# Patient Record
Sex: Female | Born: 1945
Health system: Southern US, Community
[De-identification: ages and names within clinical notes are randomized; demographics above are authoritative.]

## PROBLEM LIST (undated history)

## (undated) DIAGNOSIS — E7800 Pure hypercholesterolemia, unspecified: Secondary | ICD-10-CM

## (undated) DIAGNOSIS — M810 Age-related osteoporosis without current pathological fracture: Secondary | ICD-10-CM

## (undated) DIAGNOSIS — IMO0001 Reserved for inherently not codable concepts without codable children: Secondary | ICD-10-CM

## (undated) DIAGNOSIS — F32A Depression, unspecified: Secondary | ICD-10-CM

## (undated) DIAGNOSIS — F329 Major depressive disorder, single episode, unspecified: Secondary | ICD-10-CM

## (undated) DIAGNOSIS — T4145XA Adverse effect of unspecified anesthetic, initial encounter: Secondary | ICD-10-CM

## (undated) DIAGNOSIS — K219 Gastro-esophageal reflux disease without esophagitis: Secondary | ICD-10-CM

## (undated) DIAGNOSIS — R351 Nocturia: Secondary | ICD-10-CM

## (undated) DIAGNOSIS — R29898 Other symptoms and signs involving the musculoskeletal system: Secondary | ICD-10-CM

## (undated) DIAGNOSIS — E119 Type 2 diabetes mellitus without complications: Secondary | ICD-10-CM

## (undated) DIAGNOSIS — B351 Tinea unguium: Secondary | ICD-10-CM

## (undated) DIAGNOSIS — H3530 Unspecified macular degeneration: Secondary | ICD-10-CM

## (undated) DIAGNOSIS — T8859XA Other complications of anesthesia, initial encounter: Secondary | ICD-10-CM

## (undated) DIAGNOSIS — Z87898 Personal history of other specified conditions: Secondary | ICD-10-CM

## (undated) DIAGNOSIS — Z794 Long term (current) use of insulin: Secondary | ICD-10-CM

## (undated) DIAGNOSIS — L723 Sebaceous cyst: Secondary | ICD-10-CM

## (undated) DIAGNOSIS — M1990 Unspecified osteoarthritis, unspecified site: Secondary | ICD-10-CM

## (undated) DIAGNOSIS — I10 Essential (primary) hypertension: Secondary | ICD-10-CM

## (undated) HISTORY — PX: ORIF WRIST FRACTURE: SHX2133

## (undated) HISTORY — PX: BLADDER SUSPENSION: SHX72

## (undated) HISTORY — PX: CATARACT EXTRACTION W/ INTRAOCULAR LENS  IMPLANT, BILATERAL: SHX1307

## (undated) HISTORY — PX: ABDOMINAL HYSTERECTOMY: SHX81

---

## 2000-01-08 ENCOUNTER — Emergency Department (HOSPITAL_COMMUNITY): Admission: EM | Admit: 2000-01-08 | Discharge: 2000-01-08 | Payer: Self-pay | Admitting: Emergency Medicine

## 2000-01-08 ENCOUNTER — Encounter: Payer: Self-pay | Admitting: Emergency Medicine

## 2000-01-29 ENCOUNTER — Encounter: Admission: RE | Admit: 2000-01-29 | Discharge: 2000-01-29 | Payer: Self-pay | Admitting: Internal Medicine

## 2000-01-29 ENCOUNTER — Encounter: Payer: Self-pay | Admitting: Internal Medicine

## 2000-01-31 ENCOUNTER — Encounter: Admission: RE | Admit: 2000-01-31 | Discharge: 2000-01-31 | Payer: Self-pay | Admitting: Internal Medicine

## 2000-01-31 ENCOUNTER — Encounter: Payer: Self-pay | Admitting: Internal Medicine

## 2002-06-14 ENCOUNTER — Encounter: Admission: RE | Admit: 2002-06-14 | Discharge: 2002-09-12 | Payer: Self-pay | Admitting: Endocrinology

## 2002-08-04 ENCOUNTER — Other Ambulatory Visit: Admission: RE | Admit: 2002-08-04 | Discharge: 2002-08-04 | Payer: Self-pay | Admitting: Obstetrics and Gynecology

## 2003-08-25 ENCOUNTER — Other Ambulatory Visit: Admission: RE | Admit: 2003-08-25 | Discharge: 2003-08-25 | Payer: Self-pay | Admitting: Obstetrics and Gynecology

## 2003-11-15 ENCOUNTER — Ambulatory Visit: Payer: Self-pay | Admitting: Endocrinology

## 2003-12-18 ENCOUNTER — Ambulatory Visit: Payer: Self-pay | Admitting: Endocrinology

## 2004-01-02 ENCOUNTER — Ambulatory Visit: Payer: Self-pay

## 2004-01-17 ENCOUNTER — Ambulatory Visit: Payer: Self-pay | Admitting: Endocrinology

## 2004-01-24 ENCOUNTER — Ambulatory Visit: Payer: Self-pay

## 2004-01-26 ENCOUNTER — Ambulatory Visit: Payer: Self-pay | Admitting: Endocrinology

## 2004-02-08 ENCOUNTER — Ambulatory Visit: Payer: Self-pay

## 2004-02-12 ENCOUNTER — Ambulatory Visit: Payer: Self-pay | Admitting: *Deleted

## 2004-02-22 ENCOUNTER — Ambulatory Visit: Payer: Self-pay | Admitting: Endocrinology

## 2004-04-04 ENCOUNTER — Ambulatory Visit: Payer: Self-pay | Admitting: Endocrinology

## 2004-04-11 ENCOUNTER — Ambulatory Visit: Payer: Self-pay | Admitting: Endocrinology

## 2004-04-15 ENCOUNTER — Ambulatory Visit (HOSPITAL_COMMUNITY): Admission: RE | Admit: 2004-04-15 | Discharge: 2004-04-15 | Payer: Self-pay | Admitting: Endocrinology

## 2004-05-03 ENCOUNTER — Ambulatory Visit: Payer: Self-pay | Admitting: Endocrinology

## 2004-05-07 ENCOUNTER — Ambulatory Visit: Payer: Self-pay | Admitting: Gastroenterology

## 2004-05-07 HISTORY — PX: ESOPHAGOGASTRODUODENOSCOPY: SHX1529

## 2004-05-16 ENCOUNTER — Ambulatory Visit: Payer: Self-pay | Admitting: Endocrinology

## 2004-06-17 ENCOUNTER — Ambulatory Visit: Payer: Self-pay | Admitting: Endocrinology

## 2004-07-08 ENCOUNTER — Ambulatory Visit: Payer: Self-pay | Admitting: Endocrinology

## 2004-08-13 ENCOUNTER — Ambulatory Visit: Payer: Self-pay | Admitting: Endocrinology

## 2004-10-29 ENCOUNTER — Ambulatory Visit: Payer: Self-pay | Admitting: Endocrinology

## 2004-11-13 ENCOUNTER — Ambulatory Visit: Payer: Self-pay | Admitting: Endocrinology

## 2004-11-20 ENCOUNTER — Ambulatory Visit: Payer: Self-pay | Admitting: Cardiology

## 2004-12-03 ENCOUNTER — Ambulatory Visit: Payer: Self-pay | Admitting: Endocrinology

## 2004-12-24 ENCOUNTER — Ambulatory Visit: Payer: Self-pay | Admitting: Endocrinology

## 2005-01-06 ENCOUNTER — Ambulatory Visit: Payer: Self-pay | Admitting: Internal Medicine

## 2005-04-15 ENCOUNTER — Ambulatory Visit: Payer: Self-pay | Admitting: Endocrinology

## 2005-04-18 ENCOUNTER — Ambulatory Visit: Payer: Self-pay | Admitting: Endocrinology

## 2005-06-10 ENCOUNTER — Ambulatory Visit: Payer: Self-pay | Admitting: Endocrinology

## 2005-11-14 ENCOUNTER — Ambulatory Visit: Payer: Self-pay | Admitting: Endocrinology

## 2005-11-14 LAB — CONVERTED CEMR LAB
Albumin: 3.6 g/dL (ref 3.5–5.2)
BUN: 9 mg/dL (ref 6–23)
Bacteria, U Microscopic: NEGATIVE /hpf
Basophils Absolute: 0 10*3/uL (ref 0.0–0.1)
Basophils Relative: 0.3 % (ref 0.0–1.0)
CO2: 30 meq/L (ref 19–32)
Chol/HDL Ratio, serum: 2.9
Creatinine,U: 48.5 mg/dL
Crystals: NEGATIVE
HCT: 36.8 % (ref 36.0–46.0)
HDL: 44.9 mg/dL (ref >39.0)
Hgb A1c MFr Bld: 12.2 % — ABNORMAL HIGH (ref 4.6–6.0)
Ketones, ur: NEGATIVE mg/dL
LDL Cholesterol: 73 mg/dL (ref 0–99)
Leukocytes, UA: NEGATIVE
Lymphocytes Relative: 35.8 % (ref 12.0–46.0)
MCV: 86.9 fL (ref 78.0–100.0)
Microalb Creat Ratio: 14.4 mg/g (ref 0.0–30.0)
Microalb, Ur: 0.7 mg/dL (ref 0.0–1.9)
Monocytes Absolute: 0.7 10*3/uL (ref 0.2–0.7)
Neutro Abs: 6.4 10*3/uL (ref 1.4–7.7)
Nitrite: NEGATIVE
RBC / HPF: NONE SEEN
Sodium: 140 meq/L (ref 135–145)
TSH: 1.09 microintl units/mL (ref 0.35–5.50)
Total Bilirubin: 0.5 mg/dL (ref 0.3–1.2)
Total Protein, Urine: NEGATIVE mg/dL
Total Protein: 6.3 g/dL (ref 6.0–8.3)
Urobilinogen, UA: 0.2 (ref 0.0–1.0)

## 2005-11-26 ENCOUNTER — Ambulatory Visit: Payer: Self-pay | Admitting: Endocrinology

## 2005-12-09 ENCOUNTER — Ambulatory Visit: Payer: Self-pay | Admitting: Internal Medicine

## 2006-02-02 ENCOUNTER — Ambulatory Visit: Payer: Self-pay | Admitting: Endocrinology

## 2006-03-16 ENCOUNTER — Ambulatory Visit: Payer: Self-pay | Admitting: Endocrinology

## 2006-04-14 ENCOUNTER — Ambulatory Visit: Payer: Self-pay | Admitting: Endocrinology

## 2006-06-05 ENCOUNTER — Ambulatory Visit: Payer: Self-pay | Admitting: Endocrinology

## 2006-06-26 ENCOUNTER — Encounter: Payer: Self-pay | Admitting: Endocrinology

## 2006-06-26 DIAGNOSIS — F329 Major depressive disorder, single episode, unspecified: Secondary | ICD-10-CM | POA: Insufficient documentation

## 2006-06-26 DIAGNOSIS — M1990 Unspecified osteoarthritis, unspecified site: Secondary | ICD-10-CM | POA: Insufficient documentation

## 2006-06-26 DIAGNOSIS — K219 Gastro-esophageal reflux disease without esophagitis: Secondary | ICD-10-CM

## 2006-06-26 DIAGNOSIS — I10 Essential (primary) hypertension: Secondary | ICD-10-CM | POA: Insufficient documentation

## 2006-06-26 DIAGNOSIS — M810 Age-related osteoporosis without current pathological fracture: Secondary | ICD-10-CM

## 2006-08-21 ENCOUNTER — Ambulatory Visit: Payer: Self-pay | Admitting: Endocrinology

## 2006-08-29 LAB — CONVERTED CEMR LAB: Pap Smear: NORMAL

## 2006-10-07 ENCOUNTER — Ambulatory Visit: Payer: Self-pay | Admitting: Gastroenterology

## 2006-11-19 ENCOUNTER — Ambulatory Visit: Payer: Self-pay | Admitting: Gastroenterology

## 2006-11-19 ENCOUNTER — Ambulatory Visit: Payer: Self-pay | Admitting: Endocrinology

## 2006-11-23 ENCOUNTER — Telehealth: Payer: Self-pay | Admitting: Endocrinology

## 2006-11-23 LAB — CONVERTED CEMR LAB
AST: 14 units/L (ref 0–37)
Bilirubin, Direct: 0.1 mg/dL (ref 0.0–0.3)
Chloride: 102 meq/L (ref 96–112)
Cholesterol: 216 mg/dL (ref 0–200)
Creatinine, Ser: 0.7 mg/dL (ref 0.4–1.2)
Creatinine,U: 67.7 mg/dL
Direct LDL: 163.1 mg/dL
Eosinophils Absolute: 0.2 10*3/uL (ref 0.0–0.6)
GFR calc non Af Amer: 91 mL/min
HDL: 42.1 mg/dL (ref >39.0)
Hemoglobin, Urine: NEGATIVE
Hemoglobin: 13.4 g/dL (ref 12.0–15.0)
Hgb A1c MFr Bld: 10 % — ABNORMAL HIGH (ref 4.6–6.0)
Lymphocytes Relative: 29.8 % (ref 12.0–46.0)
MCV: 86.3 fL (ref 78.0–100.0)
Microalb, Ur: 1 mg/dL (ref 0.0–1.9)
Mucus, UA: NEGATIVE
Neutro Abs: 7 10*3/uL (ref 1.4–7.7)
Neutrophils Relative %: 62.4 % (ref 43.0–77.0)
Nitrite: NEGATIVE
Platelets: 373 10*3/uL (ref 150–400)
Sodium: 137 meq/L (ref 135–145)
Specific Gravity, Urine: 1.02 (ref 1.000–1.03)
Total CHOL/HDL Ratio: 5.1
Urine Glucose: 250 mg/dL — AB
Urobilinogen, UA: 0.2 (ref 0.0–1.0)
WBC: 11.3 10*3/uL — ABNORMAL HIGH (ref 4.5–10.5)

## 2006-12-01 ENCOUNTER — Encounter: Payer: Self-pay | Admitting: Gastroenterology

## 2006-12-01 ENCOUNTER — Ambulatory Visit: Payer: Self-pay | Admitting: Gastroenterology

## 2006-12-21 ENCOUNTER — Ambulatory Visit: Payer: Self-pay | Admitting: Internal Medicine

## 2006-12-21 DIAGNOSIS — M549 Dorsalgia, unspecified: Secondary | ICD-10-CM | POA: Insufficient documentation

## 2006-12-22 ENCOUNTER — Ambulatory Visit: Payer: Self-pay | Admitting: Gastroenterology

## 2006-12-22 ENCOUNTER — Encounter: Payer: Self-pay | Admitting: Gastroenterology

## 2006-12-22 DIAGNOSIS — E1169 Type 2 diabetes mellitus with other specified complication: Secondary | ICD-10-CM | POA: Insufficient documentation

## 2006-12-22 DIAGNOSIS — K449 Diaphragmatic hernia without obstruction or gangrene: Secondary | ICD-10-CM | POA: Insufficient documentation

## 2006-12-22 DIAGNOSIS — K210 Gastro-esophageal reflux disease with esophagitis: Secondary | ICD-10-CM

## 2006-12-22 DIAGNOSIS — K2270 Barrett's esophagus without dysplasia: Secondary | ICD-10-CM

## 2006-12-22 DIAGNOSIS — J309 Allergic rhinitis, unspecified: Secondary | ICD-10-CM | POA: Insufficient documentation

## 2006-12-22 DIAGNOSIS — D1330 Benign neoplasm of unspecified part of small intestine: Secondary | ICD-10-CM

## 2006-12-22 DIAGNOSIS — G4733 Obstructive sleep apnea (adult) (pediatric): Secondary | ICD-10-CM

## 2006-12-22 DIAGNOSIS — E785 Hyperlipidemia, unspecified: Secondary | ICD-10-CM

## 2007-01-04 ENCOUNTER — Ambulatory Visit: Payer: Self-pay | Admitting: Gastroenterology

## 2007-05-05 ENCOUNTER — Telehealth: Payer: Self-pay | Admitting: Endocrinology

## 2007-08-24 ENCOUNTER — Ambulatory Visit: Payer: Self-pay | Admitting: Internal Medicine

## 2007-10-28 LAB — HM COLONOSCOPY: HM Colonoscopy: NORMAL

## 2007-12-27 ENCOUNTER — Ambulatory Visit: Payer: Self-pay | Admitting: Endocrinology

## 2007-12-28 LAB — CONVERTED CEMR LAB
ALT: 15 units/L (ref 0–35)
Albumin: 3.5 g/dL (ref 3.5–5.2)
BUN: 15 mg/dL (ref 6–23)
Bilirubin Urine: NEGATIVE
Bilirubin, Direct: 0.1 mg/dL (ref 0.0–0.3)
CO2: 24 meq/L (ref 19–32)
Calcium: 9 mg/dL (ref 8.4–10.5)
Creatinine, Ser: 0.5 mg/dL (ref 0.4–1.2)
Creatinine,U: 35.5 mg/dL
Crystals: NEGATIVE
Eosinophils Absolute: 0.2 10*3/uL (ref 0.0–0.7)
Eosinophils Relative: 1.4 % (ref 0.0–5.0)
Glucose, Bld: 394 mg/dL — ABNORMAL HIGH (ref 70–99)
HCT: 42.3 % (ref 36.0–46.0)
Hemoglobin: 14.7 g/dL (ref 12.0–15.0)
Ketones, ur: NEGATIVE mg/dL
Lymphocytes Relative: 31.7 % (ref 12.0–46.0)
MCHC: 34.7 g/dL (ref 30.0–36.0)
Microalb Creat Ratio: 62 mg/g — ABNORMAL HIGH (ref 0.0–30.0)
Microalb, Ur: 2.2 mg/dL — ABNORMAL HIGH (ref 0.0–1.9)
Monocytes Absolute: 0.6 10*3/uL (ref 0.1–1.0)
Neutro Abs: 6.7 10*3/uL (ref 1.4–7.7)
Neutrophils Relative %: 60.4 % (ref 43.0–77.0)
Phosphorus: 3.7 mg/dL (ref 2.3–4.6)
Potassium: 4.1 meq/L (ref 3.5–5.1)
RBC: 4.91 M/uL (ref 3.87–5.11)
RDW: 11.5 % (ref 11.5–14.6)
Sodium: 134 meq/L — ABNORMAL LOW (ref 135–145)
Total Bilirubin: 0.6 mg/dL (ref 0.3–1.2)
Total CHOL/HDL Ratio: 4.5
Triglycerides: 116 mg/dL (ref 0–149)
pH: 5.5 (ref 5.0–8.0)

## 2008-02-23 ENCOUNTER — Ambulatory Visit: Payer: Self-pay | Admitting: Endocrinology

## 2008-02-28 ENCOUNTER — Ambulatory Visit: Payer: Self-pay | Admitting: Internal Medicine

## 2008-02-28 ENCOUNTER — Encounter: Payer: Self-pay | Admitting: Endocrinology

## 2008-03-08 ENCOUNTER — Telehealth: Payer: Self-pay | Admitting: Gastroenterology

## 2008-03-28 ENCOUNTER — Ambulatory Visit: Payer: Self-pay | Admitting: Gastroenterology

## 2008-06-12 ENCOUNTER — Ambulatory Visit: Payer: Self-pay | Admitting: Endocrinology

## 2008-06-19 ENCOUNTER — Telehealth: Payer: Self-pay | Admitting: Endocrinology

## 2008-06-28 ENCOUNTER — Encounter: Payer: Self-pay | Admitting: Endocrinology

## 2008-07-07 ENCOUNTER — Telehealth (INDEPENDENT_AMBULATORY_CARE_PROVIDER_SITE_OTHER): Payer: Self-pay | Admitting: *Deleted

## 2008-07-25 ENCOUNTER — Encounter: Admission: RE | Admit: 2008-07-25 | Discharge: 2008-07-25 | Payer: Self-pay | Admitting: Internal Medicine

## 2008-08-01 ENCOUNTER — Telehealth (INDEPENDENT_AMBULATORY_CARE_PROVIDER_SITE_OTHER): Payer: Self-pay | Admitting: *Deleted

## 2008-08-03 ENCOUNTER — Ambulatory Visit: Payer: Self-pay | Admitting: Endocrinology

## 2008-08-04 LAB — CONVERTED CEMR LAB
Calcium: 9 mg/dL (ref 8.4–10.5)
Magnesium: 2.2 mg/dL (ref 1.5–2.5)

## 2008-08-09 ENCOUNTER — Telehealth (INDEPENDENT_AMBULATORY_CARE_PROVIDER_SITE_OTHER): Payer: Self-pay | Admitting: *Deleted

## 2008-08-18 ENCOUNTER — Ambulatory Visit (HOSPITAL_COMMUNITY): Admission: RE | Admit: 2008-08-18 | Discharge: 2008-08-18 | Payer: Self-pay | Admitting: Endocrinology

## 2008-09-04 ENCOUNTER — Telehealth: Payer: Self-pay | Admitting: Gastroenterology

## 2008-09-11 ENCOUNTER — Ambulatory Visit: Payer: Self-pay | Admitting: Endocrinology

## 2008-12-18 ENCOUNTER — Ambulatory Visit: Payer: Self-pay | Admitting: Endocrinology

## 2008-12-18 DIAGNOSIS — R809 Proteinuria, unspecified: Secondary | ICD-10-CM | POA: Insufficient documentation

## 2008-12-18 LAB — CONVERTED CEMR LAB
Hgb A1c MFr Bld: 14.8 % — ABNORMAL HIGH (ref 4.6–6.5)
Microalb Creat Ratio: 122.1 mg/g — ABNORMAL HIGH (ref 0.0–30.0)

## 2009-03-14 ENCOUNTER — Ambulatory Visit: Payer: Self-pay | Admitting: Endocrinology

## 2009-03-14 ENCOUNTER — Encounter (INDEPENDENT_AMBULATORY_CARE_PROVIDER_SITE_OTHER): Payer: Self-pay | Admitting: *Deleted

## 2009-03-14 LAB — CONVERTED CEMR LAB
AST: 16 units/L (ref 0–37)
BUN: 16 mg/dL (ref 6–23)
Basophils Absolute: 0 10*3/uL (ref 0.0–0.1)
Bilirubin Urine: NEGATIVE
Bilirubin, Direct: 0.1 mg/dL (ref 0.0–0.3)
Chloride: 97 meq/L (ref 96–112)
Cholesterol: 257 mg/dL — ABNORMAL HIGH (ref 0–200)
Direct LDL: 185.6 mg/dL
Eosinophils Absolute: 0.1 10*3/uL (ref 0.0–0.7)
Eosinophils Relative: 1 % (ref 0.0–5.0)
GFR calc non Af Amer: 90.15 mL/min (ref >60)
HDL: 51.8 mg/dL (ref >39.00)
Hemoglobin: 13.1 g/dL (ref 12.0–15.0)
Leukocytes, UA: NEGATIVE
Lymphocytes Relative: 31.8 % (ref 12.0–46.0)
MCV: 88.4 fL (ref 78.0–100.0)
Monocytes Relative: 7 % (ref 3.0–12.0)
Neutro Abs: 6.5 10*3/uL (ref 1.4–7.7)
Nitrite: NEGATIVE
Platelets: 377 10*3/uL (ref 150.0–400.0)
Potassium: 4.8 meq/L (ref 3.5–5.1)
Sodium: 133 meq/L — ABNORMAL LOW (ref 135–145)
TSH: 1.82 microintl units/mL (ref 0.35–5.50)
Total CHOL/HDL Ratio: 5
Total Protein, Urine: NEGATIVE mg/dL
Urine Glucose: >=1000 mg/dL
Urobilinogen, UA: 0.2 (ref 0.0–1.0)
VLDL: 27.8 mg/dL (ref 0.0–40.0)
WBC: 10.7 10*3/uL — ABNORMAL HIGH (ref 4.5–10.5)

## 2009-03-19 ENCOUNTER — Ambulatory Visit: Payer: Self-pay | Admitting: Endocrinology

## 2009-04-18 ENCOUNTER — Ambulatory Visit: Payer: Self-pay | Admitting: Endocrinology

## 2009-06-07 ENCOUNTER — Telehealth: Payer: Self-pay | Admitting: Endocrinology

## 2009-06-13 ENCOUNTER — Telehealth: Payer: Self-pay | Admitting: Endocrinology

## 2009-06-18 ENCOUNTER — Observation Stay (HOSPITAL_COMMUNITY): Admission: EM | Admit: 2009-06-18 | Discharge: 2009-06-18 | Payer: Self-pay | Admitting: Emergency Medicine

## 2009-06-20 ENCOUNTER — Ambulatory Visit: Payer: Self-pay | Admitting: Endocrinology

## 2009-07-12 ENCOUNTER — Telehealth (INDEPENDENT_AMBULATORY_CARE_PROVIDER_SITE_OTHER): Payer: Self-pay | Admitting: *Deleted

## 2009-07-18 ENCOUNTER — Ambulatory Visit: Payer: Self-pay | Admitting: Endocrinology

## 2009-07-23 ENCOUNTER — Ambulatory Visit: Payer: Self-pay | Admitting: Internal Medicine

## 2009-07-23 LAB — CONVERTED CEMR LAB
Blood Glucose, Fingerstick: 97
Hgb A1c MFr Bld: 12.7 % — ABNORMAL HIGH (ref 4.6–6.5)

## 2009-10-11 ENCOUNTER — Encounter (INDEPENDENT_AMBULATORY_CARE_PROVIDER_SITE_OTHER): Payer: Self-pay | Admitting: *Deleted

## 2009-12-10 ENCOUNTER — Telehealth (INDEPENDENT_AMBULATORY_CARE_PROVIDER_SITE_OTHER): Payer: Self-pay | Admitting: *Deleted

## 2010-01-07 ENCOUNTER — Telehealth: Payer: Self-pay | Admitting: Endocrinology

## 2010-02-17 ENCOUNTER — Encounter: Payer: Self-pay | Admitting: Gastroenterology

## 2010-02-26 NOTE — Assessment & Plan Note (Signed)
Summary: 2 MTH FU--STC   Vital Signs:  Patient profile:   65 year old female Height:      62 inches (157.48 cm) Weight:      139.38 pounds (63.35 kg) BMI:     25.59 O2 Sat:      97 % on Room air Temp:     98.6 degrees F (37.00 degrees C) oral Pulse rate:   94 / minute BP sitting:   142 / 84  (left arm) Cuff size:   regular  Vitals Entered By: Josph Macho RMA (Jun 20, 2009 8:16 AM)  O2 Flow:  Room air CC: 2 month follow up/ CF Is Patient Diabetic? Yes   Primary Provider:  Romero Belling, MD  CC:  2 month follow up/ CF.  History of Present Illness: pt was seen in er 2 days ago for severe hyperglycemia.  she has lost a few more lbs.  she says she sometimes skips insulin. she also had a few minutes of severe involuntary twisting of the neck and associated eyes rolling back.   she says she was not taking any medication for nausea, but 2 weeks ago she was rx'ed an uncertain medication (for "nerves") for neck pain, at [facility].  she no longer takes this med. she also continues to have depression, despite the effexor.      Current Medications (verified): 1)  Glucophage 1000 Mg Tabs (Metformin Hcl) .... Take 2 Tablets By Mouth At Bedtime, For Diabetes 2)  Oxybutynin Chloride 5 Mg Tabs (Oxybutynin Chloride) .Marland Kitchen.. 1 By Mouth Two Times A Day, For Urinary Leakage 3)  Effexor Xr 75 Mg  Cp24 (Venlafaxine Hcl) .... Take 3 Tablet By Mouth Once Daily, For Depression 4)  Zegerid 40-1100 Mg  Caps (Omeprazole-Sodium Bicarbonate) .... Take 1 Tablet By Mouth Two Times A Day, For Heartburn 5)  Lantus Solostar 100 Unit/ml Soln (Insulin Glargine) .... 80 Units Once Daily, and Pen Needles Once Daily 6)  Venlafaxine Hcl 150 Mg Xr24h-Tab (Venlafaxine Hcl) .... 2 Qd 7)  Crestor 40 Mg Tabs (Rosuvastatin Calcium) .Marland Kitchen.. 1 Qd 8)  Losartan Potassium 50 Mg Tabs (Losartan Potassium) .Marland Kitchen.. 1 Once Daily.  Cancel Rx For Labetalol Just Sent 9)  Onetouch Ultra Test  Strp (Glucose Blood) .... Two Times A Day, and  Lancets 250.01  Allergies (verified): 1)  ! 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         denies suicidal thoughts.    Physical Exam  General:  normal appearance.   Psych:  depressed affect.     Impression & Recommendations:  Problem # 1:  DIABETES MELLITUS, TYPE II (ICD-250.00) therapy limited by noncompliance.  i'll do the best i can.  Problem # 2:  acute dystonia uncertain etiology.  resolved.  Problem # 3:  DEPRESSION (ICD-311) needs increased rx  Other Orders: Psychiatric Referral (Psych) Est. Patient Level IV (04540)  Patient Instructions: 1)  it is critically important to take insulin and other medications as prescribed. 2)  check your blood sugar 2 times a day.  vary the time of day when you check, between before the 3 meals, and at bedtime.  also check if you have symptoms of your blood sugar being too high or too low.  please keep a  record of the readings and bring it to your next appointment here.  please call us sooner if you are having low blood sugar episodes. 3)  refer psychiatry.  you will be called with a day and time for an appointment. 4)  return here 3 weeks. 5)  i'll do off work form.  she missed from 06/18/09, and return 06/26/09.  she missed parts of days 06/11/09 to 06/18/09.

## 2010-02-26 NOTE — Letter (Signed)
Summary: Endo/Colon Letter  [facility]  [address] [place], [place] [address]   Phone: [phone]  Fax: [phone]      October 11, 2009 MRN: [id number]   [facility] [address] [place], [place]  [address]   Dear Ms. [facility],   According to your medical record, it is time for you to schedule an Endoscopy/Colonoscopy . Endoscopic screeening is recommended for patients with certain upper digestive tract conditions because of associated increased risk for cancers of the upper digestive system. The American Cancer Society recommends Colonoscopy as a method to detect early colon cancer. Patients with a family history of colon cancer, or a personal history of colon polyps or inflammatory bowel disease are at increased risk.  This letter has been generated based on the recommendations made at the time of your prior procedure. If you feel that in your particular situation this may no longer apply, please contact our office.  Please call our office at [phone] to schedule this appointment or to update your records at your earliest convenience.  Thank you for cooperating with [place] to provide you with the very best care possible.   Sincerely,  Judie Petit T. Russella Dar, M.D.  [facility] Gastroenterology Division [phone]

## 2010-02-26 NOTE — Assessment & Plan Note (Signed)
Summary: 3 MTH PHYSICAL--STC   Vital Signs:  Patient profile:   65 year old female Height:      62 inches (157.48 cm) Weight:      141.25 pounds (64.20 kg) O2 Sat:      95 % on Room air Temp:     97.1 degrees F (36.17 degrees C) oral Pulse rate:   92 / minute BP sitting:   148 / 80  (left arm) Cuff size:   regular  Vitals Entered By: Josph Macho RMA (March 19, 2009 7:59 AM)  O2 Flow:  Room air CC: 3 month physical/ CF Is Patient Diabetic? Yes   Primary Provider:  Romero Belling, MD  CC:  3 month physical/ CF.  History of Present Illness: here for regular wellness examination.  He's feeling pretty well in general, and does not drink etoh.   Current Medications (verified): 1)  Glucophage 1000 Mg Tabs (Metformin Hcl) .... Take 2 Tablets By Mouth At Bedtime, For Diabetes 2)  Labetalol Hcl 100 Mg Tabs (Labetalol Hcl) .... 1/2 By Mouth Two Times A Day, For Blood Pressure 3)  Oxybutynin Chloride 5 Mg Tabs (Oxybutynin Chloride) .Marland Kitchen.. 1 By Mouth Two Times A Day, For Urinary Leakage 4)  Vytorin 10-80 Mg Tabs (Ezetimibe-Simvastatin) .Marland Kitchen.. 1 By Mouth At Bedtime, For Cholesterol 5)  Effexor Xr 75 Mg  Cp24 (Venlafaxine Hcl) .... Take 3 Tablet By Mouth Once Daily, For Depression 6)  Zegerid 40-1100 Mg  Caps (Omeprazole-Sodium Bicarbonate) .... Take 1 Tablet By Mouth Two Times A Day, For Heartburn 7)  Lantus Solostar 100 Unit/ml Soln (Insulin Glargine) .... 65 Units Once Daily, For Diabetes 8)  Venlafaxine Hcl 150 Mg Xr24h-Tab (Venlafaxine Hcl) .... 2 Qd  Allergies (verified): 1)  ! Novocain  Family History: Reviewed history from 03/28/2008 and no changes required. Family History Diabetes 1st degree relative: Sister x 2, Brother   Family History High cholesterol Family History Hypertension Family History of Stroke M 1st degree relative - father Family History of Stroke F 1st degree relative <60 - sister mother had "female cancer" No FH of Colon Cancer: Family History of Ovarian  Cancer: Mother  Social History: Reviewed history from 06/12/2008 and no changes required. Current Smoker-10 cigarettes daily Alcohol use-no widowed Daily Caffeine Use-2 cups daily or more Illicit Drug Use - no Patient does not get regular exercise.  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  Review of Systems  The patient denies fever, weight loss, weight gain, decreased hearing, chest pain, syncope, prolonged cough, headaches, abdominal pain, melena, hematochezia, severe indigestion/heartburn, hematuria, suspicious skin lesions, and depression.    Physical Exam  General:  normal appearance.   Head:  head: no deformity eyes: no periorbital swelling, no proptosis external nose and ears are normal mouth: no lesion seen Neck:  Supple without thyroid enlargement or tenderness.  Breasts:  sees gyn  Heart:  Regular rate and rhythm without murmurs or gallops noted. Normal S1,S2.   Abdomen:  abdomen is soft, nontender.  no hepatosplenomegaly.   not distended.  no hernia  Rectal:  sees gyn  Genitalia:  sees gyn  Msk:  muscle bulk and strength are grossly normal.  no obvious joint swelling.  gait is normal and steady  Pulses:  dorsalis pedis intact bilat.  no carotid bruit  Neurologic:  cn 2-12 grossly intact.   readily moves all 4's.   sensation is intact to touch on the feet  Skin:  normal texture and temp.  no rash.  not diaphoretic  Cervical Nodes:  No significant adenopathy.  Psych:  Alert and cooperative; normal mood and affect; normal attention span and concentration.   Additional Exam:  SEPARATE EVALUATION FOLLOWS--EACH PROBLEM HERE IS NEW, NOT RESPONDING TO TREATMENT, OR POSES SIGNIFICANT RISK TO THE PATIENT'S HEALTH: HISTORY OF THE PRESENT ILLNESS: no cbg record, but states cbg's are "high."  she has slight blurry vision pt says she takes and tolerates vytorin well. she has slight wheezing. PAST MEDICAL HISTORY reviewed and up to date today REVIEW OF SYSTEMS: denies  hypoglycemia and doe PHYSICAL EXAMINATION: see vs page no deformity.  no ulcer on the feet.  feet are of normal color and temp.  no edema chest: has few exp wheezes LAB/XRAY RESULTS: Hemoglobin A1C   13.7 %                    Cholesterol LDL   185.6 mg/dL IMPRESSION: dyslipidemia, needs increased rx wheezing is prob exacerbated by labetalol dm, needs increased rx htn, needs increased rx PLAN: see instruction sheet   Impression & Recommendations:  Problem # 1:  ROUTINE GENERAL MEDICAL EXAM@HEALTH  CARE FACL (ICD-V70.0)  Medications Added to Medication List This Visit: 1)  Labetalol Hcl 100 Mg Tabs (Labetalol hcl) .Marland Kitchen.. 1 by mouth two times a day, for blood pressure 2)  Lantus Solostar 100 Unit/ml Soln (Insulin glargine) .... 80 units once daily, for diabetes 3)  Crestor 40 Mg Tabs (Rosuvastatin calcium) .Marland Kitchen.. 1 qd 4)  Losartan Potassium 50 Mg Tabs (Losartan potassium) .Marland Kitchen.. 1 once daily.  cancel rx for labetalol just sent  Other Orders: EKG w/ Interpretation (93000) Est. Patient Level IV (25366) Est. Patient 40-64 years (44034)  Preventive Care Screening     gyn is dr Tenny Craw, who does mammography   Patient Instructions: 1)  increase lantus to 80 units once daily 2)  check your blood sugar 2 times a day.  vary the time of day when you check, between before the 3 meals, and at bedtime.  also check if you have symptoms of your blood sugar being too high or too low.  please keep a record of the readings and bring it to your next appointment here.  please call us sooner if you are having low blood sugar episodes. 3)  Please schedule a follow-up appointment in 1 month. 4)  change vytorin to crestor 40 mg once daily 5)  stop labetalol. 6)  start losartan 50 mg once daily. 7)  we discussed the recommendations of the preventive services task force. Prescriptions: LOSARTAN POTASSIUM 50 MG TABS (LOSARTAN POTASSIUM) 1 once daily.  cancel rx for labetalol just sent  #30 x 11   Entered and  Authorized by:   Minus Breeding MD   Signed by:   Minus Breeding MD on 03/19/2009   Method used:   Electronically to        [facility]  [address] [id number] (retail)       [address]       [place], [place]  [address]       Ph: [phone] or [phone]       Fax: [phone]   RxID:   [id number] LABETALOL HCL 100 MG TABS (LABETALOL HCL) 1 by mouth two times a day, for blood pressure  #60 x 11   Entered and Authorized by:   Minus Breeding MD   Signed by:   Minus Breeding MD on 03/19/2009   Method used:  Electronically to        [facility]  [address] [id number] ([facility]ail[facility]       [address]       [place], [place]  [address]       Ph: [phone] or [phone]       Fax: [phone]   RxID:   [id number] CRESTOR 40 MG TABS (ROSUVASTATIN CALCIUM) 1 qd  #30 x 11   Entered and Authorized by:   Minus Breeding MD   Signed by:   Minus Breeding MD on 03/19/2009   Method used:   [facility]ically to        [facility]  [address] [id number]       [address]       [place], [place]  [address]       Ph: [phone] or [phone]       Fax: [phone]   RxID:   [id number]    Preventive Care Screening     gyn is dr Tenny Craw, who does mammography

## 2010-02-26 NOTE — Letter (Signed)
Summary: Clearance/[facility]  [facility]   Imported By: Lester Glenshaw 07/25/2009 11:21:21  _____________________________________________________________________  External Attachment:    Type:   Image     Comment:   External Document

## 2010-02-26 NOTE — Progress Notes (Signed)
  Phone Note Other Incoming   Request: Send information Summary of Call: patient records request forwarded to [employer].

## 2010-02-26 NOTE — Letter (Signed)
Summary: Office Visit Letter  [facility]  [address] [place], [place] [address]   Phone: [phone]  Fax: [phone]      March 14, 2009 MRN: [id number]   [facility] [address] [place], [place]  [address]   Dear Ms. [place],   According to our records, it is time for you to schedule a follow-up office visit with [place].   At your convenience, please call [phone] (option #2)to schedule an office visit. If you have any questions, concerns, or feel that this letter is in error, we would appreciate your call.   Sincerely,  Judie Petit T. Russella Dar, M.D.  [facility] Gastroenterology Division [phone]

## 2010-02-26 NOTE — Progress Notes (Signed)
  Phone Note Other Incoming   Request: Send information Summary of Call: Request for records received from [facility]. Request forwarded to [employer].     

## 2010-02-26 NOTE — Assessment & Plan Note (Signed)
Summary: 1 MTH FU---STC   Vital Signs:  Patient profile:   65 year old female Height:      62 inches (157.48 cm) Weight:      147.50 pounds (67.05 kg) O2 Sat:      95 % on Room air Temp:     97.3 degrees F (36.28 degrees C) oral Pulse rate:   84 / minute BP sitting:   138 / 60  (left arm) Cuff size:   regular  Vitals Entered By: Josph Macho RMA (April 18, 2009 8:06 AM)  O2 Flow:  Room air CC: 1 month follow up/ CF Is Patient Diabetic? Yes   Primary Provider:  Romero Belling, MD  CC:  1 month follow up/ CF.  History of Present Illness: the status of 3 chronic medical problems is addressed today: dm:  pt says she has had only 1 episode of hypoglycemia since last ov, and this was mild.  it was in the early am, after her overnight  shift. htn:  she takes and tolerates bp meds as rx'ed. dyslipidemia:  she takes crestor as rx'ed.  Current Medications (verified): 1)  Glucophage 1000 Mg Tabs (Metformin Hcl) .... Take 2 Tablets By Mouth At Bedtime, For Diabetes 2)  Oxybutynin Chloride 5 Mg Tabs (Oxybutynin Chloride) .Marland Kitchen.. 1 By Mouth Two Times A Day, For Urinary Leakage 3)  Vytorin 10-80 Mg Tabs (Ezetimibe-Simvastatin) .Marland Kitchen.. 1 By Mouth At Bedtime, For Cholesterol 4)  Effexor Xr 75 Mg  Cp24 (Venlafaxine Hcl) .... Take 3 Tablet By Mouth Once Daily, For Depression 5)  Zegerid 40-1100 Mg  Caps (Omeprazole-Sodium Bicarbonate) .... Take 1 Tablet By Mouth Two Times A Day, For Heartburn 6)  Lantus Solostar 100 Unit/ml Soln (Insulin Glargine) .... 80 Units Once Daily, For Diabetes 7)  Venlafaxine Hcl 150 Mg Xr24h-Tab (Venlafaxine Hcl) .... 2 Qd 8)  Crestor 40 Mg Tabs (Rosuvastatin Calcium) .Marland Kitchen.. 1 Qd 9)  Losartan Potassium 50 Mg Tabs (Losartan Potassium) .Marland Kitchen.. 1 Once Daily.  Cancel Rx For Labetalol Just Sent  Allergies (verified): 1)  ! Novocain  Past History:  Past Medical History: Last updated: 03/28/2008 Depression Hypertension Osteoporosis Osteoarthritis Diabetes mellitus, type  II retinopathy OSA Allergic rhinitis Hyperlipidemia GERD with EE Barrett's espohagus without dysplasia Duodenal tubulovillous adenomatous polyp, 11/2006 Hiatal hernia  Review of Systems  The patient denies syncope, weight loss, and weight gain.    Physical Exam  General:  no distress  Extremities:  no edema Skin:  insulin injection sites at anterior abdomen are normal    Impression & Recommendations:  Problem # 1:  DIABETES MELLITUS, TYPE II (ICD-250.00) apparently well-controlled  Problem # 2:  HYPERLIPIDEMIA (ICD-272.4) she now tolerates crestor well  Problem # 3:  HYPERTENSION (ICD-401.9) well-controlled  Medications Added to Medication List This Visit: 1)  Lantus Solostar 100 Unit/ml Soln (Insulin glargine) .... 80 units once daily, and pen needles once daily 2)  Onetouch Ultra Test Strp (Glucose blood) .... Two times a day, and lancets 250.01  Other Orders: Est. Patient Level IV ([address])  Patient Instructions: 1)  continue lantus to 80 units once daily, and other meds the same. 2)  check your blood sugar 2 times a day.  vary the time of day when you check, between before the 3 meals, and at bedtime.  also check if you have symptoms of your blood sugar being too high or too low.  please keep a record of the readings and bring it to your next appointment here.  please call  us sooner if you are having low blood sugar episodes. 3)  Please schedule a follow-up appointment in 2 months. Prescriptions: ONETOUCH ULTRA TEST  STRP (GLUCOSE BLOOD) two times a day, and lancets 250.01  #100 x 11   Entered and Authorized by:   Minus Breeding MD   Signed by:   Minus Breeding MD on 04/18/2009   Method used:   Electronically to        [facility] [address] [id number]* (retail)       [address] [place], [place]  [address]       Ph: [phone]       Fax: [phone]   RxID:   [id number] LANTUS SOLOSTAR 100 UNIT/ML SOLN (INSULIN GLARGINE) 80 units once daily, and  pen needles once daily  #2 boxes x 11   Entered and Authorized by:   Minus Breeding MD   Signed by:   Minus Breeding MD on 04/18/2009   Method used:   Electronically to        [facility] [address] [id number]* (retail)       [address] [place], [place]  [address]       Ph: [phone]       Fax: [phone]   RxID:   [id number]

## 2010-02-26 NOTE — Assessment & Plan Note (Signed)
Summary: PER MD D/T--SURGERY CLEARANCE--STC   Vital Signs:  Patient profile:   65 year old female Height:      62 inches (157.48 cm) Weight:      137.75 pounds (62.61 kg) BMI:     25.29 O2 Sat:      97 % on Room air Temp:     99.1 degrees F (37.28 degrees C) oral Pulse rate:   89 / minute Pulse rhythm:   regular BP sitting:   110 / 60  (left arm) Cuff size:   regular  Vitals Entered By: Brenton Grills MA (July 18, 2009 2:47 PM)  O2 Flow:  Room air CC: surgery clearance/aj   Primary Provider:  Romero Belling, MD  CC:  surgery clearance/aj.  History of Present Illness: pt stopped her insulin because she ran out of pen needles.   no cbg record, but states cbg's are 300's.   weight loss persists.    Current Medications (verified): 1)  Glucophage 1000 Mg Tabs (Metformin Hcl) .... Take 2 Tablets By Mouth At Bedtime, For Diabetes 2)  Oxybutynin Chloride 5 Mg Tabs (Oxybutynin Chloride) .Marland Kitchen.. 1 By Mouth Two Times A Day, For Urinary Leakage 3)  Zegerid 40-1100 Mg  Caps (Omeprazole-Sodium Bicarbonate) .... Take 1 Tablet By Mouth Two Times A Day, For Heartburn 4)  Lantus Solostar 100 Unit/ml Soln (Insulin Glargine) .... 80 Units Once Daily, and Pen Needles Once Daily 5)  Venlafaxine Hcl 150 Mg Xr24h-Tab (Venlafaxine Hcl) .... 2 Qd 6)  Crestor 40 Mg Tabs (Rosuvastatin Calcium) .Marland Kitchen.. 1 Qd 7)  Losartan Potassium 50 Mg Tabs (Losartan Potassium) .Marland Kitchen.. 1 Once Daily.  Cancel Rx For Labetalol Just Sent 8)  Onetouch Ultra Test  Strp (Glucose Blood) .... Two Times A Day, and Lancets 250.01  Allergies (verified): 1)  ! Novocain  Past History:  Past Medical History: Last updated: 03/28/2008 Depression Hypertension Osteoporosis Osteoarthritis Diabetes mellitus, type II retinopathy OSA Allergic rhinitis Hyperlipidemia GERD with EE Barrett's espohagus without dysplasia Duodenal tubulovillous adenomatous polyp, 11/2006 Hiatal hernia  Review of Systems  The patient denies hypoglycemia,  chest pain, syncope, and dyspnea on exertion.    Physical Exam  General:  normal appearance.   Lungs:  Clear to auscultation bilaterally. Normal respiratory effort.  Heart:  Regular rate and rhythm without murmurs or gallops noted. Normal S1,S2.   Msk:  gait is normal and steady  Psych:  depressed affect.     Impression & Recommendations:  Problem # 1:  DIABETES MELLITUS, TYPE II (ICD-250.00) needs increased rx therapy limited by noncompliance.  i'll do the best i can.  Medications Added to Medication List This Visit: 1)  Venlafaxine Hcl 150 Mg Xr24h-tab (Venlafaxine hcl) .... 2 once daily 2)  Crestor 40 Mg Tabs (Rosuvastatin calcium) .Marland Kitchen.. 1 once daily  Other Orders: Est. Patient Level III ([address])  Patient Instructions: 1)  here are some samples of insulin vials, syringes, and pen needles.  please call the pharmacy if you get low on insulin or related supplies.  it is very important to your health that you do not run out of these.   2)  return here 5 days, any dr, for surgical clearance.

## 2010-02-26 NOTE — Assessment & Plan Note (Signed)
Summary: DR SAE PT PER INSTRUCT SHEET-5 DYS SURG CLEARANCE/ANY MD-STC   Vital Signs:  Patient profile:   65 year old female Height:      62 inches (157.48 cm) Weight:      143.4 pounds (65.18 kg) O2 Sat:      97 % on Room air Temp:     98.2 degrees F (36.78 degrees C) oral Pulse rate:   95 / minute BP sitting:   132 / 72  (left arm) Cuff size:   regular  Vitals Entered By: Orlan Leavens (July 23, 2009 8:59 AM)  O2 Flow:  Room air CC: Medical Clearance for cataract suregery Is Patient Diabetic? Yes Did you bring your meter with you today? Yes Pain Assessment Patient in pain? no      CBG Result 97 CBG Device ID Pt check this am at home   Primary Care Provider:  Romero Belling, MD  CC:  Medical Clearance for cataract suregery.  History of Present Illness: here for med clearance for cataract surg - inpt procedure planned -  no cp, syncope, no leg/feet swelling  uncontrolled DM2 - reports compliance with ongoing medical treatment since OV last week and no changes in medication dose or frequency. denies adverse side effects related to current therapy.  reports AM sugars now <120, highest 280 midday - checks sugars 2x/day - no hypoglycemia symptoms or events - home cbgs reviewed  htn - reports compliance with ongoing medical treatment and no changes in medication dose or frequency. denies adverse side effects related to current therapy. no CP or HA, no vision changes  gerd - reports compliance with ongoing medical treatment and no changes in medication dose or frequency. denies adverse side effects related to current therapy. no reflux or abd pain  Clinical Review Panels:  Prevention   Last Mammogram:  normal (10/28/2007)   Last Pap Smear:  normal (08/29/2006)  Diabetes Management   HgBA1C:  13.7 (03/14/2009)   Creatinine:  0.7 (03/14/2009)   Last Flu Vaccine:  Historical (11/28/2008)   Last Pneumovax:  Pneumovax (11/15/2002)   Current Medications (verified): 1)   Glucophage 1000 Mg Tabs (Metformin Hcl) .... Take 2 Tablets By Mouth At Bedtime, For Diabetes 2)  Oxybutynin Chloride 5 Mg Tabs (Oxybutynin Chloride) .Marland Kitchen.. 1 By Mouth Two Times A Day, For Urinary Leakage 3)  Zegerid 40-1100 Mg  Caps (Omeprazole-Sodium Bicarbonate) .... Take 1 Tablet By Mouth Two Times A Day, For Heartburn 4)  Lantus Solostar 100 Unit/ml Soln (Insulin Glargine) .... 80 Units Once Daily, and Pen Needles Once Daily 5)  Venlafaxine Hcl 150 Mg Xr24h-Tab (Venlafaxine Hcl) .... 2 Once Daily 6)  Crestor 40 Mg Tabs (Rosuvastatin Calcium) .Marland Kitchen.. 1 Once Daily 7)  Losartan Potassium 50 Mg Tabs (Losartan Potassium) .Marland Kitchen.. 1 Once Daily.  Cancel Rx For Labetalol Just Sent 8)  Onetouch Ultra Test  Strp (Glucose Blood) .... Two Times A Day, and Lancets 250.01  Allergies (verified): 1)  ! 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    Physical Exam  General:  alert, well-developed, well-nourished, and cooperative to examination.    Lungs:  normal respiratory effort, no intercostal retractions  or use of accessory muscles; normal breath sounds bilaterally - no crackles and no wheezes.    Heart:  normal rate, regular rhythm, no murmur, and no rub. BLE without edema. normal DP pulses and normal cap refill in all 4 extremities    Abdomen:  soft, non-tender, normal bowel sounds, no distention; no masses and no appreciable hepatomegaly or splenomegaly.   Neurologic:  cn 2-12 grossly intact.   readily moves all 4's.   sensation is intact to  touch on the feet  Psych:  Oriented X3, memory intact for recent and remote, normally interactive, good eye contact, not anxious appearing, not depressed appearing, and not agitated.       Impression & Recommendations:  Problem # 1:  PREOPERATIVE EXAMINATION (ICD-V72.84) cataract surg planned for tomorrow -  EKG w/o ischemic change - no hx angina or syncope - This patient has been evaluated and it is felt that the surgical risk is low and outweighed by the potential benefit of the surgery. Therefore, medically clear to proceed when scheduling allows.   Problem # 2:  DIABETES MELLITUS, TYPE II (ICD-250.00)  improved in last 5 days with improved med access (samples given last week) and subsquent compliance - cont same - check a1c for new baseline - Her updated medication list for this problem includes:    Glucophage 1000 Mg Tabs (Metformin hcl) .Marland Kitchen... Take 2 tablets by mouth at bedtime, for diabetes    Lantus Solostar 100 Unit/ml Soln (Insulin glargine) .Marland KitchenMarland KitchenMarland KitchenMarland Kitchen 70 units subcutaneously two times a day    Losartan Potassium 50 Mg Tabs (Losartan potassium) .Marland Kitchen... 1 once daily.  cancel rx for labetalol just sent  Orders: TLB-A1C / Hgb A1C (Glycohemoglobin) (83036-A1C)  Labs Reviewed: Creat: 0.7 (03/14/2009)    Reviewed HgBA1c results: 13.7 (03/14/2009)  14.8 (12/18/2008)  Problem # 3:  HYPERTENSION (ICD-401.9)  Her updated medication list for this problem includes:    Losartan Potassium 50 Mg Tabs (Losartan potassium) .Marland Kitchen... 1 once daily.  cancel rx for labetalol just sent  Orders: EKG w/ Interpretation (93000)  BP today: 132/72 Prior BP: 110/60 (07/18/2009)  Labs Reviewed: K+: 4.8 (03/14/2009) Creat: : 0.7 (03/14/2009)   Chol: 257 (03/14/2009)   HDL: 51.80 (03/14/2009)   LDL: DEL (12/27/2007)   TG: 139.0 (03/14/2009)  Problem # 4:  GERD (ICD-530.81)  Her updated medication list for this problem includes:    Zegerid 40-1100 Mg Caps (Omeprazole-sodium bicarbonate) .Marland Kitchen... Take 1  tablet by mouth two times a day, for heartburn  EGD: Findings: Esophagitis, Barrett's, Hiatal hernia Location: [facility]   (12/01/2006)  Labs Reviewed: Hgb: 13.1 (03/14/2009)   Hct: 39.8 (03/14/2009)  Complete Medication List: 1)  Glucophage 1000 Mg Tabs (Metformin hcl) .... Take 2 tablets by mouth at bedtime, for diabetes 2)  Oxybutynin Chloride 5 Mg Tabs (Oxybutynin chloride) .Marland Kitchen.. 1 by mouth two times a day, for urinary leakage 3)  Zegerid 40-1100 Mg Caps (Omeprazole-sodium bicarbonate) .... Take 1 tablet by mouth two times a day, for heartburn 4)  Lantus Solostar 100 Unit/ml Soln (Insulin glargine) .... 70 units subcutaneously two times a day 5)  Venlafaxine Hcl 150 Mg Xr24h-tab (Venlafaxine hcl) .... 2 once daily 6)  Crestor 40 Mg Tabs (Rosuvastatin calcium) .Marland Kitchen.. 1 once daily 7)  Losartan Potassium 50 Mg Tabs (Losartan potassium) .Marland Kitchen.. 1 once daily.  cancel rx for labetalol just sent 8)  Onetouch Ultra Test Strp (Glucose blood) .... Two times a day, and lancets 250.01  Patient Instructions: 1)  it was  good to see you today.  2)  ok for surgery tomorrow - form signed - copy given to you and will be faxed to your eye doctor 3)  no medicine changes recommeneded - 4)  test ordered today - a1c - your results will be posted on the phone tree for review in 48-72 hours from the time of test completion; call [phone] and enter your 9 digit MRN (listed above on this page, just below your name); if any changes need to be made or there are abnormal results, you will be contacted directly.  5)  Please schedule a follow-up appointment in 3 months with dr. Everardo All, sooner if problems.

## 2010-02-26 NOTE — Progress Notes (Signed)
  Phone Note Refill Request Message from:  Fax from Pharmacy on Jun 07, 2009 11:00 AM  Refills Requested: Medication #1:  ZEGERID 40-1100 MG  CAPS Take 1 tablet by mouth two times a day   Dosage confirmed as above?Dosage Confirmed pt wants RX sent to [facility] [address]  Initial call taken by: Josph Macho RMA,  Jun 07, 2009 11:00 AM    Prescriptions: ZEGERID 40-1100 MG  CAPS (OMEPRAZOLE-SODIUM BICARBONATE) Take 1 tablet by mouth two times a day, for heartburn  #30 x 4   Entered by:   Josph Macho RMA   Authorized by:   Minus Breeding MD   Signed by:   Josph Macho RMA on 06/07/2009   Method used:   Electronically to        [facility] [address] [id number]* (retail)       [address] [place], [place]  [address]       Ph: [phone]       Fax: [phone]   RxID:   [id number]

## 2010-02-26 NOTE — Progress Notes (Signed)
Summary: Work note  Phone Note Call from Patient Call back at [employer] [phone]   Caller: Patient Summary of Call: pt called requesting a work note excusing her from work. Pt stated that she has been out of work since 06/07/2009 for neck and eye pain. Pt says she was seen at local UC but they would not provide work note. I advised pt that she would have needed to be seen and evaluated by SAE in order for him to cerify her illness and therefore he would not provide note. Pt then said "whatever then!" and hund up the phone. Initial call taken by: Margaret Pyle, CMA,  Jun 13, 2009 11:09 AM

## 2010-02-28 NOTE — Progress Notes (Signed)
Summary: MRI 2006  ---- Converted from flag ---- ---- 01/07/2010 9:10 AM, Margaret Pyle, CMA wrote: left message on pt's home phone to return my call  ---- 01/04/2010 6:27 PM, Lamar Sprinkles, CMA wrote: Vm left at 2:45 Friday [facility] [phone]. Left mess she has been Everardo All patient & has question regarding MRI. Can't find in EMR, please call Monday for further info  [facility] ------------------------------  Phone Note Outgoing Call   Call placed by: Margaret Pyle, CMA,  January 07, 2010 2:32 PM Call placed to: Patient Summary of Call: Pt stated that she wanted copy of MRI done by Dr Audrie Lia office in 2006 but has since contacted their office directly to request copy of MRI Initial call taken by: Margaret Pyle, CMA,  January 07, 2010 2:32 PM

## 2010-04-15 LAB — BASIC METABOLIC PANEL
BUN: 8 mg/dL (ref 6–23)
CO2: 29 mEq/L (ref 19–32)
Chloride: 105 mEq/L (ref 96–112)
Creatinine, Ser: 0.63 mg/dL (ref 0.4–1.2)
Glucose, Bld: 159 mg/dL — ABNORMAL HIGH (ref 70–99)
Potassium: 3.9 mEq/L (ref 3.5–5.1)

## 2010-04-15 LAB — COMPREHENSIVE METABOLIC PANEL
ALT: 20 U/L (ref 0–35)
AST: 18 U/L (ref 0–37)
Albumin: 3.4 g/dL — ABNORMAL LOW (ref 3.5–5.2)
Alkaline Phosphatase: 112 U/L (ref 39–117)
BUN: 9 mg/dL (ref 6–23)
Calcium: 9.1 mg/dL (ref 8.4–10.5)
Chloride: 95 mEq/L — ABNORMAL LOW (ref 96–112)
GFR calc Af Amer: >60 mL/min (ref >60)
Potassium: 4.2 mEq/L (ref 3.5–5.1)
Sodium: 129 mEq/L — ABNORMAL LOW (ref 135–145)
Total Bilirubin: 0.2 mg/dL — ABNORMAL LOW (ref 0.3–1.2)

## 2010-04-15 LAB — POCT I-STAT, CHEM 8
BUN: 9 mg/dL (ref 6–23)
Calcium, Ion: 1.12 mmol/L (ref 1.12–1.32)
Chloride: 99 mEq/L (ref 96–112)
HCT: 46 % (ref 36.0–46.0)
Potassium: 4.3 mEq/L (ref 3.5–5.1)

## 2010-04-15 LAB — URINE CULTURE

## 2010-04-15 LAB — GLUCOSE, CAPILLARY
Glucose-Capillary: 138 mg/dL — ABNORMAL HIGH (ref 70–99)
Glucose-Capillary: 288 mg/dL — ABNORMAL HIGH (ref 70–99)
Glucose-Capillary: 318 mg/dL — ABNORMAL HIGH (ref 70–99)

## 2010-04-15 LAB — DIFFERENTIAL
Basophils Absolute: 0 10*3/uL (ref 0.0–0.1)
Eosinophils Absolute: 0.1 10*3/uL (ref 0.0–0.7)
Lymphocytes Relative: 19 % (ref 12–46)
Lymphs Abs: 1.9 10*3/uL (ref 0.7–4.0)
Monocytes Absolute: 0.6 10*3/uL (ref 0.1–1.0)

## 2010-04-15 LAB — TYPE AND SCREEN

## 2010-04-15 LAB — URINALYSIS, ROUTINE W REFLEX MICROSCOPIC
Glucose, UA: >1000 mg/dL — AB
Ketones, ur: NEGATIVE mg/dL
Leukocytes, UA: NEGATIVE
Protein, ur: NEGATIVE mg/dL
Urobilinogen, UA: 0.2 mg/dL (ref 0.0–1.0)
pH: 5.5 (ref 5.0–8.0)

## 2010-04-15 LAB — KETONES, QUALITATIVE: Acetone, Bld: NEGATIVE

## 2010-04-15 LAB — ABO/RH: ABO/RH(D): A POS

## 2010-04-15 LAB — APTT: aPTT: 22 seconds — ABNORMAL LOW (ref 24–37)

## 2010-04-15 LAB — AMMONIA: Ammonia: 31 umol/L (ref 11–35)

## 2010-04-15 LAB — RAPID URINE DRUG SCREEN, HOSP PERFORMED
Amphetamines: NOT DETECTED
Barbiturates: NOT DETECTED
Benzodiazepines: NOT DETECTED
Tetrahydrocannabinol: NOT DETECTED

## 2010-04-15 LAB — CBC: Hemoglobin: 14.7 g/dL (ref 12.0–15.0)

## 2010-06-11 NOTE — Assessment & Plan Note (Signed)
[facility]                         GASTROENTEROLOGY OFFICE NOTE   Yvonne Reynolds, Yvonne Reynolds                     MRN:          [id number]  DATE:10/07/2006                            DOB:          Jun 16, 1945    REFERRING PHYSICIAN:  Sean A. Everardo All, MD   REASON FOR CONSULTATION:  Refractory reflux symptoms.   HISTORY OF PRESENT ILLNESS:  Ms. Cespedes is a 65 year old white female  who I have evaluated in the past with chronic GERD, with an upper  endoscopy in April 2006, which showed grade C erosive esophagitis with a  hiatal hernia and mild amount of erosive antral gastritis.  She was  treated with Nexium 40 mg b.i.d. and her symptoms were under good  control.  Due to insurance coverage changes, Nexium was discontinued and  she was treated with Zegerid 40 mg twice a day which was not as  effective as Nexium but worked well.  Recently she has been taking  Prilosec OTC b.i.d. with poor control of symptoms.  She has frequent  regurgitation.  She has frequent post prandial reflux symptoms,  nocturnal symptoms, occasional vomiting, epigastric pain, gas, and  belching.  She also has ongoing problems with mild constipation and  occasionally has some loose stools.  She sometimes notes a choking  sensation during regurgitation but she does not experience solid or  liquid dysphagia.  She has no weight loss or odynophagia.  No family  history of colon cancer, colon polyps, or inflammatory bowel disease.   MEDICATIONS:  Listed on the chart, updated and reviewed.   MEDICATION ALLERGIES:  None known.   PAST MEDICAL HISTORY:  1. Diabetes mellitus.  2. Hyperlipidemia.  3. Depression.  4. Diabetic retinopathy.  5. GERD.  6. Erosive esophagitis.  7. Hypertension.  8. Sleep apnea.  9. Allergies.  10.Status post hysterectomy.   SOCIAL HISTORY:  Per the handwritten form.   REVIEW OF SYSTEMS:  Per the handwritten form.   PHYSICAL EXAMINATION:  GENERAL:  No  acute distress.  VITAL SIGNS:  Height 5 feet 2 inches, weight 154.8 pounds, blood  pressure is 148/78, pulse 88 and regular.  HEENT:  Anicteric sclerae.  Oropharynx clear.  CHEST:  Clear to auscultation bilaterally.  CARDIAC:  Regular rate and rhythm without murmurs appreciated.  ABDOMEN:  Soft, nondistended, nontender.  Normoactive bowel sounds.  No  palpable organomegaly or masses.  RECTAL:  Deferred to time of colonoscopy.  EXTREMITIES:  Without clubbing, cyanosis, or edema.  NEUROLOGIC:  Alert and oriented x3.  Grossly nonfocal.   ASSESSMENT/PLAN:  1. Refractory gastroesophageal reflux disease with a history of grade      C erosive esophagitis and a hiatal hernia.  Discontinue Prilosec      OTC and being Prevacid 30 mg BID taken 30 minutes before breakfast      and dinner.  Re-intensify all antireflux measures and begin the use      of 4-inch bed blocks.  Written literature on all standard      antireflux measures was given.  Plan for return office visit in 4-6      weeks.  If her symptoms have not substantially improved, we will      consider proceeding with a gastric emptying scan to rule out      gastroparesis, an abd U/S and if her symptoms remain refractory      consider repeat upper endoscopy.  2. Average risk for colorectal cancer.  Colorectal cancer screening.      Risks, benefits, and alternatives to colonoscopy with possible      biopsy and possible polypectomy discussed with the patient.  She      consents to proceed.  This is scheduled electively.     Venita Lick. Russella Dar, MD, [facility]  Electronically Signed    MTS/MedQ  DD: 10/08/2006  DT: 10/08/2006  Job #: [id number]   cc:   Gregary Signs A. Everardo All, MD

## 2010-06-11 NOTE — Assessment & Plan Note (Signed)
[facility]                         GASTROENTEROLOGY OFFICE NOTE   Yvonne Reynolds, Yvonne Reynolds                     MRN:          [id number]  DATE:01/04/2007                            DOB:          [birth year]    HISTORY OF PRESENT ILLNESS:  Ms. Mathes returns after removal of a  duodenal polyp.  The initial biopsies on December 01, 2006, showed a  tubulovillous adenoma.  The complete snare polypectomy specimen from  December 22, 2006, showed duodenal mucosa with denuded surface  epithelium and underlying chronic inflammation.  The patient states her  reflux symptoms are under good control on Zegerid 40 mg p.o. b.i.d.   CURRENT MEDICATIONS:  Listed on the chart and have been reviewed.   ALLERGIES:  Unspecified allergy to Novocain, but she tolerated Cetacaine  at her recent endoscopies.   PHYSICAL EXAMINATION:  VITAL SIGNS:  No acute distress, weight 153  pounds, blood pressure 122/60, pulse 92 and regular.  CHEST:  Clear to auscultation bilaterally.  CARDIAC:  Regular rate and rhythm without murmurs.  ABDOMEN:  Soft and nontender with normoactive bowel sounds.   ASSESSMENT/PLAN:  1. Gastroesophageal reflux disease complicated by erosive esophagitis      and Barrett's esophagus.  Maintain all standard antireflux measures      and a proton pump inhibitor on a twice a day basis long term.      Continue Zegerid 40 mg p.o. b.i.d. Barret's esophagus was discussed      with the patient, and she understands that it is a precancerous      lesion.  We will plan for a follow up endoscopy in November 2011.  2. Duodenal tubulovillous adenoma.  There is a discrepancy in the      pathologic reports.  However, the polyp was completely excised with      snare cautery on her most recent endoscopy.  We will be performing      a follow up evaluation for this and Barrett's esophagus in November      2011.  3. Colorectal cancer screening.  Risks, benefits and alternatives  to      colonoscopy with possibly biopsy and possibly polypectomy discussed      with the patient.  She consents to proceed.  This will be scheduled      electively.     Venita Lick. Russella Dar, MD, [facility]  Electronically Signed    MTS/MedQ  DD: 01/04/2007  DT: 01/05/2007  Job #: [id number]   cc:   Gregary Signs A. Everardo All, MD

## 2010-06-11 NOTE — Assessment & Plan Note (Signed)
[facility]                         GASTROENTEROLOGY OFFICE NOTE   Yvonne Reynolds, Yvonne Reynolds                     MRN:          [id number]  DATE:11/19/2006                            DOB:          10-18-1945    This is a return office visit for refractory reflux symptoms with a  history of Grade C erosive esophagitis.  She feels the Protonix has not  been helping.  She still has intermittent vomiting and has noted one or  two episodes of small volume hematemesis.  She notes no dysphagia or  odynophagia.  Please see my prior note dated October 07, 2006.   CURRENT MEDICATIONS:  Listed on the chart updated and reviewed.   MEDICATION ALLERGIES:  None known.   PHYSICAL EXAMINATION:  GENERAL:  In no acute distress.  VITAL SIGNS:  Weight 152.6 pounds, blood pressure is 120/62, pulse 60  and regular.  CHEST:  Clear to auscultation bilaterally.  CARDIAC:  Regular rate and rhythm without murmurs.  ABDOMEN:  Soft and nontender with normoactive bowel sounds, no palpable  organomegaly, masses, or hernias.   ASSESSMENT AND PLAN:  Refractory gastroesophageal reflux disease with a  history of Grade C erosive esophagitis.  Discontinue Protonix and begin  Zegerid 40 mg p.o. b.i.d.  Begin Metoclopramide 5 mg p.o. a.c. and  q.h.s.  Begin the use of four-inch bed blocks and reintensify antireflux  measures.  Risks, benefits, and alternatives to upper endoscopy with  possible biopsy discussed with the patient and she consents to proceed,  and this will be scheduled electively.     Venita Lick. Russella Dar, MD, [facility]  Electronically Signed    MTS/MedQ  DD: 11/25/2006  DT: 11/25/2006  Job #: [id number]   cc:   Gregary Signs A. Everardo All, MD

## 2010-06-14 ENCOUNTER — Ambulatory Visit: Payer: Self-pay | Admitting: Endocrinology

## 2010-06-18 ENCOUNTER — Ambulatory Visit (INDEPENDENT_AMBULATORY_CARE_PROVIDER_SITE_OTHER): Payer: Self-pay | Admitting: Endocrinology

## 2010-06-18 ENCOUNTER — Other Ambulatory Visit: Payer: Self-pay

## 2010-06-18 ENCOUNTER — Encounter: Payer: Self-pay | Admitting: Endocrinology

## 2010-06-18 VITALS — BP 122/62 | HR 91 | Temp 98.4°F | Ht 61.0 in | Wt 141.0 lb

## 2010-06-18 DIAGNOSIS — E119 Type 2 diabetes mellitus without complications: Secondary | ICD-10-CM

## 2010-06-18 MED ORDER — INSULIN NPH (HUMAN) (ISOPHANE) 100 UNIT/ML ~~LOC~~ SUSP: SUBCUTANEOUS | Status: DC

## 2010-06-18 MED ORDER — ACETAMINOPHEN-CODEINE #2 300-15 MG PO TABS: 1.0000 | ORAL_TABLET | ORAL | Status: AC | PRN

## 2010-06-18 NOTE — Telephone Encounter (Signed)
A user error has taken place: encounter opened in error, closed for administrative reasons.

## 2010-06-18 NOTE — Progress Notes (Signed)
Subjective:    Patient ID: Yvonne Reynolds, female    DOB: 1945-06-21, 65 y.o.   MRN: [id number]  HPI Pt states few mos of moderate pain at the right upper arm and shoulder, only in the context of moving it.  No assoc numbness. She does not take lantus due to the cost. Pt says a son wants to have her declared incompetent.  Pt says that while she has depression, she is able to make decisions for herself. Past Medical History  Diagnosis Date  . 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 DIABETES MELLITUS, TYPE II 12/21/2006  . DIABETIC  RETINOPATHY 12/22/2006  . HYPERLIPIDEMIA 12/22/2006  . DEPRESSION 06/26/2006  . OBSTRUCTIVE SLEEP APNEA 12/22/2006    no CPAP  . HYPERTENSION 06/26/2006  . ALLERGIC RHINITIS 12/22/2006  . CHRONIC OBSTRUCTIVE PULMONARY DISEASE, ACUTE EXACERBATION 08/24/2007  . Reflux esophagitis 12/22/2006  . GERD 06/26/2006    with EE  . BARRETTS ESOPHAGUS 12/22/2006    without dysplasia  . HIATAL HERNIA 12/22/2006  . OSTEOARTHRITIS 06/26/2006  . OSTEOPOROSIS 06/26/2006    Past Surgical History  Procedure Date  . Abdominal hysterectomy   . Esophagogastroduodenoscopy 05/07/2004  . Cesarean section   . Wrist surgery     s/p left  after fall  . Incontinence surgery     s/p    History   Social History  . Marital Status: Widowed    Spouse Name: N/A    Number of Children: N/A  . Years of Education: N/A   Occupational History  . 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 Smoking status: Current Everyday Smoker -- 1.0 packs/day  . Smokeless tobacco: Not on file  . Alcohol Use: No  . Drug Use: No  . Sexually Active: Not on file   Other Topics Concern  . Not on file   Social History Narrative   WidowedDaily Caffeine Use-2 cups daily or morePt does not get regular exerciseWorks Indust mfg, 3rd shift    Current Outpatient Prescriptions on File  Prior to Visit  Medication Sig Dispense Refill  . glucose blood (ONE TOUCH ULTRA TEST) test strip Two times a day dx 250.01       . insulin glargine (LANTUS SOLOSTAR) 100 UNIT/ML injection Inject 70 Units into the skin 2 (two) times daily.        Marland Kitchen losartan (COZAAR) 50 MG tablet Take 50 mg by mouth daily.        . metFORMIN (GLUCOPHAGE) 1000 MG tablet Take 2 tablets by mouth at bedtime for diabetes       . omeprazole-sodium bicarbonate (ZEGERID) 40-1100 MG per capsule Take 1 capsule by mouth 2 (two) times daily. For heartburn       . oxybutynin (DITROPAN) 5 MG tablet Take 5 mg by mouth 2 (two) times daily.        . rosuvastatin (CRESTOR) 40 MG tablet Take 40 mg by mouth daily.        Marland Kitchen DISCONTD: venlafaxine (EFFEXOR-XR) 150 MG 24 hr capsule 2 by mouth once daily         Allergies  Allergen Reactions  . Procaine Hcl     REACTION: hallucinations    Family History  Problem Relation Age of Onset  . Cancer Mother     Ovarian Cancer  . Stroke Father   . Diabetes Sister   . Stroke Sister   .  Diabetes Brother   . Diabetes Sister   . Hypertension Other   . Hyperlipidemia Other     BP 122/62  Pulse 91  Temp(Src) 98.4 F (36.9 C) (Oral)  Ht 5\' 1"  (1.549 m)  Wt 141 lb (63.957 kg)  BMI 26.64 kg/m2  SpO2 97%    Review of Systems denies hypoglycemia.  She denies rash    Objective:   Physical Exam GENERAL: no distress Right shoulder:  Full rom, but rom is painful.  Nontender. Pulse: right radial is intact. Neuro: sensation is intact to touch on the right upper extremity.   PSYCH: Alert and oriented x 3.  Tearful.        Assessment & Plan:  Right shoulder pain, uncertain etiology Dm: therapy limited by pt's request for least expensive meds While pt has depression, she is competent in my opinion.

## 2010-06-18 NOTE — Patient Instructions (Addendum)
Here is a prescription for your shoulder pain. i have changed your insulin to a cheaper type. check your blood sugar 1 time a day.  vary the time of day when you check, between before the 3 meals, and at bedtime.  also check if you have symptoms of your blood sugar being too high or too low.  please keep a record of the readings and bring it to your next appointment here.  please call us sooner if you are having low blood sugar episodes. You should get a "[facility]" brand blood sugar meter, for which the strips are much cheaper. Please make a follow-up appointment in 1 month

## 2010-07-01 ENCOUNTER — Other Ambulatory Visit: Payer: Self-pay | Admitting: *Deleted

## 2010-07-01 NOTE — Telephone Encounter (Signed)
Pharmacist at [facility] notified.

## 2010-07-01 NOTE — Telephone Encounter (Signed)
ok 

## 2010-07-01 NOTE — Telephone Encounter (Signed)
[facility] sent fax stating that only strength they have of Tylenol with Codeine is 300-30mg . Per fax, is it okay to change rx to that strength?

## 2010-10-24 ENCOUNTER — Telehealth: Payer: Self-pay | Admitting: *Deleted

## 2010-10-24 NOTE — Telephone Encounter (Signed)
[facility] pharmacy sent fax asking to change pt from Humulin N to Novolin N since they no longer manufacture Humulin N. Okay to change to Humulin N? Please advise in SAE's absence.

## 2010-10-24 NOTE — Telephone Encounter (Signed)
Ok for change.

## 2010-10-25 MED ORDER — CITALOPRAM HYDROBROMIDE 20 MG PO TABS: 20.0000 mg | ORAL_TABLET | Freq: Two times a day (BID) | ORAL | Status: DC

## 2010-10-25 MED ORDER — METFORMIN HCL 1000 MG PO TABS: 2000.0000 mg | ORAL_TABLET | Freq: Every day | ORAL | Status: DC

## 2010-10-25 MED ORDER — INSULIN NPH (HUMAN) (ISOPHANE) 100 UNIT/ML ~~LOC~~ SUSP: SUBCUTANEOUS | Status: DC

## 2010-10-25 NOTE — Telephone Encounter (Signed)
Left message on machine for pt to return my call  

## 2010-10-25 NOTE — Telephone Encounter (Signed)
Pt advised.

## 2011-01-29 ENCOUNTER — Other Ambulatory Visit: Payer: Self-pay

## 2011-01-29 NOTE — Telephone Encounter (Signed)
Pt called requesting to be switched back to Labetalol. She says that it works better for her that Losartan. Pt aware MD out of office until 01/31/2011 and has enough medication until that time.

## 2011-01-31 MED ORDER — ROSUVASTATIN CALCIUM 40 MG PO TABS: 40.0000 mg | ORAL_TABLET | Freq: Every day | ORAL | Status: DC

## 2011-01-31 NOTE — Telephone Encounter (Signed)
This depends on other factors such as your heart rate.  As ov is overdue, please schedule, and we'll address then.

## 2011-01-31 NOTE — Telephone Encounter (Signed)
Pt informed of MD's advisement via VM to schedule OV to discuss changing medication and to callback office with any questions/concerns.

## 2011-02-05 ENCOUNTER — Ambulatory Visit (INDEPENDENT_AMBULATORY_CARE_PROVIDER_SITE_OTHER): Payer: Self-pay | Admitting: Endocrinology

## 2011-02-05 ENCOUNTER — Encounter: Payer: Self-pay | Admitting: Endocrinology

## 2011-02-05 VITALS — BP 134/70 | HR 89 | Temp 98.4°F | Ht 61.0 in | Wt 142.2 lb

## 2011-02-05 DIAGNOSIS — R809 Proteinuria, unspecified: Secondary | ICD-10-CM

## 2011-02-05 DIAGNOSIS — I10 Essential (primary) hypertension: Secondary | ICD-10-CM

## 2011-02-05 DIAGNOSIS — F329 Major depressive disorder, single episode, unspecified: Secondary | ICD-10-CM

## 2011-02-05 DIAGNOSIS — Z23 Encounter for immunization: Secondary | ICD-10-CM

## 2011-02-05 DIAGNOSIS — E785 Hyperlipidemia, unspecified: Secondary | ICD-10-CM

## 2011-02-05 DIAGNOSIS — E119 Type 2 diabetes mellitus without complications: Secondary | ICD-10-CM

## 2011-02-05 MED ORDER — LISINOPRIL 10 MG PO TABS: 10.0000 mg | ORAL_TABLET | Freq: Every day | ORAL | Status: DC

## 2011-02-05 NOTE — Progress Notes (Signed)
Subjective:    Patient ID: Yvonne Reynolds, female    DOB: [birth year], 66 y.o.   MRN: [id number]  HPI The state of at least three ongoing medical problems is addressed today: Pt returns for f/u of insulin-requiring DM (1995).  no cbg record, but states cbg's are well-controlled.  She seldom has hypoglycemia, and these episodes are mild.   HTN: She ran out of all 3 bp meds, a few weeks ago.   Dyslipidemia: she takes no med for this.   Past Medical History  Diagnosis Date  . 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 DIABETES MELLITUS, TYPE II 12/21/2006  . DIABETIC  RETINOPATHY 12/22/2006  . HYPERLIPIDEMIA 12/22/2006  . DEPRESSION 06/26/2006  . OBSTRUCTIVE SLEEP APNEA 12/22/2006    no CPAP  . HYPERTENSION 06/26/2006  . ALLERGIC RHINITIS 12/22/2006  . CHRONIC OBSTRUCTIVE PULMONARY DISEASE, ACUTE EXACERBATION 08/24/2007  . Reflux esophagitis 12/22/2006  . GERD 06/26/2006    with EE  . BARRETTS ESOPHAGUS 12/22/2006    without dysplasia  . HIATAL HERNIA 12/22/2006  . OSTEOARTHRITIS 06/26/2006  . OSTEOPOROSIS 06/26/2006    Past Surgical History  Procedure Date  . Abdominal hysterectomy   . Esophagogastroduodenoscopy 05/07/2004  . Cesarean section   . Wrist surgery     s/p left  after fall  . Incontinence surgery     s/p    History   Social History  . Marital Status: Widowed    Spouse Name: N/A    Number of Children: N/A  . Years of Education: N/A   Occupational History  . 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 Smoking status: Current Everyday Smoker -- 1.0 packs/day  . Smokeless tobacco: Not on file  . Alcohol Use: No  . Drug Use: No  . Sexually Active: Not on file   Other Topics Concern  . Not on file   Social History Narrative   WidowedDaily Caffeine Use-2 cups daily or morePt does not get regular exerciseWorks Indust [employer], 3rd shift    Current  Outpatient Prescriptions on File Prior to Visit  Medication Sig Dispense Refill  . citalopram (CELEXA) 20 MG tablet Take 1 tablet (20 mg total) by mouth 2 (two) times daily.  180 tablet  1  . glucose blood (ONE TOUCH ULTRA TEST) test strip Two times a day dx 250.01       . omeprazole-sodium bicarbonate (ZEGERID) 40-1100 MG per capsule Take 1 capsule by mouth 2 (two) times daily. For heartburn       . oxybutynin (DITROPAN) 5 MG tablet Take 5 mg by mouth 2 (two) times daily.        . traMADol (ULTRAM) 50 MG tablet Take 50 mg by mouth every 6 (six) hours as needed. For pain         Allergies  Allergen Reactions  . Procaine Hcl     REACTION: hallucinations    Family History  Problem Relation Age of Onset  . Cancer Mother     Ovarian Cancer  . Stroke Father   . Diabetes Sister   . Stroke Sister   . Diabetes Brother   . Diabetes Sister   . Hypertension Other   . Hyperlipidemia Other     BP 134/70  Pulse 89  Temp(Src) 98.4 F (36.9 C) (Oral)  Ht 5\' 1"  (1.549 m)  Wt 142 lb 3.2 oz (64.501 kg)  BMI 26.87 kg/m2  SpO2 96%  '  Review of Systems Denies weight change/chest pain/sob/loc.  Depression is well-controlled    Objective:   Physical Exam VITAL SIGNS:  See vs page GENERAL: no distress Pulses: dorsalis pedis intact bilat.   Feet: no deformity.  no ulcer on the feet.  feet are of normal color and temp.  no edema Neuro: sensation is intact to touch on the feet      Assessment & Plan:  DM, uncertain control HTN, therapy limited by noncompliance.  i'll do the best i can. Dyslipidemia, uncertain control

## 2011-02-05 NOTE — Patient Instructions (Addendum)
blood tests are being requested for you today.  please call [phone] to hear your test results.  You will be prompted to enter the 9-digit "MRN" number that appears at the top left of this page, followed by #.  Then you will hear the message. Stay-off losartan and hctz. continue lisinopril, 10 mg daily.  i have sent a prescription to your pharmacy, for a refill.  I realize you do not have health insurance, but there are many other services you need for your health.  These include screening tests for colon, prostate and/or breast cancer; electrocardiogram, and others.  Please let me know if you want to get these important tests done.   Please come back for a follow-up appointment in 3 months.

## 2011-02-06 ENCOUNTER — Other Ambulatory Visit: Payer: Self-pay

## 2011-02-06 NOTE — Telephone Encounter (Signed)
A user error has taken place: encounter opened in error, closed for administrative reasons.

## 2011-04-14 ENCOUNTER — Emergency Department (HOSPITAL_COMMUNITY): Payer: No Typology Code available for payment source

## 2011-04-14 ENCOUNTER — Encounter (HOSPITAL_COMMUNITY): Payer: Self-pay | Admitting: *Deleted

## 2011-04-14 ENCOUNTER — Emergency Department (HOSPITAL_COMMUNITY)
Admission: EM | Admit: 2011-04-14 | Discharge: 2011-04-14 | Disposition: A | Discharge status: Home / Self Care | Payer: No Typology Code available for payment source | Attending: Emergency Medicine | Admitting: Emergency Medicine

## 2011-04-14 DIAGNOSIS — I10 Essential (primary) hypertension: Secondary | ICD-10-CM | POA: Insufficient documentation

## 2011-04-14 DIAGNOSIS — R079 Chest pain, unspecified: Secondary | ICD-10-CM | POA: Insufficient documentation

## 2011-04-14 DIAGNOSIS — E119 Type 2 diabetes mellitus without complications: Secondary | ICD-10-CM | POA: Insufficient documentation

## 2011-04-14 DIAGNOSIS — Y92410 Unspecified street and highway as the place of occurrence of the external cause: Secondary | ICD-10-CM | POA: Insufficient documentation

## 2011-04-14 DIAGNOSIS — M25569 Pain in unspecified knee: Secondary | ICD-10-CM | POA: Insufficient documentation

## 2011-04-14 DIAGNOSIS — IMO0002 Reserved for concepts with insufficient information to code with codable children: Secondary | ICD-10-CM | POA: Insufficient documentation

## 2011-04-14 DIAGNOSIS — T1490XA Injury, unspecified, initial encounter: Secondary | ICD-10-CM | POA: Insufficient documentation

## 2011-04-14 DIAGNOSIS — Z794 Long term (current) use of insulin: Secondary | ICD-10-CM | POA: Insufficient documentation

## 2011-04-14 MED ORDER — CYCLOBENZAPRINE HCL 10 MG PO TABS: 10.0000 mg | ORAL_TABLET | Freq: Two times a day (BID) | ORAL | Status: AC | PRN

## 2011-04-14 MED ORDER — ACETAMINOPHEN 500 MG PO TABS: 500.0000 mg | ORAL_TABLET | Freq: Four times a day (QID) | ORAL | Status: AC | PRN

## 2011-04-14 MED ORDER — ACETAMINOPHEN 325 MG PO TABS
650.0000 mg | ORAL_TABLET | Freq: Once | ORAL | Status: AC
  Administered 2011-04-14: 650 mg via ORAL
  Filled 2011-04-14: qty 2
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
## 2011-04-16 NOTE — ED Provider Notes (Signed)
Medical screening examination/treatment/procedure(s) were performed by non-physician practitioner and as supervising physician I was immediately available for consultation/collaboration.  Toy Baker, MD 04/16/11 0930

## 2011-04-18 ENCOUNTER — Telehealth: Payer: Self-pay
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
## 2011-04-18 NOTE — Telephone Encounter (Signed)
Nothing broken--good.  I hope you feel better soon.  If you don't feel better by next week, please call back

## 2011-04-18 NOTE — Telephone Encounter (Signed)
Left message for pt to callback office.  

## 2011-04-21 NOTE — Telephone Encounter (Signed)
Left message informing pt of MD's advisement and to callback office with any questions/concerns.  

## 2011-04-23 ENCOUNTER — Ambulatory Visit: Payer: Self-pay | Admitting: Endocrinology

## 2011-04-23 DIAGNOSIS — Z0289 Encounter for other administrative examinations: Secondary | ICD-10-CM

## 2011-05-07 ENCOUNTER — Ambulatory Visit: Payer: Self-pay | Admitting: Endocrinology

## 2011-06-03 ENCOUNTER — Other Ambulatory Visit (INDEPENDENT_AMBULATORY_CARE_PROVIDER_SITE_OTHER): Payer: No Typology Code available for payment source

## 2011-06-03 ENCOUNTER — Encounter: Payer: Self-pay | Admitting: Endocrinology

## 2011-06-03 ENCOUNTER — Ambulatory Visit (INDEPENDENT_AMBULATORY_CARE_PROVIDER_SITE_OTHER): Payer: No Typology Code available for payment source | Admitting: Endocrinology

## 2011-06-03 VITALS — BP 112/62 | HR 96 | Temp 98.5°F | Ht 62.0 in | Wt 139.0 lb

## 2011-06-03 DIAGNOSIS — R809 Proteinuria, unspecified: Secondary | ICD-10-CM

## 2011-06-03 DIAGNOSIS — R109 Unspecified abdominal pain: Secondary | ICD-10-CM | POA: Insufficient documentation

## 2011-06-03 LAB — URINALYSIS, ROUTINE W REFLEX MICROSCOPIC
Bilirubin Urine: NEGATIVE
Ketones, ur: NEGATIVE
Leukocytes, UA: NEGATIVE
Nitrite: NEGATIVE
Urobilinogen, UA: 0.2 (ref 0.0–1.0)
pH: 5.5 (ref 5.0–8.0)

## 2011-06-03 LAB — AMYLASE: Amylase: 52 U/L (ref 27–131)

## 2011-06-03 LAB — HEPATIC FUNCTION PANEL
AST: 18 U/L (ref 0–37)
Alkaline Phosphatase: 98 U/L (ref 39–117)
Bilirubin, Direct: 0.1 mg/dL (ref 0.0–0.3)
Total Bilirubin: 0.4 mg/dL (ref 0.3–1.2)

## 2011-06-03 NOTE — Progress Notes (Signed)
Subjective:    Patient ID: Yvonne Reynolds, female    DOB: April 01, 1945, 66 y.o.   MRN: [id number]  HPI Pt returns for f/u of insulin-requiring DM (dx'ed 1995, complicated by retinopathy and nephropathy).  no cbg record, but states cbg's are well-controlled.  She seldom has hypoglycemia, and these episodes are mild.   Pt state a few days of intermittent moderate pain at the RLQ and right lower back.  She has assoc n/v.  sxs have resolved today.   She refuses [place] due to no insurance. Past Medical History  Diagnosis Date  . 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 DIABETES MELLITUS, TYPE II 12/21/2006  . DIABETIC  RETINOPATHY 12/22/2006  . HYPERLIPIDEMIA 12/22/2006  . DEPRESSION 06/26/2006  . OBSTRUCTIVE SLEEP APNEA 12/22/2006    no CPAP  . HYPERTENSION 06/26/2006  . ALLERGIC RHINITIS 12/22/2006  . CHRONIC OBSTRUCTIVE PULMONARY DISEASE, ACUTE EXACERBATION 08/24/2007  . Reflux esophagitis 12/22/2006  . GERD 06/26/2006    with EE  . BARRETTS ESOPHAGUS 12/22/2006    without dysplasia  . HIATAL HERNIA 12/22/2006  . OSTEOARTHRITIS 06/26/2006  . OSTEOPOROSIS 06/26/2006    Past Surgical History  Procedure Date  . Abdominal hysterectomy   . Esophagogastroduodenoscopy 05/07/2004  . Cesarean section   . Wrist surgery     s/p left  after fall  . Incontinence surgery     s/p    History   Social History  . Marital Status: Widowed    Spouse Name: N/A    Number of Children: N/A  . Years of Education: N/A   Occupational History  . 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 Smoking status: Current Everyday Smoker -- 1.0 packs/day  . Smokeless tobacco: Not on file  . Alcohol Use: No  . Drug Use: No  . Sexually Active: Not on file   Other Topics Concern  . Not on file   Social History Narrative   WidowedDaily Caffeine Use-2 cups daily or morePt does not get regular  exercise[employer], 3rd shift    Current Outpatient Prescriptions on File Prior to Visit  Medication Sig Dispense Refill  . calcium carbonate (OS-CAL) 600 MG TABS Take 600 mg by mouth 2 (two) times daily with a meal.      . citalopram (CELEXA) 20 MG tablet Take 1 tablet (20 mg total) by mouth 2 (two) times daily.  180 tablet  1  . glucose blood (ONE TOUCH ULTRA TEST) test strip Two times a day dx 250.01       . insulin NPH (HUMULIN N,NOVOLIN N) 100 UNIT/ML injection Inject 60 Units into the skin every morning.       Marland Kitchen lisinopril (PRINIVIL,ZESTRIL) 10 MG tablet Take 1 tablet (10 mg total) by mouth daily.  30 tablet  11  . omeprazole-sodium bicarbonate (ZEGERID) 40-1100 MG per capsule Take 1 capsule by mouth 2 (two) times daily. For heartburn       . vitamin E (VITAMIN E) 400 UNIT capsule Take 400 Units by mouth daily.      Marland Kitchen DISCONTD: oxybutynin (DITROPAN) 5 MG tablet Take 5 mg by mouth 2 (two) times daily.        Marland Kitchen DISCONTD: simvastatin (ZOCOR) 40 MG tablet Take 40 mg by mouth every evening.        Allergies  Allergen Reactions  . Procaine Hcl  REACTION: hallucinations    Family History  Problem Relation Age of Onset  . Cancer Mother     Ovarian Cancer  . Stroke Father   . Diabetes Sister   . Stroke Sister   . Diabetes Brother   . Diabetes Sister   . Hypertension Other   . Hyperlipidemia Other     BP 112/62  Pulse 96  Temp(Src) 98.5 F (36.9 C) (Oral)  Ht 5\' 2"  (1.575 m)  Wt 139 lb (63.05 kg)  BMI 25.42 kg/m2  SpO2 94%  Review of Systems Denies fever and diarrhea.    Objective:   Physical Exam VITAL SIGNS:  See vs page GENERAL: no distress ABDOMEN: abdomen is soft, nontender.  no hepatosplenomegaly.  not distended.  no hernia     Assessment & Plan:  DM, apparently well-controlled Flank and abd pain, new, uncertain etiology

## 2011-06-03 NOTE — Patient Instructions (Addendum)
blood tests are being requested for you today.  You will receive a letter with results. I hope you feel better soon.  If you don't feel better by next week, please call back. Please come back for a follow-up appointment in 4 months.

## 2011-06-06 ENCOUNTER — Ambulatory Visit (INDEPENDENT_AMBULATORY_CARE_PROVIDER_SITE_OTHER): Payer: Self-pay | Admitting: Endocrinology

## 2011-06-06 ENCOUNTER — Other Ambulatory Visit (INDEPENDENT_AMBULATORY_CARE_PROVIDER_SITE_OTHER): Payer: No Typology Code available for payment source

## 2011-06-06 ENCOUNTER — Encounter: Payer: Self-pay | Admitting: Endocrinology

## 2011-06-06 VITALS — BP 122/74 | HR 100 | Temp 98.4°F | Ht 61.0 in | Wt 142.0 lb

## 2011-06-06 DIAGNOSIS — E119 Type 2 diabetes mellitus without complications: Secondary | ICD-10-CM

## 2011-06-06 DIAGNOSIS — B029 Zoster without complications: Secondary | ICD-10-CM

## 2011-06-06 DIAGNOSIS — R809 Proteinuria, unspecified: Secondary | ICD-10-CM

## 2011-06-06 DIAGNOSIS — E785 Hyperlipidemia, unspecified: Secondary | ICD-10-CM

## 2011-06-06 DIAGNOSIS — F329 Major depressive disorder, single episode, unspecified: Secondary | ICD-10-CM

## 2011-06-06 DIAGNOSIS — I10 Essential (primary) hypertension: Secondary | ICD-10-CM

## 2011-06-06 LAB — BASIC METABOLIC PANEL
BUN: 19 mg/dL (ref 6–23)
CO2: 30 mEq/L (ref 19–32)
Chloride: 99 mEq/L (ref 96–112)
GFR: 82.66 mL/min (ref >60.00)
Glucose, Bld: 123 mg/dL — ABNORMAL HIGH (ref 70–99)
Potassium: 4.5 mEq/L (ref 3.5–5.1)
Sodium: 138 mEq/L (ref 135–145)

## 2011-06-06 LAB — LIPID PANEL
HDL: 50.5 mg/dL (ref >39.00)
LDL Cholesterol: 52 mg/dL (ref 0–99)
VLDL: 19.8 mg/dL (ref 0.0–40.0)

## 2011-06-06 MED ORDER — ACYCLOVIR 200 MG PO CAPS: ORAL_CAPSULE | ORAL | Status: DC

## 2011-06-06 NOTE — Patient Instructions (Signed)
i have sent a prescription to your pharmacy, for a pill that treats the virus causing shingles. I hope you feel better soon.  If you don't feel better by next week, please call back.   Shingles Shingles is caused by the same virus that causes chickenpox. The first feelings may be pain or tingling. A rash will follow in a couple days. The rash may occur on any area of the body. Long-lasting pain is more likely in an elderly person. It can last months to years. There are medicines that can help prevent pain if you start taking them early. HOME CARE    Place cool cloths on the rash.   Only take medicine as told by your doctor.   You may use calamine lotion to relieve itchy skin.   Avoid touching:   Babies.   Children with inflamed skin (eczema).   People who have gotten transplanted organs.   People with chronic illnesses, such as leukemia and AIDS.   If the rash is on the face, you may need to see a specialist. Keep all appointments. Shingles must be kept away from the eyes, if possible.   Keep all appointments.   Avoid touching the eyes or eye area, if possible.  GET HELP RIGHT AWAY IF:    You have any pain on the face or eye.   Your medicines do not help.   Your redness or puffiness (swelling) spreads.   You have a fever.   You notice any red lines going away from the rash area.  MAKE SURE YOU:    Understand these instructions.   Will watch your condition.   Will get help right away if you are not doing well or get worse.  Document Released: 07/02/2007 Document Revised: 01/02/2011 Document Reviewed: 07/02/2007 [facility] Patient Information 2012 [place], [place].

## 2011-06-06 NOTE — Progress Notes (Signed)
Subjective:    Patient ID: Yvonne Reynolds, female    DOB: 1945-11-11, 66 y.o.   MRN: [id number]  HPI Pt states 5 days of pain at the right flank.  She now has 8 hrs of assoc rash.   Past Medical History  Diagnosis Date  . 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 DIABETES MELLITUS, TYPE II 12/21/2006  . DIABETIC  RETINOPATHY 12/22/2006  . HYPERLIPIDEMIA 12/22/2006  . DEPRESSION 06/26/2006  . OBSTRUCTIVE SLEEP APNEA 12/22/2006    no CPAP  . HYPERTENSION 06/26/2006  . ALLERGIC RHINITIS 12/22/2006  . CHRONIC OBSTRUCTIVE PULMONARY DISEASE, ACUTE EXACERBATION 08/24/2007  . Reflux esophagitis 12/22/2006  . GERD 06/26/2006    with EE  . BARRETTS ESOPHAGUS 12/22/2006    without dysplasia  . HIATAL HERNIA 12/22/2006  . OSTEOARTHRITIS 06/26/2006  . OSTEOPOROSIS 06/26/2006    Past Surgical History  Procedure Date  . Abdominal hysterectomy   . Esophagogastroduodenoscopy 05/07/2004  . Cesarean section   . Wrist surgery     s/p left  after fall  . Incontinence surgery     s/p    History   Social History  . Marital Status: Widowed    Spouse Name: N/A    Number of Children: N/A  . Years of Education: N/A   Occupational History  . 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 Smoking status: Current Everyday Smoker -- 1.0 packs/day  . Smokeless tobacco: Not on file  . Alcohol Use: No  . Drug Use: No  . Sexually Active: Not on file   Other Topics Concern  . Not on file   Social History Narrative   WidowedDaily Caffeine Use-2 cups daily or morePt does not get regular exercise[employer], 3rd shift    Current Outpatient Prescriptions on File Prior to Visit  Medication Sig Dispense Refill  . calcium carbonate (OS-CAL) 600 MG TABS Take 600 mg by mouth 2 (two) times daily with a meal.      . citalopram (CELEXA) 20 MG tablet Take 1 tablet (20 mg total) by mouth 2  (two) times daily.  180 tablet  1  . glucose blood (ONE TOUCH ULTRA TEST) test strip Two times a day dx 250.01       . insulin NPH (HUMULIN N,NOVOLIN N) 100 UNIT/ML injection Inject 70 Units into the skin every morning.       Marland Kitchen lisinopril (PRINIVIL,ZESTRIL) 10 MG tablet Take 1 tablet (10 mg total) by mouth daily.  30 tablet  11  . omeprazole-sodium bicarbonate (ZEGERID) 40-1100 MG per capsule Take 1 capsule by mouth 2 (two) times daily. For heartburn       . vitamin E (VITAMIN E) 400 UNIT capsule Take 400 Units by mouth daily.      Marland Kitchen DISCONTD: oxybutynin (DITROPAN) 5 MG tablet Take 5 mg by mouth 2 (two) times daily.        Marland Kitchen DISCONTD: simvastatin (ZOCOR) 40 MG tablet Take 40 mg by mouth every evening.        Allergies  Allergen Reactions  . Procaine Hcl     REACTION: hallucinations    Family History  Problem Relation Age of Onset  . Cancer Mother     Ovarian Cancer  . Stroke Father   . Diabetes Sister   . Stroke Sister   . Diabetes Brother   . Diabetes Sister   .  Hypertension Other   . Hyperlipidemia Other    BP 122/74  Pulse 100  Temp(Src) 98.4 F (36.9 C) (Oral)  Ht 5\' 1"  (1.549 m)  Wt 142 lb (64.411 kg)  BMI 26.83 kg/m2  SpO2 95%  Review of Systems Denies fever.     Objective:   Physical Exam VITAL SIGNS:  See vs page GENERAL: no distress Skin: right flank: band-like distribution of red raised 8 mm areas.    Lab Results  Component Value Date   HGBA1C 10.1* 06/06/2011      Assessment & Plan:  Shingles, new DM, needs increased rx

## 2011-06-07 ENCOUNTER — Encounter: Payer: Self-pay | Admitting: Endocrinology

## 2011-06-07 DIAGNOSIS — B029 Zoster without complications: Secondary | ICD-10-CM | POA: Insufficient documentation

## 2011-06-09 ENCOUNTER — Telehealth: Payer: Self-pay | Admitting: *Deleted

## 2011-06-09 NOTE — Telephone Encounter (Signed)
Called pt to inform of lab results, left message for pt to callback office (letter also mailed to pt). 

## 2011-06-10 NOTE — Telephone Encounter (Signed)
Left message for pt to callback office.  

## 2011-06-11 NOTE — Telephone Encounter (Signed)
Informed pt of lab results via VM and to callback office with any questions/concerns.

## 2011-07-21 ENCOUNTER — Other Ambulatory Visit: Payer: Self-pay | Admitting: Endocrinology

## 2011-08-21 ENCOUNTER — Other Ambulatory Visit: Payer: Self-pay | Admitting: Endocrinology

## 2011-10-06 ENCOUNTER — Ambulatory Visit: Payer: Self-pay | Admitting: Endocrinology

## 2011-10-06 DIAGNOSIS — Z0289 Encounter for other administrative examinations: Secondary | ICD-10-CM

## 2011-10-09 ENCOUNTER — Encounter: Payer: Self-pay | Admitting: Endocrinology

## 2011-10-09 ENCOUNTER — Ambulatory Visit (INDEPENDENT_AMBULATORY_CARE_PROVIDER_SITE_OTHER): Payer: Self-pay | Admitting: Endocrinology

## 2011-10-09 VITALS — BP 124/64 | HR 93 | Temp 98.2°F | Resp 16 | Wt 160.0 lb

## 2011-10-09 DIAGNOSIS — E119 Type 2 diabetes mellitus without complications: Secondary | ICD-10-CM

## 2011-10-09 MED ORDER — SIMVASTATIN 80 MG PO TABS: 40.0000 mg | ORAL_TABLET | Freq: Every day | ORAL | Status: DC

## 2011-10-09 MED ORDER — CITALOPRAM HYDROBROMIDE 20 MG PO TABS: 20.0000 mg | ORAL_TABLET | Freq: Two times a day (BID) | ORAL | Status: DC

## 2011-10-09 NOTE — Progress Notes (Signed)
Subjective:    Patient ID: Yvonne Reynolds, female    DOB: 02-05-1945, 66 y.o.   MRN: [id number]  HPI She will regain her insurance in 2 months.  She can't afford crestor.  She felt as though she may have had mild hypoglycemia this am, but she did not check.   Past Medical History  Diagnosis Date  . 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 DIABETES MELLITUS, TYPE II 12/21/2006  . DIABETIC  RETINOPATHY 12/22/2006  . HYPERLIPIDEMIA 12/22/2006  . DEPRESSION 06/26/2006  . OBSTRUCTIVE SLEEP APNEA 12/22/2006    no CPAP  . HYPERTENSION 06/26/2006  . ALLERGIC RHINITIS 12/22/2006  . CHRONIC OBSTRUCTIVE PULMONARY DISEASE, ACUTE EXACERBATION 08/24/2007  . Reflux esophagitis 12/22/2006  . GERD 06/26/2006    with EE  . BARRETTS ESOPHAGUS 12/22/2006    without dysplasia  . HIATAL HERNIA 12/22/2006  . OSTEOARTHRITIS 06/26/2006  . OSTEOPOROSIS 06/26/2006    Past Surgical History  Procedure Date  . Abdominal hysterectomy   . Esophagogastroduodenoscopy 05/07/2004  . Cesarean section   . Wrist surgery     s/p left  after fall  . Incontinence surgery     s/p    History   Social History  . Marital Status: Widowed    Spouse Name: N/A    Number of Children: N/A  . Years of Education: N/A   Occupational History  . 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 Smoking status: Current Every Day Smoker -- 1.0 packs/day  . Smokeless tobacco: Not on file  . Alcohol Use: No  . Drug Use: No  . Sexually Active: Not on file   Other Topics Concern  . Not on file   Social History Narrative   WidowedDaily Caffeine Use-2 cups daily or morePt does not get regular exercise[employer], 3rd shift    Current Outpatient Prescriptions on File Prior to Visit  Medication Sig Dispense Refill  . calcium carbonate (OS-CAL) 600 MG TABS Take 600 mg by mouth 2 (two) times daily with a meal.       . citalopram (CELEXA) 20 MG tablet Take 1 tablet (20 mg total) by mouth 2 (two) times daily.  180 tablet  1  . glucose blood (ONE TOUCH ULTRA TEST) test strip Two times a day dx 250.01       . insulin NPH (HUMULIN N,NOVOLIN N) 100 UNIT/ML injection Inject 70 Units into the skin every morning.       Marland Kitchen lisinopril (PRINIVIL,ZESTRIL) 10 MG tablet Take 1 tablet (10 mg total) by mouth daily.  30 tablet  11  . omeprazole-sodium bicarbonate (ZEGERID) 40-1100 MG per capsule Take 1 capsule by mouth 2 (two) times daily. For heartburn       . vitamin E (VITAMIN E) 400 UNIT capsule Take 400 Units by mouth daily.      Marland Kitchen RELION INSULIN SYR 0.5CC/29G 29G X 1/2" 0.5 ML MISC USE AS DIRECTED  100 each  3  . simvastatin (ZOCOR) 80 MG tablet Take 0.5 tablets (40 mg total) by mouth at bedtime.  90 tablet  3  . DISCONTD: oxybutynin (DITROPAN) 5 MG tablet Take 5 mg by mouth 2 (two) times daily.          Allergies  Allergen Reactions  . Procaine Hcl     REACTION: hallucinations    Family History  Problem Relation Age of Onset  .  Cancer Mother     Ovarian Cancer  . Stroke Father   . Diabetes Sister   . Stroke Sister   . Diabetes Brother   . Diabetes Sister   . Hypertension Other   . Hyperlipidemia Other     BP 124/64  Pulse 93  Temp 98.2 F (36.8 C) (Oral)  Resp 16  Wt 160 lb (72.576 kg)  SpO2 95%  Review of Systems Denies LOC    Objective:   Physical Exam VITAL SIGNS:  See vs page GENERAL: no distress Pulses: dorsalis pedis intact bilat.   Feet: no deformity.  no ulcer on the feet.  feet are of normal color and temp.  no edema Neuro: sensation is intact to touch on the feet.      Assessment & Plan:  Dyslipidemia, therapy limited by pt's request for least expensive meds DM, with ? Of hypoglycemia

## 2011-10-09 NOTE — Patient Instructions (Addendum)
blood tests are being requested for you today.  You will receive a letter with results. i have sent a prescription to [facility], for cholesterol medication. check your blood sugar once a day.  vary the time of day when you check, between before the 3 meals, and at bedtime.  also check if you have symptoms of your blood sugar being too high or too low.  please keep a record of the readings and bring it to your next appointment here.  please call us sooner if your blood sugar goes below 70, or if you have a lot of readings over 200. Please come back for a follow-up appointment in 2 months.

## 2011-10-20 ENCOUNTER — Encounter: Payer: Self-pay | Admitting: Endocrinology

## 2011-12-11 ENCOUNTER — Ambulatory Visit: Payer: Self-pay | Admitting: Endocrinology

## 2011-12-12 ENCOUNTER — Encounter: Payer: Self-pay | Admitting: Endocrinology

## 2011-12-12 ENCOUNTER — Ambulatory Visit (INDEPENDENT_AMBULATORY_CARE_PROVIDER_SITE_OTHER): Payer: Medicare Other | Admitting: Endocrinology

## 2011-12-12 VITALS — BP 126/62 | HR 61 | Temp 97.8°F | Resp 16 | Ht 62.5 in | Wt 158.0 lb

## 2011-12-12 DIAGNOSIS — E119 Type 2 diabetes mellitus without complications: Secondary | ICD-10-CM

## 2011-12-12 MED ORDER — LACTULOSE 20 GM/30ML PO SOLN: 30.0000 mL | Freq: Three times a day (TID) | ORAL | Status: DC

## 2011-12-12 NOTE — Patient Instructions (Addendum)
blood tests are being requested for you today.  We'll contact you with results. check your blood sugar once a day.  vary the time of day when you check, between before the 3 meals, and at bedtime.  also check if you have symptoms of your blood sugar being too high or too low.  please keep a record of the readings and bring it to your next appointment here.  please call us sooner if your blood sugar goes below 70, or if you have a lot of readings over 200. Please come back for a follow-up appointment in 3 months.  i have sent a prescription to your pharmacy, for a liquid to help the bowels.   Please continue the same blood pressure mediations.

## 2011-12-12 NOTE — Progress Notes (Signed)
Subjective:    Patient ID: Yvonne Reynolds, female    DOB: 1945-12-28, 66 y.o.   MRN: [id number]  HPI The state of at least three ongoing medical problems is addressed today, with interval history of each noted here: Pt returns for f/u of insulin-requiring DM (dx'ed 1995, complicated by retinopathy and nephropathy).  no cbg record, but states vary from 60-200's.  It is in general higher as the day goes on. Chronic constipation: Pt says unchanged HTN: she tolerates zestril well. Past Medical History  Diagnosis Date  . 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 DIABETES MELLITUS, TYPE II 12/21/2006  . DIABETIC  RETINOPATHY 12/22/2006  . HYPERLIPIDEMIA 12/22/2006  . DEPRESSION 06/26/2006  . OBSTRUCTIVE SLEEP APNEA 12/22/2006    no CPAP  . HYPERTENSION 06/26/2006  . ALLERGIC RHINITIS 12/22/2006  . CHRONIC OBSTRUCTIVE PULMONARY DISEASE, ACUTE EXACERBATION 08/24/2007  . Reflux esophagitis 12/22/2006  . GERD 06/26/2006    with EE  . BARRETTS ESOPHAGUS 12/22/2006    without dysplasia  . HIATAL HERNIA 12/22/2006  . OSTEOARTHRITIS 06/26/2006  . OSTEOPOROSIS 06/26/2006    Past Surgical History  Procedure Date  . Abdominal hysterectomy   . Esophagogastroduodenoscopy 05/07/2004  . Cesarean section   . Wrist surgery     s/p left  after fall  . Incontinence surgery     s/p    History   Social History  . Marital Status: Widowed    Spouse Name: N/A    Number of Children: N/A  . Years of Education: N/A   Occupational History  . 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 Smoking status: Former Smoker -- 1.0 packs/day    Types: Cigarettes    Quit date: 11/11/2011  . Smokeless tobacco: Not on file  . Alcohol Use: No  . Drug Use: No  . Sexually Active: Not on file   Other Topics Concern  . Not on file   Social History Narrative   WidowedDaily Caffeine Use-2 cups daily  or morePt does not get regular exercise[employer], 3rd shift    Current Outpatient Prescriptions on File Prior to Visit  Medication Sig Dispense Refill  . calcium carbonate (OS-CAL) 600 MG TABS Take 600 mg by mouth 2 (two) times daily with a meal.      . citalopram (CELEXA) 20 MG tablet Take 1 tablet (20 mg total) by mouth 2 (two) times daily.  180 tablet  1  . glucose blood (ONE TOUCH ULTRA TEST) test strip Two times a day dx 250.01       . insulin NPH (HUMULIN N,NOVOLIN N) 100 UNIT/ML injection Inject 70 Units into the skin every morning.       Marland Kitchen lisinopril (PRINIVIL,ZESTRIL) 10 MG tablet Take 1 tablet (10 mg total) by mouth daily.  30 tablet  11  . omeprazole-sodium bicarbonate (ZEGERID) 40-1100 MG per capsule Take 1 capsule by mouth 2 (two) times daily. For heartburn       . RELION INSULIN SYR 0.5CC/29G 29G X 1/2" 0.5 ML MISC USE AS DIRECTED  100 each  3  . simvastatin (ZOCOR) 80 MG tablet Take 0.5 tablets (40 mg total) by mouth at bedtime.  90 tablet  3  . vitamin E (VITAMIN E) 400 UNIT capsule Take 400 Units by mouth daily.      . [DISCONTINUED] oxybutynin (DITROPAN) 5 MG tablet Take 5 mg by  mouth 2 (two) times daily.          Allergies  Allergen Reactions  . Procaine Hcl     REACTION: hallucinations    Family History  Problem Relation Age of Onset  . Cancer Mother     Ovarian Cancer  . Stroke Father   . Diabetes Sister   . Stroke Sister   . Diabetes Brother   . Diabetes Sister   . Hypertension Other   . Hyperlipidemia Other     BP 126/62  Pulse 61  Temp 97.8 F (36.6 C) (Oral)  Resp 16  Ht 5' 2.5" (1.588 m)  Wt 158 lb (71.668 kg)  BMI 28.44 kg/m2  SpO2 97%    Review of Systems denies hypoglycemia and brbpr    Objective:   Physical Exam VITAL SIGNS:  See vs page GENERAL: no distress SKIN:  Insulin injection sites at the anterior abdomen are normal   Lab Results  Component Value Date   HGBA1C 10.1* 06/06/2011      Assessment & Plan:  DM, needs  increased rx Constipation, needs increased rx HTN, well-controlled

## 2011-12-19 ENCOUNTER — Other Ambulatory Visit: Payer: Self-pay | Admitting: Endocrinology

## 2011-12-19 MED ORDER — INSULIN NPH (HUMAN) (ISOPHANE) 100 UNIT/ML ~~LOC~~ SUSP: 70.0000 [IU] | SUBCUTANEOUS | Status: DC

## 2011-12-19 NOTE — Telephone Encounter (Signed)
Pt forgot to ask for refills of her insulin when she was here the other week, would like refills sent to [facility].  Also she wants you to know she waited for over an hour to have her blood drawn the other week and finally just left without having it drawn.

## 2011-12-19 NOTE — Telephone Encounter (Signed)
Refills for insulin sent to [facility]. [facility] notified of problem of lab work not being done and patient waiting.

## 2011-12-29 ENCOUNTER — Other Ambulatory Visit: Payer: Self-pay | Admitting: Endocrinology

## 2011-12-30 LAB — HEMOGLOBIN A1C
Hgb A1c MFr Bld: 12.7 % — ABNORMAL HIGH (ref <5.7)
Mean Plasma Glucose: 318 mg/dL — ABNORMAL HIGH (ref <117)

## 2012-01-04 ENCOUNTER — Other Ambulatory Visit: Payer: Self-pay | Admitting: Endocrinology

## 2012-01-05 ENCOUNTER — Telehealth: Payer: Self-pay | Admitting: Endocrinology

## 2012-01-05 MED ORDER — INSULIN NPH (HUMAN) (ISOPHANE) 100 UNIT/ML ~~LOC~~ SUSP: 80.0000 [IU] | Freq: Every day | SUBCUTANEOUS | Status: DC

## 2012-01-05 NOTE — Telephone Encounter (Signed)
Medication quanity and refills for patient corrected at [facility] pharmacy on [address]. Humalin 80 units before breakfast. Dispense 30ml with 3 refills.

## 2012-03-04 ENCOUNTER — Other Ambulatory Visit: Payer: Self-pay | Admitting: *Deleted

## 2012-03-04 ENCOUNTER — Other Ambulatory Visit: Payer: Self-pay | Admitting: Endocrinology

## 2012-03-04 ENCOUNTER — Telehealth: Payer: Self-pay | Admitting: Endocrinology

## 2012-03-04 MED ORDER — LISINOPRIL 10 MG PO TABS: 10.0000 mg | ORAL_TABLET | Freq: Every day | ORAL | Status: DC

## 2012-03-04 MED ORDER — CITALOPRAM HYDROBROMIDE 20 MG PO TABS: 20.0000 mg | ORAL_TABLET | Freq: Two times a day (BID) | ORAL | Status: DC

## 2012-03-04 MED ORDER — INSULIN NPH (HUMAN) (ISOPHANE) 100 UNIT/ML ~~LOC~~ SUSP: 80.0000 [IU] | Freq: Every day | SUBCUTANEOUS | Status: DC

## 2012-03-04 NOTE — Telephone Encounter (Signed)
Needs refill x 1 month each for Lisinopril, generic Celexa and one vial Insulin. Pt says her insurance no longer covers Novolin, she needs an alternate brand called in. Uses [facility]. Call back number for pt is [phone] / Sherri S.

## 2012-03-04 NOTE — Telephone Encounter (Signed)
PATIENT NOTIFIED OF CHANGE IN INSULIN TO HUMULIN TO [facility].

## 2012-03-04 NOTE — Telephone Encounter (Signed)
LISINOPRIL AND CITILOPRAM  HAVE BEEN SENT TO PATIENT PHARMACY, [facility]. PLEASE ADVISE ON INSULIN REFILL. PATIENT IS USING [employer] INSURANCE. WAS NOT SURE WHAT THEY COVER.Yvonne Reynolds # [id number].

## 2012-03-04 NOTE — Addendum Note (Signed)
Addended by: Elnora Morrison on: 03/04/2012 04:04 PM   Modules accepted: Orders

## 2012-03-04 NOTE — Addendum Note (Signed)
Addended by: Romero Belling on: 03/04/2012 04:16 PM   Modules accepted: Orders

## 2012-03-04 NOTE — Telephone Encounter (Signed)
novolin was changed to humulin, and rx was sent

## 2012-03-16 ENCOUNTER — Ambulatory Visit (INDEPENDENT_AMBULATORY_CARE_PROVIDER_SITE_OTHER): Payer: Medicare Other | Admitting: Endocrinology

## 2012-03-16 ENCOUNTER — Encounter: Payer: Self-pay | Admitting: Endocrinology

## 2012-03-16 VITALS — BP 142/80 | HR 78 | Wt 166.0 lb

## 2012-03-16 DIAGNOSIS — E119 Type 2 diabetes mellitus without complications: Secondary | ICD-10-CM

## 2012-03-16 MED ORDER — LISINOPRIL 20 MG PO TABS: 20.0000 mg | ORAL_TABLET | Freq: Every day | ORAL | Status: DC

## 2012-03-16 MED ORDER — SIMVASTATIN 80 MG PO TABS: 40.0000 mg | ORAL_TABLET | Freq: Every day | ORAL | Status: DC

## 2012-03-16 MED ORDER — OMEPRAZOLE-SODIUM BICARBONATE 40-1100 MG PO CAPS: 1.0000 | ORAL_CAPSULE | Freq: Two times a day (BID) | ORAL | Status: DC

## 2012-03-16 NOTE — Patient Instructions (Addendum)
blood tests are being requested for you today.  We'll contact you with results. check your blood sugar once a day.  vary the time of day when you check, between before the 3 meals, and at bedtime.  also check if you have symptoms of your blood sugar being too high or too low.  please keep a record of the readings and bring it to your next appointment here.  please call us sooner if your blood sugar goes below 70, or if you have a lot of readings over 200. Please come back for a regular physical appointment in 3 months.   Please increase the lisinopril to 20 MG daily.

## 2012-03-16 NOTE — Progress Notes (Signed)
Subjective:    Patient ID: Yvonne Reynolds, female    DOB: [birth year], 67 y.o.   MRN: [id number]  HPI The state of at least three ongoing medical problems is addressed today, with interval history of each noted here: Pt returns for f/u of insulin-requiring DM (dx'ed 1995, complicated by retinopathy and nephropathy).  no cbg record, but states vary from 60-300.  There is no trend throughout the day.  Chronic constipation: lactulose works well. HTN: she denies dizziness.  Past Medical History  Diagnosis Date  . 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 DIABETES MELLITUS, TYPE II 12/21/2006  . DIABETIC  RETINOPATHY 12/22/2006  . HYPERLIPIDEMIA 12/22/2006  . DEPRESSION 06/26/2006  . OBSTRUCTIVE SLEEP APNEA 12/22/2006    no CPAP  . HYPERTENSION 06/26/2006  . ALLERGIC RHINITIS 12/22/2006  . CHRONIC OBSTRUCTIVE PULMONARY DISEASE, ACUTE EXACERBATION 08/24/2007  . Reflux esophagitis 12/22/2006  . GERD 06/26/2006    with EE  . BARRETTS ESOPHAGUS 12/22/2006    without dysplasia  . HIATAL HERNIA 12/22/2006  . OSTEOARTHRITIS 06/26/2006  . OSTEOPOROSIS 06/26/2006    Past Surgical History  Procedure Laterality Date  . Abdominal hysterectomy    . Esophagogastroduodenoscopy  05/07/2004  . Cesarean section    . Wrist surgery      s/p left  after fall  . Incontinence surgery      s/p    History   Social History  . Marital Status: Widowed    Spouse Name: N/A    Number of Children: N/A  . Years of Education: N/A   Occupational History  . 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 Smoking status: Former Smoker -- 1.00 packs/day    Types: Cigarettes    Quit date: 11/11/2011  . Smokeless tobacco: Not on file  . Alcohol Use: No  . Drug Use: No  . Sexually Active: Not on file   Other Topics Concern  . Not on file   Social History Narrative   Widowed   Daily Caffeine  Use-2 cups daily or more   Pt does not get regular exercise   [employer], 3rd shift    Current Outpatient Prescriptions on File Prior to Visit  Medication Sig Dispense Refill  . calcium carbonate (OS-CAL) 600 MG TABS Take 600 mg by mouth 2 (two) times daily with a meal.      . citalopram (CELEXA) 20 MG tablet Take 1 tablet (20 mg total) by mouth 2 (two) times daily.  180 tablet  3  . glucose blood (ONE TOUCH ULTRA TEST) test strip Two times a day dx 250.01       . Lactulose 20 GM/30ML SOLN Take 30 mLs (20 g total) by mouth 3 (three) times daily.  960 mL  5  . RELION INSULIN SYR 0.5CC/29G 29G X 1/2" 0.5 ML MISC USE AS DIRECTED  100 each  3  . vitamin E (VITAMIN E) 400 UNIT capsule Take 400 Units by mouth daily.      . [DISCONTINUED] oxybutynin (DITROPAN) 5 MG tablet Take 5 mg by mouth 2 (two) times daily.         No current facility-administered medications on file prior to visit.    Allergies  Allergen Reactions  . Procaine Hcl     REACTION: hallucinations    Family History  Problem Relation Age of Onset  . Cancer Mother  Ovarian Cancer  . Stroke Father   . Diabetes Sister   . Stroke Sister   . Diabetes Brother   . Diabetes Sister   . Hypertension Other   . Hyperlipidemia Other     BP 142/80  Pulse 78  Wt 166 lb (75.297 kg)  BMI 29.86 kg/m2  SpO2 96%  Review of Systems Denies cough and LOC    Objective:   Physical Exam VITAL SIGNS:  See vs page GENERAL: no distress Pulses: dorsalis pedis intact bilat.   Feet: no deformity.  no ulcer on the feet.  feet are of normal color and temp.  no edema Neuro: sensation is intact to touch on the feet.     Lab Results  Component Value Date   HGBA1C 10.0* 03/16/2012      Assessment & Plan:  DM: needs increased rx HTN: needs increased rx Constipation, well-controlled

## 2012-06-29 ENCOUNTER — Ambulatory Visit (INDEPENDENT_AMBULATORY_CARE_PROVIDER_SITE_OTHER): Payer: Medicare Other | Admitting: Endocrinology

## 2012-06-29 ENCOUNTER — Encounter: Payer: Self-pay | Admitting: Endocrinology

## 2012-06-29 ENCOUNTER — Other Ambulatory Visit: Payer: Self-pay

## 2012-06-29 VITALS — BP 124/74 | HR 90 | Ht 62.0 in | Wt 174.0 lb

## 2012-06-29 DIAGNOSIS — Z0000 Encounter for general adult medical examination without abnormal findings: Secondary | ICD-10-CM

## 2012-06-29 DIAGNOSIS — I10 Essential (primary) hypertension: Secondary | ICD-10-CM

## 2012-06-29 DIAGNOSIS — E785 Hyperlipidemia, unspecified: Secondary | ICD-10-CM

## 2012-06-29 DIAGNOSIS — R079 Chest pain, unspecified: Secondary | ICD-10-CM

## 2012-06-29 DIAGNOSIS — M25519 Pain in unspecified shoulder: Secondary | ICD-10-CM

## 2012-06-29 DIAGNOSIS — E119 Type 2 diabetes mellitus without complications: Secondary | ICD-10-CM

## 2012-06-29 DIAGNOSIS — Z79899 Other long term (current) drug therapy: Secondary | ICD-10-CM

## 2012-06-29 DIAGNOSIS — M810 Age-related osteoporosis without current pathological fracture: Secondary | ICD-10-CM

## 2012-06-29 DIAGNOSIS — M25512 Pain in left shoulder: Secondary | ICD-10-CM

## 2012-06-29 LAB — CBC WITH DIFFERENTIAL/PLATELET
Basophils Absolute: 0.1 10*3/uL (ref 0.0–0.1)
Basophils Relative: 0.7 % (ref 0.0–3.0)
Hemoglobin: 11.2 g/dL — ABNORMAL LOW (ref 12.0–15.0)
Lymphocytes Relative: 27.1 % (ref 12.0–46.0)
Monocytes Relative: 7.3 % (ref 3.0–12.0)
Neutro Abs: 6.8 10*3/uL (ref 1.4–7.7)
Neutrophils Relative %: 63.8 % (ref 43.0–77.0)
RBC: 4.18 Mil/uL (ref 3.87–5.11)

## 2012-06-29 LAB — MICROALBUMIN / CREATININE URINE RATIO
Creatinine,U: 48.7 mg/dL
Microalb Creat Ratio: 69.8 mg/g — ABNORMAL HIGH (ref 0.0–30.0)

## 2012-06-29 LAB — BASIC METABOLIC PANEL
BUN: 18 mg/dL (ref 6–23)
CO2: 25 mEq/L (ref 19–32)
Calcium: 8.9 mg/dL (ref 8.4–10.5)
Chloride: 105 mEq/L (ref 96–112)
Creatinine, Ser: 0.8 mg/dL (ref 0.4–1.2)

## 2012-06-29 LAB — LIPID PANEL
Cholesterol: 160 mg/dL (ref 0–200)
HDL: 37.6 mg/dL — ABNORMAL LOW (ref >39.00)
LDL Cholesterol: 96 mg/dL (ref 0–99)
Total CHOL/HDL Ratio: 4
Triglycerides: 130 mg/dL (ref 0.0–149.0)

## 2012-06-29 LAB — URINALYSIS, ROUTINE W REFLEX MICROSCOPIC
Bilirubin Urine: NEGATIVE
Ketones, ur: NEGATIVE
Leukocytes, UA: NEGATIVE
Urine Glucose: 100
Urobilinogen, UA: 0.2 (ref 0.0–1.0)

## 2012-06-29 LAB — HEPATIC FUNCTION PANEL
ALT: 15 U/L (ref 0–35)
AST: 15 U/L (ref 0–37)
Albumin: 3.3 g/dL — ABNORMAL LOW (ref 3.5–5.2)
Total Bilirubin: 0.3 mg/dL (ref 0.3–1.2)

## 2012-06-29 NOTE — Patient Instructions (Addendum)
blood tests are being requested for you today.  We'll contact you with results.   check your blood sugar once a day.  vary the time of day when you check, between before the 3 meals, and at bedtime.  also check if you have symptoms of your blood sugar being too high or too low.  please keep a record of the readings and bring it to your next appointment here.  please call us sooner if your blood sugar goes below 70, or if you have a lot of readings over 200.   please consider these measures for your health:  minimize alcohol.  do not use tobacco products.  have a colonoscopy at least every 10 years from age 72.  Women should have an annual mammogram from age 42.  keep firearms safely stored.  always use seat belts.  have working smoke alarms in your home.  see an eye doctor and dentist regularly.  never drive under the influence of alcohol or drugs (including prescription drugs).  those with fair skin should take precautions against the sun. it is critically important to prevent falling down (keep floor areas well-lit, dry, and free of loose objects.  If you have a cane, walker, or wheelchair, you should use it, even for short trips around the house.  Also, try not to rush) Refer to an orthopedic specialist.  you will receive a phone call, about a day and time for an appointment. Please come back for a follow-up appointment in 3 months

## 2012-06-29 NOTE — Progress Notes (Signed)
Subjective:    Patient ID: Yvonne Reynolds, female    DOB: [birth year], 67 y.o.   MRN: [id number]  HPI Pt is here for regular wellness examination, and is feeling pretty well in general, and says chronic med probs are stable, except as noted below Past Medical History  Diagnosis Date  . 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 DIABETES MELLITUS, TYPE II 12/21/2006  . DIABETIC  RETINOPATHY 12/22/2006  . HYPERLIPIDEMIA 12/22/2006  . DEPRESSION 06/26/2006  . OBSTRUCTIVE SLEEP APNEA 12/22/2006    no CPAP  . HYPERTENSION 06/26/2006  . ALLERGIC RHINITIS 12/22/2006  . CHRONIC OBSTRUCTIVE PULMONARY DISEASE, ACUTE EXACERBATION 08/24/2007  . Reflux esophagitis 12/22/2006  . GERD 06/26/2006    with EE  . BARRETTS ESOPHAGUS 12/22/2006    without dysplasia  . HIATAL HERNIA 12/22/2006  . OSTEOARTHRITIS 06/26/2006  . OSTEOPOROSIS 06/26/2006    Past Surgical History  Procedure Laterality Date  . Abdominal hysterectomy    . Esophagogastroduodenoscopy  05/07/2004  . Cesarean section    . Wrist surgery      s/p left  after fall  . Incontinence surgery      s/p    History   Social History  . Marital Status: Widowed    Spouse Name: N/A    Number of Children: N/A  . Years of Education: N/A   Occupational History  . 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 Smoking status: Former Smoker -- 1.00 packs/day    Types: Cigarettes    Quit date: 11/11/2011  . Smokeless tobacco: Not on file  . Alcohol Use: No  . Drug Use: No  . Sexually Active: Not on file   Other Topics Concern  . Not on file   Social History Narrative   Widowed   Daily Caffeine Use-2 cups daily or more   Pt does not get regular exercise   Works Indust m[employer], 3rd shift    Current Outpatient Prescriptions on File Prior to Visit  Medication Sig Dispense Refill  . calcium carbonate (OS-CAL) 600 MG TABS Take  600 mg by mouth 2 (two) times daily with a meal.      . citalopram (CELEXA) 20 MG tablet Take 1 tablet (20 mg total) by mouth 2 (two) times daily.  180 tablet  3  . glucose blood (ONE TOUCH ULTRA TEST) test strip Two times a day dx 250.01       . insulin NPH (HUMULIN N,NOVOLIN N) 100 UNIT/ML injection Inject 120 Units into the skin daily before breakfast.       . lisinopril (PRINIVIL,ZESTRIL) 20 MG tablet Take 1 tablet (20 mg total) by mouth daily.  90 tablet  3  . omeprazole-sodium bicarbonate (ZEGERID) 40-1100 MG per capsule Take 1 capsule by mouth 2 (two) times daily. For heartburn  180 capsule  3  . RELION INSULIN SYR 0.5CC/29G 29G X 1/2" 0.5 ML MISC USE AS DIRECTED  100 each  3  . simvastatin (ZOCOR) 80 MG tablet Take 0.5 tablets (40 mg total) by mouth at bedtime.  45 tablet  3  . vitamin E (VITAMIN E) 400 UNIT capsule Take 400 Units by mouth daily.      . Lactulose 20 GM/30ML SOLN Take 30 mLs (20 g total) by mouth 3 (three) times daily.  960 mL  5  . [DISCONTINUED] oxybutynin (DITROPAN) 5 MG tablet Take 5 mg  by mouth 2 (two) times daily.         No current facility-administered medications on file prior to visit.    Allergies  Allergen Reactions  . Procaine Hcl     REACTION: hallucinations    Family History  Problem Relation Age of Onset  . Cancer Mother     Ovarian Cancer  . Stroke Father   . Diabetes Sister   . Stroke Sister   . Diabetes Brother   . Diabetes Sister   . Hypertension Other   . Hyperlipidemia Other     BP 124/74  Pulse 90  Ht 5\' 2"  (1.575 m)  Wt 174 lb (78.926 kg)  BMI 31.82 kg/m2  SpO2 93%  Review of Systems  Constitutional: Negative for fever.  HENT: Negative for hearing loss.   Eyes: Negative for visual disturbance.  Respiratory:       Doe  Cardiovascular: Negative for chest pain.  Gastrointestinal: Negative for anal bleeding.  Endocrine: Negative for cold intolerance.  Genitourinary: Negative for hematuria.  Musculoskeletal: Negative for  back pain.  Skin: Negative for rash.  Allergic/Immunologic: Positive for environmental allergies.  Neurological: Negative for syncope and headaches.  Hematological: Bruises/bleeds easily.  Psychiatric/Behavioral: Negative for dysphoric mood.       Objective:   Physical Exam VS: see vs page GEN: no distress HEAD: head: no deformity eyes: no periorbital swelling, no proptosis external nose and ears are normal mouth: no lesion seen NECK: supple, thyroid is not enlarged CHEST WALL: no deformity LUNGS:  Clear to auscultation BREASTS:  sees gyn CV: reg rate and rhythm, no murmur ABD: abdomen is soft, nontender.  no hepatosplenomegaly.  not distended.  no hernia GENITALIA/RECTAL: sees gyn MUSCULOSKELETAL: muscle bulk and strength are grossly normal.  no obvious joint swelling.  gait is normal and steady EXTEMITIES: no deformity.  no ulcer on the feet.  feet are of normal color and temp.  no edema PULSES: dorsalis pedis intact bilat.  no carotid bruit NEURO:  cn 2-12 grossly intact.   readily moves all 4's.  sensation is intact to touch on the feet SKIN:  Normal texture and temperature.  No rash or suspicious lesion is visible.   NODES:  None palpable at the neck PSYCH: alert, oriented x3.  Does not appear anxious nor depressed.     Assessment & Plan:  Wellness visit today, with problems stable, except as noted.  we discussed code status.  pt requests full code, but would not want to be started or maintained on artificial life-support measures if there was not a reasonable chance of recovery.      SEPARATE EVALUATION FOLLOWS--EACH PROBLEM HERE IS NEW, NOT RESPONDING TO TREATMENT, OR POSES SIGNIFICANT RISK TO THE PATIENT'S HEALTH: HISTORY OF THE PRESENT ILLNESS: Pt states few years of moderate pain at the left shoulder pain, but no assoc numbness.  She fell a few times, a few years ago.  Pt returns for f/u of insulin-requiring DM (dx'ed 1995, complicated by retinopathy and  nephropathy).  no cbg record, but states vary from 60-300.  no cbg record, but states cbg's are well-controlled.   PAST MEDICAL HISTORY reviewed and up to date today REVIEW OF SYSTEMS: denies hypoglycemia.  She has gained weight.  PHYSICAL EXAMINATION: VITAL SIGNS:  See vs page GENERAL: no distress Left shoulder: full rom, without effect on the pain.   LAB/XRAY RESULTS: Lab Results  Component Value Date   WBC 10.6* 06/29/2012   HGB 11.2* 06/29/2012   HCT 34.5*  06/29/2012   PLT 331.0 06/29/2012   GLUCOSE 180* 06/29/2012   CHOL 160 06/29/2012   TRIG 130.0 06/29/2012   HDL 37.60* 06/29/2012   LDLDIRECT 185.6 03/14/2009   LDLCALC 96 06/29/2012   ALT 15 06/29/2012   AST 15 06/29/2012   NA 137 06/29/2012   K 4.7 06/29/2012   CL 105 06/29/2012   CREATININE 0.8 06/29/2012   BUN 18 06/29/2012   CO2 25 06/29/2012   TSH 0.81 06/29/2012   INR 0.84 06/18/2009   HGBA1C 10.1* 06/29/2012   MICROALBUR 34.0* 06/29/2012  IMPRESSION: DM: she needs increased rx.  This insulin regimen was chosen from multiple options, due to its simplicity.  The benefits of glycemic control must be weighed against the risks of hypoglycemia.   Shoulder pain, persistent. PLAN: See instruction page

## 2012-06-30 ENCOUNTER — Telehealth: Payer: Self-pay | Admitting: Endocrinology

## 2012-06-30 NOTE — Telephone Encounter (Signed)
Pt called back to let the office know she did receive the message/ Sherri S.

## 2012-07-13 ENCOUNTER — Other Ambulatory Visit: Payer: Self-pay | Admitting: *Deleted

## 2012-07-13 MED ORDER — CITALOPRAM HYDROBROMIDE 20 MG PO TABS: 20.0000 mg | ORAL_TABLET | Freq: Two times a day (BID) | ORAL | Status: DC

## 2012-07-13 MED ORDER — INSULIN NPH (HUMAN) (ISOPHANE) 100 UNIT/ML ~~LOC~~ SUSP: 120.0000 [IU] | Freq: Every day | SUBCUTANEOUS | Status: DC

## 2012-07-14 ENCOUNTER — Telehealth: Payer: Self-pay | Admitting: Endocrinology

## 2012-07-14 ENCOUNTER — Other Ambulatory Visit: Payer: Self-pay

## 2012-07-14 ENCOUNTER — Other Ambulatory Visit: Payer: Self-pay | Admitting: *Deleted

## 2012-07-14 MED ORDER — INSULIN NPH (HUMAN) (ISOPHANE) 100 UNIT/ML ~~LOC~~ SUSP: 120.0000 [IU] | Freq: Every day | SUBCUTANEOUS | Status: DC

## 2012-07-14 NOTE — Telephone Encounter (Signed)
Pt called in this morning, phone was very muffled and I had a hard time understanding her.  Patient states that she needs a refill of her insulin.  She stated that she receives it through [facility].  Please refill this rx per patient, maybe even give her a call to see if she wants it to come in the mail or pick up at her local pharmacy. Please advise. Thank you

## 2012-07-14 NOTE — Telephone Encounter (Signed)
Rx went to [facility]. Pt no longer uses [facility]. Re-sending to [facility] per pt.

## 2012-07-15 ENCOUNTER — Telehealth: Payer: Self-pay | Admitting: *Deleted

## 2012-07-15 MED ORDER — INSULIN NPH (HUMAN) (ISOPHANE) 100 UNIT/ML ~~LOC~~ SUSP: 120.0000 [IU] | Freq: Every day | SUBCUTANEOUS | Status: DC

## 2012-07-15 NOTE — Telephone Encounter (Signed)
Resending rx for a 90 day supply.

## 2012-07-16 ENCOUNTER — Telehealth: Payer: Self-pay | Admitting: Endocrinology

## 2012-07-16 NOTE — Telephone Encounter (Signed)
Please contact pharmacy re: Zegerid & Celexa drug interaction. Use ref [id number].

## 2012-07-23 ENCOUNTER — Emergency Department (HOSPITAL_COMMUNITY): Payer: Medicare Other

## 2012-07-23 ENCOUNTER — Other Ambulatory Visit: Payer: Self-pay

## 2012-07-23 ENCOUNTER — Encounter (HOSPITAL_COMMUNITY): Payer: Self-pay | Admitting: *Deleted

## 2012-07-23 ENCOUNTER — Inpatient Hospital Stay (HOSPITAL_COMMUNITY)
Admission: EM | Admit: 2012-07-23 | Discharge: 2012-07-28 | DRG: 494 | Disposition: A | Discharge status: Skilled Nursing Facility | Payer: Medicare Other | Attending: Specialist | Admitting: Specialist

## 2012-07-23 ENCOUNTER — Encounter (HOSPITAL_COMMUNITY)
Admission: EM | Disposition: A | Discharge status: Skilled Nursing Facility | Payer: Self-pay | Source: Home / Self Care | Attending: Internal Medicine

## 2012-07-23 ENCOUNTER — Emergency Department (HOSPITAL_COMMUNITY): Payer: Medicare Other | Admitting: *Deleted

## 2012-07-23 DIAGNOSIS — M810 Age-related osteoporosis without current pathological fracture: Secondary | ICD-10-CM | POA: Diagnosis present

## 2012-07-23 DIAGNOSIS — Z79899 Other long term (current) drug therapy: Secondary | ICD-10-CM

## 2012-07-23 DIAGNOSIS — R109 Unspecified abdominal pain: Secondary | ICD-10-CM

## 2012-07-23 DIAGNOSIS — Y92009 Unspecified place in unspecified non-institutional (private) residence as the place of occurrence of the external cause: Secondary | ICD-10-CM

## 2012-07-23 DIAGNOSIS — I10 Essential (primary) hypertension: Secondary | ICD-10-CM | POA: Diagnosis present

## 2012-07-23 DIAGNOSIS — K449 Diaphragmatic hernia without obstruction or gangrene: Secondary | ICD-10-CM | POA: Diagnosis present

## 2012-07-23 DIAGNOSIS — Z87891 Personal history of nicotine dependence: Secondary | ICD-10-CM

## 2012-07-23 DIAGNOSIS — Z794 Long term (current) use of insulin: Secondary | ICD-10-CM

## 2012-07-23 DIAGNOSIS — J309 Allergic rhinitis, unspecified: Secondary | ICD-10-CM | POA: Diagnosis present

## 2012-07-23 DIAGNOSIS — E785 Hyperlipidemia, unspecified: Secondary | ICD-10-CM | POA: Diagnosis present

## 2012-07-23 DIAGNOSIS — E11319 Type 2 diabetes mellitus with unspecified diabetic retinopathy without macular edema: Secondary | ICD-10-CM | POA: Diagnosis present

## 2012-07-23 DIAGNOSIS — F3289 Other specified depressive episodes: Secondary | ICD-10-CM | POA: Diagnosis present

## 2012-07-23 DIAGNOSIS — Z860100 Personal history of colon polyps, unspecified: Secondary | ICD-10-CM

## 2012-07-23 DIAGNOSIS — E119 Type 2 diabetes mellitus without complications: Secondary | ICD-10-CM

## 2012-07-23 DIAGNOSIS — K2270 Barrett's esophagus without dysplasia: Secondary | ICD-10-CM | POA: Diagnosis present

## 2012-07-23 DIAGNOSIS — Y998 Other external cause status: Secondary | ICD-10-CM

## 2012-07-23 DIAGNOSIS — E1139 Type 2 diabetes mellitus with other diabetic ophthalmic complication: Secondary | ICD-10-CM | POA: Diagnosis present

## 2012-07-23 DIAGNOSIS — F329 Major depressive disorder, single episode, unspecified: Secondary | ICD-10-CM | POA: Diagnosis present

## 2012-07-23 DIAGNOSIS — S82852A Displaced trimalleolar fracture of left lower leg, initial encounter for closed fracture: Secondary | ICD-10-CM

## 2012-07-23 DIAGNOSIS — S82853A Displaced trimalleolar fracture of unspecified lower leg, initial encounter for closed fracture: Principal | ICD-10-CM | POA: Diagnosis present

## 2012-07-23 DIAGNOSIS — W010XXA Fall on same level from slipping, tripping and stumbling without subsequent striking against object, initial encounter: Secondary | ICD-10-CM | POA: Diagnosis present

## 2012-07-23 DIAGNOSIS — M1990 Unspecified osteoarthritis, unspecified site: Secondary | ICD-10-CM | POA: Diagnosis present

## 2012-07-23 DIAGNOSIS — G4733 Obstructive sleep apnea (adult) (pediatric): Secondary | ICD-10-CM | POA: Diagnosis present

## 2012-07-23 HISTORY — PX: ORIF ANKLE FRACTURE: SHX5408

## 2012-07-23 LAB — BASIC METABOLIC PANEL
CO2: 24 mEq/L (ref 19–32)
Calcium: 8.8 mg/dL (ref 8.4–10.5)
Chloride: 101 mEq/L (ref 96–112)
Glucose, Bld: 231 mg/dL — ABNORMAL HIGH (ref 70–99)
Sodium: 136 mEq/L (ref 135–145)

## 2012-07-23 LAB — CBC WITH DIFFERENTIAL/PLATELET
Eosinophils Relative: 1 % (ref 0–5)
HCT: 33.4 % — ABNORMAL LOW (ref 36.0–46.0)
Lymphocytes Relative: 12 % (ref 12–46)
Lymphs Abs: 2 10*3/uL (ref 0.7–4.0)
MCV: 80.3 fL (ref 78.0–100.0)
Platelets: 360 10*3/uL (ref 150–400)
RBC: 4.16 MIL/uL (ref 3.87–5.11)
WBC: 17.6 10*3/uL — ABNORMAL HIGH (ref 4.0–10.5)

## 2012-07-23 SURGERY — OPEN REDUCTION INTERNAL FIXATION (ORIF) ANKLE FRACTURE
Anesthesia: General | Site: Ankle | Laterality: Left | Wound class: Clean

## 2012-07-23 MED ORDER — SODIUM CHLORIDE 0.9 % IV SOLN
20.0000 mg | INTRAVENOUS | Status: DC | PRN
  Administered 2012-07-23: 50 ug/min via INTRAVENOUS

## 2012-07-23 MED ORDER — ONDANSETRON HCL 4 MG/2ML IJ SOLN: 4.0000 mg | Freq: Four times a day (QID) | INTRAMUSCULAR | Status: DC | PRN

## 2012-07-23 MED ORDER — PROMETHAZINE HCL 25 MG/ML IJ SOLN: 6.2500 mg | INTRAMUSCULAR | Status: DC | PRN

## 2012-07-23 MED ORDER — PROPOFOL 10 MG/ML IV EMUL
INTRAVENOUS | Status: AC
  Administered 2012-07-23: 20 mg via INTRAVENOUS
  Filled 2012-07-23: qty 100

## 2012-07-23 MED ORDER — OXYCODONE-ACETAMINOPHEN 5-325 MG PO TABS: 1.0000 | ORAL_TABLET | Freq: Four times a day (QID) | ORAL | Status: DC | PRN

## 2012-07-23 MED ORDER — DOCUSATE SODIUM 100 MG PO CAPS
100.0000 mg | ORAL_CAPSULE | Freq: Two times a day (BID) | ORAL | Status: DC
  Administered 2012-07-24 – 2012-07-28 (×9): 100 mg via ORAL
  Filled 2012-07-23 (×11): qty 1

## 2012-07-23 MED ORDER — 0.9 % SODIUM CHLORIDE (POUR BTL) OPTIME
TOPICAL | Status: DC | PRN
  Administered 2012-07-23: 1000 mL

## 2012-07-23 MED ORDER — BUPIVACAINE-EPINEPHRINE PF 0.5-1:200000 % IJ SOLN
INTRAMUSCULAR | Status: DC | PRN
  Administered 2012-07-23: 10 mL
  Administered 2012-07-23: 30 mL

## 2012-07-23 MED ORDER — PROPOFOL 10 MG/ML IV EMUL
INTRAVENOUS | Status: AC | PRN
  Administered 2012-07-23: 80 mg/kg/h via INTRAVENOUS

## 2012-07-23 MED ORDER — PROPOFOL 10 MG/ML IV EMUL
INTRAVENOUS | Status: AC | PRN
  Administered 2012-07-23: 80 mg/kg/h via INTRAVENOUS
  Administered 2012-07-23: 20 mg/kg/h via INTRAVENOUS

## 2012-07-23 MED ORDER — METOCLOPRAMIDE HCL 5 MG/ML IJ SOLN
10.0000 mg | Freq: Once | INTRAMUSCULAR | Status: AC
  Administered 2012-07-23: 10 mg via INTRAVENOUS
  Filled 2012-07-23 (×2): qty 2

## 2012-07-23 MED ORDER — MORPHINE SULFATE 2 MG/ML IJ SOLN
1.0000 mg | INTRAMUSCULAR | Status: DC | PRN
  Administered 2012-07-24 (×5): 1 mg via INTRAVENOUS
  Filled 2012-07-23 (×4): qty 1

## 2012-07-23 MED ORDER — ATORVASTATIN CALCIUM 40 MG PO TABS
40.0000 mg | ORAL_TABLET | Freq: Every day | ORAL | Status: DC
  Administered 2012-07-24 – 2012-07-27 (×4): 40 mg via ORAL
  Filled 2012-07-23 (×5): qty 1

## 2012-07-23 MED ORDER — CITALOPRAM HYDROBROMIDE 20 MG PO TABS
20.0000 mg | ORAL_TABLET | Freq: Two times a day (BID) | ORAL | Status: DC
  Administered 2012-07-24 – 2012-07-28 (×10): 20 mg via ORAL
  Filled 2012-07-23 (×11): qty 1

## 2012-07-23 MED ORDER — HYDROMORPHONE HCL PF 1 MG/ML IJ SOLN
INTRAMUSCULAR | Status: AC
  Administered 2012-07-23: 0.5 mg via INTRAVENOUS
  Filled 2012-07-23: qty 1

## 2012-07-23 MED ORDER — INSULIN ASPART 100 UNIT/ML ~~LOC~~ SOLN
0.0000 [IU] | Freq: Three times a day (TID) | SUBCUTANEOUS | Status: DC
  Administered 2012-07-24: 5 [IU] via SUBCUTANEOUS
  Administered 2012-07-24 (×2): 3 [IU] via SUBCUTANEOUS
  Administered 2012-07-25: 5 [IU] via SUBCUTANEOUS
  Administered 2012-07-25 – 2012-07-26 (×3): 3 [IU] via SUBCUTANEOUS
  Administered 2012-07-26: 5 [IU] via SUBCUTANEOUS
  Administered 2012-07-27: 3 [IU] via SUBCUTANEOUS
  Administered 2012-07-27: 5 [IU] via SUBCUTANEOUS
  Administered 2012-07-27 – 2012-07-28 (×3): 3 [IU] via SUBCUTANEOUS

## 2012-07-23 MED ORDER — PROPOFOL 10 MG/ML IV BOLUS
INTRAVENOUS | Status: AC | PRN
  Administered 2012-07-23: 40 mg via INTRAVENOUS
  Administered 2012-07-23: 20 mg via INTRAVENOUS

## 2012-07-23 MED ORDER — MEPERIDINE HCL 25 MG/ML IJ SOLN: 6.2500 mg | INTRAMUSCULAR | Status: DC | PRN

## 2012-07-23 MED ORDER — DIPHENHYDRAMINE HCL 12.5 MG/5ML PO ELIX: 12.5000 mg | ORAL_SOLUTION | ORAL | Status: DC | PRN

## 2012-07-23 MED ORDER — ONDANSETRON HCL 4 MG/2ML IJ SOLN
4.0000 mg | Freq: Once | INTRAMUSCULAR | Status: AC
  Administered 2012-07-23: 4 mg via INTRAVENOUS
  Filled 2012-07-23: qty 2

## 2012-07-23 MED ORDER — PROPOFOL 10 MG/ML IV BOLUS
1.0000 mg/kg | Freq: Once | INTRAVENOUS | Status: DC
  Filled 2012-07-23: qty 40

## 2012-07-23 MED ORDER — SUCCINYLCHOLINE CHLORIDE 20 MG/ML IJ SOLN
INTRAMUSCULAR | Status: DC | PRN
  Administered 2012-07-23: 100 mg via INTRAVENOUS

## 2012-07-23 MED ORDER — CEFAZOLIN SODIUM-DEXTROSE 2-3 GM-% IV SOLR
INTRAVENOUS | Status: DC | PRN
  Administered 2012-07-23: 2 g via INTRAVENOUS

## 2012-07-23 MED ORDER — EPHEDRINE SULFATE 50 MG/ML IJ SOLN
INTRAMUSCULAR | Status: DC | PRN
  Administered 2012-07-23: 5 mg via INTRAVENOUS

## 2012-07-23 MED ORDER — ONDANSETRON HCL 4 MG PO TABS
4.0000 mg | ORAL_TABLET | Freq: Four times a day (QID) | ORAL | Status: DC | PRN
  Administered 2012-07-26: 4 mg via ORAL
  Filled 2012-07-23: qty 1

## 2012-07-23 MED ORDER — FENTANYL CITRATE 0.05 MG/ML IJ SOLN
INTRAMUSCULAR | Status: DC | PRN
  Administered 2012-07-23 (×4): 50 ug via INTRAVENOUS

## 2012-07-23 MED ORDER — ETOMIDATE 2 MG/ML IV SOLN
10.0000 mg | Freq: Once | INTRAVENOUS | Status: AC
  Administered 2012-07-23: 10 mg via INTRAVENOUS
  Filled 2012-07-23: qty 10

## 2012-07-23 MED ORDER — CEFAZOLIN SODIUM-DEXTROSE 2-3 GM-% IV SOLR
2.0000 g | Freq: Four times a day (QID) | INTRAVENOUS | Status: AC
  Administered 2012-07-24 (×3): 2 g via INTRAVENOUS
  Filled 2012-07-23 (×3): qty 50

## 2012-07-23 MED ORDER — LISINOPRIL 20 MG PO TABS
20.0000 mg | ORAL_TABLET | Freq: Every day | ORAL | Status: DC
  Administered 2012-07-24 – 2012-07-28 (×5): 20 mg via ORAL
  Filled 2012-07-23 (×5): qty 1

## 2012-07-23 MED ORDER — PROPOFOL 10 MG/ML IV EMUL
INTRAVENOUS | Status: AC | PRN
  Administered 2012-07-23: 40 mg/kg/h via INTRAVENOUS
  Administered 2012-07-23: 40 mg via INTRAVENOUS

## 2012-07-23 MED ORDER — PROPOFOL 10 MG/ML IV BOLUS
INTRAVENOUS | Status: DC | PRN
  Administered 2012-07-23: 100 mg via INTRAVENOUS

## 2012-07-23 MED ORDER — PANTOPRAZOLE SODIUM 40 MG PO TBEC
80.0000 mg | DELAYED_RELEASE_TABLET | Freq: Every day | ORAL | Status: DC
  Administered 2012-07-24 – 2012-07-28 (×5): 80 mg via ORAL
  Filled 2012-07-23 (×5): qty 2

## 2012-07-23 MED ORDER — PROPOFOL 10 MG/ML IV BOLUS: 0.5000 mg/kg | Freq: Once | INTRAVENOUS | Status: AC

## 2012-07-23 MED ORDER — CALCIUM CARBONATE 1250 (500 CA) MG PO TABS
2.0000 | ORAL_TABLET | Freq: Every day | ORAL | Status: DC
  Administered 2012-07-24 – 2012-07-28 (×5): 1000 mg via ORAL
  Filled 2012-07-23 (×6): qty 2

## 2012-07-23 MED ORDER — SODIUM CHLORIDE 0.9 % IV SOLN
INTRAVENOUS | Status: DC
  Administered 2012-07-24: 01:00:00 via INTRAVENOUS

## 2012-07-23 MED ORDER — OXYCODONE-ACETAMINOPHEN 5-325 MG PO TABS
1.0000 | ORAL_TABLET | ORAL | Status: DC | PRN
  Administered 2012-07-24 – 2012-07-25 (×5): 2 via ORAL
  Filled 2012-07-23 (×6): qty 2

## 2012-07-23 MED ORDER — ENOXAPARIN SODIUM 40 MG/0.4ML ~~LOC~~ SOLN
40.0000 mg | Freq: Every day | SUBCUTANEOUS | Status: DC
  Administered 2012-07-24 – 2012-07-28 (×5): 40 mg via SUBCUTANEOUS
  Filled 2012-07-23 (×5): qty 0.4

## 2012-07-23 MED ORDER — HYDROMORPHONE HCL PF 1 MG/ML IJ SOLN
1.0000 mg | Freq: Once | INTRAMUSCULAR | Status: AC
  Administered 2012-07-23: 1 mg via INTRAVENOUS
  Filled 2012-07-23: qty 1

## 2012-07-23 MED ORDER — NEOSTIGMINE METHYLSULFATE 1 MG/ML IJ SOLN
INTRAMUSCULAR | Status: DC | PRN
  Administered 2012-07-23: 4 mg via INTRAVENOUS

## 2012-07-23 MED ORDER — ROCURONIUM BROMIDE 100 MG/10ML IV SOLN
INTRAVENOUS | Status: DC | PRN
  Administered 2012-07-23: 30 mg via INTRAVENOUS

## 2012-07-23 MED ORDER — METOCLOPRAMIDE HCL 5 MG/ML IJ SOLN: 5.0000 mg | Freq: Three times a day (TID) | INTRAMUSCULAR | Status: DC | PRN

## 2012-07-23 MED ORDER — ETOMIDATE 2 MG/ML IV SOLN
INTRAVENOUS | Status: AC | PRN
  Administered 2012-07-23: 10 mg via INTRAVENOUS

## 2012-07-23 MED ORDER — LACTATED RINGERS IV SOLN
INTRAVENOUS | Status: DC | PRN
  Administered 2012-07-23 (×2): via INTRAVENOUS

## 2012-07-23 MED ORDER — MIDAZOLAM HCL 2 MG/2ML IJ SOLN: 0.5000 mg | Freq: Once | INTRAMUSCULAR | Status: DC | PRN

## 2012-07-23 MED ORDER — GLYCOPYRROLATE 0.2 MG/ML IJ SOLN
INTRAMUSCULAR | Status: DC | PRN
  Administered 2012-07-23: .6 mg via INTRAVENOUS

## 2012-07-23 MED ORDER — BISACODYL 10 MG RE SUPP
10.0000 mg | Freq: Every day | RECTAL | Status: DC | PRN
  Administered 2012-07-26: 10 mg via RECTAL
  Filled 2012-07-23: qty 1

## 2012-07-23 MED ORDER — MIDAZOLAM HCL 5 MG/5ML IJ SOLN
INTRAMUSCULAR | Status: DC | PRN
  Administered 2012-07-23 (×2): 1 mg via INTRAVENOUS

## 2012-07-23 MED ORDER — POLYETHYLENE GLYCOL 3350 17 G PO PACK
17.0000 g | PACK | Freq: Every day | ORAL | Status: DC | PRN
  Administered 2012-07-25: 17 g via ORAL
  Filled 2012-07-23: qty 1

## 2012-07-23 MED ORDER — CHOLECALCIFEROL 10 MCG (400 UNIT) PO TABS
400.0000 [IU] | ORAL_TABLET | Freq: Every day | ORAL | Status: DC
  Administered 2012-07-24 – 2012-07-28 (×5): 400 [IU] via ORAL
  Filled 2012-07-23 (×5): qty 1

## 2012-07-23 MED ORDER — OXYCODONE HCL 5 MG/5ML PO SOLN: 5.0000 mg | Freq: Once | ORAL | Status: DC | PRN

## 2012-07-23 MED ORDER — PHENYLEPHRINE HCL 10 MG/ML IJ SOLN
INTRAMUSCULAR | Status: DC | PRN
  Administered 2012-07-23 (×2): 80 ug via INTRAVENOUS
  Administered 2012-07-23: 40 ug via INTRAVENOUS

## 2012-07-23 MED ORDER — FLEET ENEMA 7-19 GM/118ML RE ENEM: 1.0000 | ENEMA | Freq: Once | RECTAL | Status: AC | PRN

## 2012-07-23 MED ORDER — HYDROMORPHONE HCL PF 1 MG/ML IJ SOLN
0.2500 mg | INTRAMUSCULAR | Status: DC | PRN
  Administered 2012-07-23: 0.5 mg via INTRAVENOUS

## 2012-07-23 MED ORDER — CEFAZOLIN SODIUM-DEXTROSE 2-3 GM-% IV SOLR: 2.0000 g | INTRAVENOUS | Status: DC

## 2012-07-23 MED ORDER — HYDROCODONE-ACETAMINOPHEN 5-325 MG PO TABS
1.0000 | ORAL_TABLET | ORAL | Status: DC | PRN
  Administered 2012-07-24: 2 via ORAL
  Administered 2012-07-26 (×2): 1 via ORAL
  Administered 2012-07-27 – 2012-07-28 (×3): 2 via ORAL
  Filled 2012-07-23: qty 1
  Filled 2012-07-23 (×4): qty 2
  Filled 2012-07-23: qty 1

## 2012-07-23 MED ORDER — VITAMIN E 180 MG (400 UNIT) PO CAPS
400.0000 [IU] | ORAL_CAPSULE | Freq: Every day | ORAL | Status: DC
  Administered 2012-07-24 – 2012-07-28 (×5): 400 [IU] via ORAL
  Filled 2012-07-23 (×5): qty 1

## 2012-07-23 MED ORDER — METOCLOPRAMIDE HCL 10 MG PO TABS
5.0000 mg | ORAL_TABLET | Freq: Three times a day (TID) | ORAL | Status: DC | PRN
  Administered 2012-07-26: 10 mg via ORAL
  Filled 2012-07-23: qty 1

## 2012-07-23 MED ORDER — OXYCODONE HCL 5 MG PO TABS: 5.0000 mg | ORAL_TABLET | Freq: Once | ORAL | Status: DC | PRN

## 2012-07-23 SURGICAL SUPPLY — 75 items
BANDAGE ELASTIC 4 VELCRO ST LF (GAUZE/BANDAGES/DRESSINGS) ×1 IMPLANT
BANDAGE ELASTIC 6 VELCRO ST LF (GAUZE/BANDAGES/DRESSINGS) IMPLANT
BANDAGE ESMARK 6X9 LF (GAUZE/BANDAGES/DRESSINGS) ×1 IMPLANT
BANDAGE GAUZE ELAST BULKY 4 IN (GAUZE/BANDAGES/DRESSINGS) IMPLANT
BIT DRILL 2.5X110 QC LCP DISP (BIT) ×1 IMPLANT
BIT DRILL CANN 2.7X625 NONSTRL (BIT) ×1 IMPLANT
BIT DRILL QC 3.5X110 (BIT) ×1 IMPLANT
BLADE SURG 10 STRL SS (BLADE) ×1 IMPLANT
BNDG CMPR 9X6 STRL LF SNTH (GAUZE/BANDAGES/DRESSINGS) ×1
BNDG ESMARK 6X9 LF (GAUZE/BANDAGES/DRESSINGS) ×2
CLOTH BEACON ORANGE TIMEOUT ST (SAFETY) ×2 IMPLANT
COVER SURGICAL LIGHT HANDLE (MISCELLANEOUS) ×2 IMPLANT
CUFF TOURNIQUET SINGLE 34IN LL (TOURNIQUET CUFF) IMPLANT
CUFF TOURNIQUET SINGLE 44IN (TOURNIQUET CUFF) IMPLANT
DRAPE OEC MINIVIEW 54X84 (DRAPES) ×1 IMPLANT
DRAPE U-SHAPE 47X51 STRL (DRAPES) ×2 IMPLANT
DRSG ADAPTIC 3X8 NADH LF (GAUZE/BANDAGES/DRESSINGS) IMPLANT
DRSG PAD ABDOMINAL 8X10 ST (GAUZE/BANDAGES/DRESSINGS) IMPLANT
DURAPREP 26ML APPLICATOR (WOUND CARE) IMPLANT
ELECT REM PT RETURN 9FT ADLT (ELECTROSURGICAL) ×2
ELECTRODE REM PT RTRN 9FT ADLT (ELECTROSURGICAL) ×1 IMPLANT
GAUZE XEROFORM 5X9 LF (GAUZE/BANDAGES/DRESSINGS) ×1 IMPLANT
GLOVE BIOGEL PI IND STRL 7.5 (GLOVE) ×1 IMPLANT
GLOVE BIOGEL PI IND STRL 8 (GLOVE) IMPLANT
GLOVE BIOGEL PI INDICATOR 7.5 (GLOVE) ×1
GLOVE BIOGEL PI INDICATOR 8 (GLOVE) ×1
GLOVE ECLIPSE 7.0 STRL STRAW (GLOVE) ×2 IMPLANT
GLOVE ECLIPSE 8.5 STRL (GLOVE) ×2 IMPLANT
GLOVE SURG 8.5 LATEX PF (GLOVE) ×2 IMPLANT
GLOVE SURG SS PI 8.0 STRL IVOR (GLOVE) ×1 IMPLANT
GOWN PREVENTION PLUS LG XLONG (DISPOSABLE) IMPLANT
GOWN PREVENTION PLUS XXLARGE (GOWN DISPOSABLE) ×2 IMPLANT
GOWN STRL NON-REIN LRG LVL3 (GOWN DISPOSABLE) ×4 IMPLANT
GOWN STRL REIN XL XLG (GOWN DISPOSABLE) ×1 IMPLANT
GUIDEWIRE THREADED 150MM (WIRE) ×2 IMPLANT
KIT BASIN OR (CUSTOM PROCEDURE TRAY) ×2 IMPLANT
KIT ROOM TURNOVER OR (KITS) ×2 IMPLANT
MANIFOLD NEPTUNE II (INSTRUMENTS) ×1 IMPLANT
NEEDLE 22X1 1/2 (OR ONLY) (NEEDLE) ×1 IMPLANT
NS IRRIG 1000ML POUR BTL (IV SOLUTION) ×2 IMPLANT
PACK ORTHO EXTREMITY (CUSTOM PROCEDURE TRAY) ×2 IMPLANT
PAD ARMBOARD 7.5X6 YLW CONV (MISCELLANEOUS) ×4 IMPLANT
PAD CAST 4YDX4 CTTN HI CHSV (CAST SUPPLIES) IMPLANT
PADDING CAST COTTON 4X4 STRL (CAST SUPPLIES) ×6
PADDING CAST COTTON 6X4 STRL (CAST SUPPLIES) ×1 IMPLANT
PLATE LCP 3.5 1/3 TUB 6HX69 (Plate) ×1 IMPLANT
SCREW CANC FT 4.0X20 (Screw) ×1 IMPLANT
SCREW CANN S THRD/50 4.0 (Screw) ×1 IMPLANT
SCREW CORTEX 3.5 14MM (Screw) ×1 IMPLANT
SCREW CORTEX 3.5 16MM (Screw) ×1 IMPLANT
SCREW CORTEX 3.5 18MM (Screw) ×1 IMPLANT
SCREW CORTEX 3.5 26MM (Screw) ×1 IMPLANT
SCREW LOCK CORT ST 3.5X14 (Screw) IMPLANT
SCREW LOCK CORT ST 3.5X16 (Screw) IMPLANT
SCREW LOCK CORT ST 3.5X18 (Screw) IMPLANT
SCREW LOCK CORT ST 3.5X26 (Screw) IMPLANT
SCREW SHORT THREAD 4.0X40 (Screw) ×1 IMPLANT
SPLINT FIBERGLASS 3X12 (CAST SUPPLIES) ×1 IMPLANT
SPLINT PLASTER CAST XFAST 5X30 (CAST SUPPLIES) IMPLANT
SPLINT PLASTER XFAST SET 5X30 (CAST SUPPLIES) ×1
SPONGE GAUZE 4X4 12PLY (GAUZE/BANDAGES/DRESSINGS) ×2 IMPLANT
SPONGE LAP 18X18 X RAY DECT (DISPOSABLE) ×1 IMPLANT
SPONGE LAP 4X18 X RAY DECT (DISPOSABLE) ×2 IMPLANT
STAPLER VISISTAT (STAPLE) ×1 IMPLANT
SUCTION FRAZIER TIP 10 FR DISP (SUCTIONS) ×2 IMPLANT
SUT ETHILON 4 0 PS 2 18 (SUTURE) ×2 IMPLANT
SUT VIC AB 0 CT1 27 (SUTURE) ×2
SUT VIC AB 0 CT1 27XBRD ANBCTR (SUTURE) ×2 IMPLANT
SUT VIC AB 2-0 CT1 27 (SUTURE) ×2
SUT VIC AB 2-0 CT1 TAPERPNT 27 (SUTURE) ×2 IMPLANT
SYR CONTROL 10ML LL (SYRINGE) ×1 IMPLANT
TOWEL OR 17X24 6PK STRL BLUE (TOWEL DISPOSABLE) ×2 IMPLANT
TOWEL OR 17X26 10 PK STRL BLUE (TOWEL DISPOSABLE) ×2 IMPLANT
TUBE CONNECTING 12X1/4 (SUCTIONS) ×2 IMPLANT
WATER STERILE IRR 1000ML POUR (IV SOLUTION) ×2 IMPLANT

## 2012-07-23 NOTE — ED Notes (Signed)
PT made aware of plan of care. Charge RN made aware we are holding pt until arrival of orthopedic surgery. PT resting comfortably in bed. VSS. Denies pain.

## 2012-07-23 NOTE — Progress Notes (Signed)
Orthopedic Tech Progress Note Patient Details:  Yvonne Reynolds [birth year] [id number]  Ortho Devices Type of Ortho Device: Ace wrap;Post (short leg) splint;Stirrup splint Ortho Device/Splint Location: left leg Ortho Device/Splint Interventions: Application   [name] 07/23/2012, 7:22 PM

## 2012-07-23 NOTE — ED Notes (Signed)
Ankle relocated by PA Josh. Orth at bedside casting ankle. Pt able to respond verbally. GCS 15. VSS.

## 2012-07-23 NOTE — ED Notes (Signed)
Clarification on medication: 1341:10 mg Etomidate given. 1349: 40 mg propofol given. 1352 20 [id number] given. At 1354: 40 mg propofol given. All medications ordered by MD Rubin Payor and given by Deniece Portela, RN

## 2012-07-23 NOTE — Anesthesia Postprocedure Evaluation (Signed)
  Anesthesia Post-op Note  Patient: Yvonne Reynolds  Procedure(s) Performed: Procedure(s): OPEN REDUCTION INTERNAL FIXATION (ORIF) ANKLE FRACTURE (Left)  Patient Location: PACU  Anesthesia Type:GA combined with regional for post-op pain  Level of Consciousness: awake, alert , oriented and patient cooperative  Airway and Oxygen Therapy: Patient Spontanous Breathing and Patient connected to nasal cannula oxygen  Post-op Pain: mild  Post-op Assessment: Post-op Vital signs reviewed, Patient's Cardiovascular Status Stable, Respiratory Function Stable, Patent Airway, No signs of Nausea or vomiting and Pain level controlled  Post-op Vital Signs: Reviewed and stable  Complications: No apparent anesthesia complications

## 2012-07-23 NOTE — Anesthesia Preprocedure Evaluation (Signed)
Anesthesia Evaluation  Patient identified by MRN, date of birth, ID band Patient awake    Reviewed: Allergy & Precautions, H&P , NPO status , Patient's Chart, lab work & pertinent test results  History of Anesthesia Complications Negative for: history of anesthetic complications  Airway Mallampati: II TM Distance: >3 FB Neck ROM: Full    Dental  (+) Edentulous Upper, Partial Lower, Dental Advisory Given and Poor Dentition   Pulmonary sleep apnea , COPDRecent URI: in chart, but patient says has never been tested, does not use CPAP., former smoker (quit 1 year),  breath sounds clear to auscultation  Pulmonary exam normal       Cardiovascular hypertension, Pt. on medications Rhythm:Regular Rate:Normal     Neuro/Psych    GI/Hepatic Neg liver ROS, GERD-  Medicated and Poorly Controlled,Last ate 9:30 am   Endo/Other  diabetes (glu 186), Well Controlled, Type 2, Insulin Dependent and Oral Hypoglycemic AgentsMorbid obesity  Renal/GU negative Renal ROS     Musculoskeletal   Abdominal (+) + obese,   Peds  Hematology Petechiae: plt 360k   Anesthesia Other Findings   Reproductive/Obstetrics                           Anesthesia Physical Anesthesia Plan  ASA: III  Anesthesia Plan: General   Post-op Pain Management:    Induction: Intravenous and Rapid sequence  Airway Management Planned: Oral ETT  Additional Equipment:   Intra-op Plan:   Post-operative Plan: Extubation in OR  Informed Consent: I have reviewed the patients History and Physical, chart, labs and discussed the procedure including the risks, benefits and alternatives for the proposed anesthesia with the patient or authorized representative who has indicated his/her understanding and acceptance.   Dental advisory given  Plan Discussed with: CRNA and Surgeon  Anesthesia Plan Comments: (Plan routine monitors, GETA with popliteal  block for post op analgesia)        Anesthesia Quick Evaluation

## 2012-07-23 NOTE — Preoperative (Signed)
Beta Blockers   Reason not to administer Beta Blockers:Not Applicable 

## 2012-07-23 NOTE — ED Notes (Signed)
Pt naked. Consent for surgery sent with pt. Pt belongings with pt. Pt advised of valuables policy. Pt AO x 4. VSS. Reports 3/10 left ankle pain. Md Dierdre Searles made aware that pt is being transported to OR holding area.

## 2012-07-23 NOTE — ED Notes (Signed)
Orthopedics at bedside. Pt signed third consent form for third attempt at reduction. VSS.

## 2012-07-23 NOTE — ED Notes (Signed)
Consent signed. Orthopedic surgery at bedside discussing procedure.

## 2012-07-23 NOTE — ED Notes (Signed)
Pt reports continued nausea. PA made aware.

## 2012-07-23 NOTE — ED Provider Notes (Signed)
History    CSN: [id number] Arrival date & time 07/23/12  1058  First MD Initiated Contact with Patient 07/23/12 1059     Chief Complaint  Patient presents with  . Ankle Pain   (Consider location/radiation/quality/duration/timing/severity/associated sxs/prior Treatment) HPI Comments: Patient presents with complaint of left ankle pain that began acutely when she slipped getting out of the shower this morning. Injury occurred just prior to arrival. She has obvious deformity to ankle. Patient fell onto left hip and shoulder but did not strike her head. EMS was called. Patient last ate at 9 AM. Onset comes acute. Course is constant. Nothing makes symptoms better.  The history is provided by the patient.   Past Medical History  Diagnosis Date  . 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 DIABETES MELLITUS, TYPE II 12/21/2006  . DIABETIC  RETINOPATHY 12/22/2006  . HYPERLIPIDEMIA 12/22/2006  . DEPRESSION 06/26/2006  . OBSTRUCTIVE SLEEP APNEA 12/22/2006    no CPAP  . HYPERTENSION 06/26/2006  . ALLERGIC RHINITIS 12/22/2006  . CHRONIC OBSTRUCTIVE PULMONARY DISEASE, ACUTE EXACERBATION 08/24/2007  . Reflux esophagitis 12/22/2006  . GERD 06/26/2006    with EE  . BARRETTS ESOPHAGUS 12/22/2006    without dysplasia  . HIATAL HERNIA 12/22/2006  . OSTEOARTHRITIS 06/26/2006  . OSTEOPOROSIS 06/26/2006   Past Surgical History  Procedure Laterality Date  . Abdominal hysterectomy    . Esophagogastroduodenoscopy  05/07/2004  . Cesarean section    . Wrist surgery      s/p left  after fall  . Incontinence surgery      s/p   Family History  Problem Relation Age of Onset  . Cancer Mother     Ovarian Cancer  . Stroke Father   . Diabetes Sister   . Stroke Sister   . Diabetes Brother   . Diabetes Sister   . Hypertension Other   . Hyperlipidemia Other    History  Substance Use Topics  . Smoking status: Former Smoker -- 1.00  packs/day    Types: Cigarettes    Quit date: 11/11/2011  . Smokeless tobacco: Not on file  . Alcohol Use: No   OB History   Grav Para Term Preterm Abortions TAB SAB Ect Mult Living                 Review of Systems  Constitutional: Negative for fever and activity change.  HENT: Negative for sore throat, rhinorrhea and neck pain.   Eyes: Negative for redness.  Respiratory: Negative for cough.   Cardiovascular: Negative for chest pain.  Gastrointestinal: Negative for nausea, vomiting, abdominal pain and diarrhea.  Genitourinary: Negative for dysuria.  Musculoskeletal: Positive for joint swelling and arthralgias. Negative for myalgias and back pain.  Skin: Negative for rash and wound.  Neurological: Negative for weakness, numbness and headaches.    Allergies  Procaine hcl  Home Medications   Current Outpatient Rx  Name  Route  Sig  Dispense  Refill  . calcium carbonate (OS-CAL) 600 MG TABS   Oral   Take 600 mg by mouth 2 (two) times daily with a meal.         . citalopram (CELEXA) 20 MG tablet   Oral   Take 1 tablet (20 mg total) by mouth 2 (two) times daily.   180 tablet   3   . glucose blood (ONE TOUCH ULTRA TEST) test strip      Two times a day dx 250.01          .  insulin NPH (HUMULIN N,NOVOLIN N) 100 UNIT/ML injection   Subcutaneous   Inject 120 Units into the skin daily before breakfast.   11 vial   1     90 day supply   . Lactulose 20 GM/30ML SOLN   Oral   Take 30 mLs (20 g total) by mouth 3 (three) times daily.   960 mL   5   . lisinopril (PRINIVIL,ZESTRIL) 20 MG tablet   Oral   Take 1 tablet (20 mg total) by mouth daily.   90 tablet   3   . omeprazole-sodium bicarbonate (ZEGERID) 40-1100 MG per capsule   Oral   Take 1 capsule by mouth 2 (two) times daily. For heartburn   180 capsule   3   . RELION INSULIN SYR 0.5CC/29G 29G X 1/2" 0.5 ML MISC      USE AS DIRECTED   100 each   3   . simvastatin (ZOCOR) 80 MG tablet   Oral   Take  0.5 tablets (40 mg total) by mouth at bedtime.   45 tablet   3   . vitamin E (VITAMIN E) 400 UNIT capsule   Oral   Take 400 Units by mouth daily.          BP 148/59  Pulse 94  Temp(Src) 99 F (37.2 C) (Oral)  Resp 20  SpO2 94%  Physical Exam  Nursing note and vitals reviewed. Constitutional: She appears well-developed and well-nourished.  HENT:  Head: Normocephalic and atraumatic.  Eyes: Conjunctivae are normal. Pupils are equal, round, and reactive to light. Right eye exhibits no discharge. Left eye exhibits no discharge.  Neck: Normal range of motion. Neck supple.  Cardiovascular: Normal rate, regular rhythm and normal heart sounds.  Exam reveals no decreased pulses.   Pulmonary/Chest: Effort normal and breath sounds normal.  Abdominal: Soft. There is no tenderness.  Musculoskeletal: She exhibits tenderness. She exhibits no edema.       Left shoulder: She exhibits tenderness. She exhibits normal range of motion and no bony tenderness.       Left elbow: Normal.       Left wrist: Normal.       Left hip: She exhibits tenderness.       Left knee: Normal.       Left ankle: She exhibits deformity. Tenderness. No proximal fibula tenderness found.       Cervical back: Normal.       Lumbar back: Normal.       Left upper leg: Normal.       Left lower leg: Normal.       Left foot: Normal. She exhibits no swelling and normal capillary refill.  Neurological: She is alert. No sensory deficit.  Motor, sensation, and vascular distal to the injury is fully intact.   Skin: Skin is warm and dry.  Psychiatric: She has a normal mood and affect.    ED Course  Reduction of fracture Date/Time: 07/23/2012 3:00 PM Performed by: Renne Crigler Authorized by: Renne Crigler Consent: Verbal consent obtained. written consent obtained. Risks and benefits: risks, benefits and alternatives were discussed Consent given by: patient Patient understanding: patient states understanding of the  procedure being performed Patient consent: the patient's understanding of the procedure matches consent given Procedure consent: procedure consent matches procedure scheduled Relevant documents: relevant documents present and verified Imaging studies: imaging studies available Patient identity confirmed: verbally with patient, arm band and provided demographic data Time out: Immediately prior to procedure a "time  out" was called to verify the correct patient, procedure, equipment, support staff and site/side marked as required. Patient sedated: yes Patient tolerance: Patient tolerated the procedure well with no immediate complications. Comments: 2+ pedal pulses palpated with cap refill < 2s before and after reduction. Performed under direct supervision of Dr. Rubin Payor who was present at bedside.    (including critical care time) Labs Reviewed  CBC WITH DIFFERENTIAL - Abnormal; Notable for the following:    WBC 17.6 (*)    Hemoglobin 10.9 (*)    HCT 33.4 (*)    Neutrophils Relative % 82 (*)    Neutro Abs 14.4 (*)    All other components within normal limits  BASIC METABOLIC PANEL - Abnormal; Notable for the following:    Glucose, Bld 231 (*)    BUN 27 (*)    GFR calc non Af Amer 56 (*)    GFR calc Af Amer 65 (*)    All other components within normal limits   Dg Ankle Complete Left  07/23/2012   *RADIOLOGY REPORT*  Clinical Data: Ankle pain post fall  LEFT ANKLE COMPLETE - 3+ VIEW  Comparison: None  Findings: Trimalleolar fracture-dislocation left ankle. Oblique lateral malleolar fracture displaced laterally. Transverse medial malleolar fracture displaced laterally. Displaced posterior tibial fracture, intra-articular. Lateral and posterior dislocation of talus at ankle joint. Minimal widening of talonavicular joint. Large plantar calcaneal spur. Associated soft tissue swelling deformity. No additional fracture or dislocation identified.  IMPRESSION: Displaced trimalleolar  fracture-dislocation left ankle.   Original Report Authenticated By: Ulyses Southward, M.D.   1. Trimalleolar fracture, left, closed, initial encounter     11:06 AM Patient seen and examined. 2+ pedal pulses. Patient does not want pain medications at this time.    Vital signs reviewed and are as follows: Filed Vitals:   07/23/12 1102  BP: 148/59  Pulse: 94  Temp: 99 F (37.2 C)  Resp: 20    Date: 07/23/2012  Rate: 93  Rhythm: normal sinus rhythm  QRS Axis: normal  Intervals: normal  ST/T Wave abnormalities: normal  Conduction Disutrbances:none  Narrative Interpretation:   Old EKG Reviewed: unchanged from 06/2012   Conscious sedation performed by Dr. Rubin Payor.   First reduction was unsuccessful.   Pt agrees to second attempt.   Reduction performed under conscious sedation by Dr. Rubin Payor.   Second reduction was unsuccessful.   Spoke with Dr. Rayburn Ma who will see in ED. He is currently in OR.   Handoff to [facility] and Dr. Anitra Lauth. Foot remains neurovascularly intact. 2+ pedal pulses with normal capillary refill.   MDM  Persistent dislocation of trimalleolar ankle fracture.     Renne Crigler, PA-C 07/23/12 1707

## 2012-07-23 NOTE — ED Notes (Signed)
Pt made aware that first reduction was not successful. Consent signed for second attempt. Pt VSS.

## 2012-07-23 NOTE — Op Note (Signed)
07/23/2012  9:47 PM  PATIENT:  Yvonne Reynolds  67 y.o. female  MRN: [id number]  OPERATIVE REPORT  PRE-OPERATIVE DIAGNOSIS:  LT ANKLE FX  POST-OPERATIVE DIAGNOSIS:  left ankle trimallelor fracture, closed and displaced.  PROCEDURE:  Procedure(s): OPEN REDUCTION INTERNAL FIXATION (ORIF) ANKLE FRACTURE, with fixation of the medial and lateral malleolus    SURGEON:  Kerrin Champagne, MD   ASSISTANT: Doneen Poisson, MD    ANESTHESIA:  General,regional block with marcaine popliteal, Dr. Jean Rosenthal.    COMPLICATIONS:  None.     COMPONENTS: 2x 4.46mm cannulated partially threaded screws medial malleolus,  1x 3.25mm lag screw and 6 hole 1/3 semitubular plate to the lateral malleolus.  PROCEDURE:  The patient was met in the holding area, and the appropriate left foot and short leg splint identified and marked with an "X" and my initials.The patient was then transported to OR and was placed on the operative table in a supine position. The patient was then placed under general anesthesia without difficulty. The patient received appropriate preoperative antibiotic prophylaxis Ancef 2 grams.  Tourniquet was applied to the operative thigh. Leg was then prepped using sterile conditions and draped using sterile technique. A bump placed under the right buttock for internal rotation. Time-out procedure was called and correct  The was elevated and Esmarch exsanguinated with a thigh  tourniquet elevated ot 280 mmHg. incision was then made after timeout over the lateral aspect of the leftt fibula approximately 10 cm in length based at the expected fibular fracture site approximately 1 cm proximal to the plafond. Incision was undermined proximally using a periosteal elevator in the subperiosteal plane also distally on the superficial portion of the fibula and posterior aspect. Fracture site identified and carefully opened the periosteal attachments to the lateral malleolus were dissected and the lateral  malleolus distal fracture fragment rotated distally the posterior Malleolus fracture was able to be evaluated and found to represent less than 15 percent of the distal tibial joint surface. The posterior malleolar fracture fragments appeared comminuted and unable to be internally fixed. The joint was irrigated with copious irrigant solution. The fracture of the lateral malleolus was then carefully aligned and held in place with reducing tenaculum. Longitudinal traction was necessary to reduce the fracture. Fracture held in place then a 3.5 lag screw was placed from anterior proximal to distal posterior drilling the superficial cortex over the anterior small fragment obliquely the 3.5 drill bit then placing top hat sleeve and drilling the deep cortex with a 2.5 drill and placing the appropriate length screafter measuring for depth.w this provided adequate initial fixation. A 6-hole 3.5 one third semitubular plate was carefully aligned along the lateral posterior aspect of the crest of the fibula 3 holes on either side of the fracture line. Drill holes were then placed proximal to the fracture line springing of the fibula medially. These screw holes were made first drilling with a 2.5 drill entering depth and placing the appropriate screw. Then from the most proximal to distal screw the fracture was compressed by placing drill holes in eccentric within the plate opening and extending distal and then placing the appropriate cortical screws after measuring for depth biting some DCP action with the first 2 screws just distal to the fracture line. A last two screws were 4.0 fully threaded cancellus screw placed in neutral position. Mini C-arm was draped sterilely and brought into the field and the fixation felt to be near anatomic with some fragmentation of the posterior medial aspect  of the fracture site. Distal tib-fib syndesmosis appeared reduced. The medial malleolar fracture fragment overall a large portion of the  medial malleolus felt to have some obliquity that potentially would displace with percutaneous fixation. Therefore a curved incision was made along the medial malleolus a flap based proximal and posterior to the skin and subcutaneous layers preserving the saphenous vein and nerve in the anterior aspect of the incision down to the periosteum of the medial aspect of the medial malleolus periosteum was incised and the fracture identified reduced and held in place with a reducing tenaculum. A 2.0 mm pin placed posteriorly observed on C-arm fluoroscopy to be in good position alignment measuring about 50 mm this pin was then removed and overdrilled with a 2.5 drill bit superficial cortex tapped with a 4.0 tap and a partially-threaded 4.0 cancellus 50mm  malleolar screw placed. This obtained excellent fixation and purchase. The reducing tenaculum was then removed and a 2.5 drill bit used to drill over the anterior aspect of the medial malleolar distal fragment in line with previous malleolar screw. A second 40 mm 4.0 partially-threaded cancellous screw chosen tapping the superficial cortex with a 4.0 tap the second 40 mm screw was again obtaining excellent purchase and the fracture line was reduced anatomically. Mini C-arm then used to observe the fixation of the right ankle joint ankle mortise was well reduced with medial malleolus in excellent position alignment. The fibular fracture with some comminution of the fracture site however position alignment appeared to be quite stable. Distal tib-fib syndesmosis appeared reduced. Irrigation was then carried down to the medial incision site area of skin carefully approximated subcutaneous layers approximated with interrupted 2-0 Vicryl sutures and skin closed with stainless steel staples. Lateral incision site was then irrigated with copious amounts of irrigant solution the deep subcutaneous layers were then approximated with interrupted 0 Vicryl sutures more superficial  layers with interrupted 2-0 Vicryl sutures and the skin closed with stainless steel staples. Xeroform gauze 4 x 4s ABDs pad then fixed to the incision sites with sterile Webril. And a well-padded short leg posterior splint applied with posterior strap and medial and lateral strap. Tourniquet was released total tourniquet time was 60 minutes at 280 mm Hg. All instrument and sponge counts were correct patient was then reactivated extubated returned to her bed and to the recovery room in satisfactory condition.   NITKA,JAMES E 07/23/2012, 9:47 PM    .

## 2012-07-23 NOTE — ED Notes (Signed)
Pt awake and actively vomiting. Orders to follow. 4mg  Zofran given.

## 2012-07-23 NOTE — H&P (Addendum)
Yvonne Reynolds is an 67 y.o. female.   Chief Complaint: Left ankle pain. HPI: 67 year old female fell this 1030AM at her home when getting out of the bathtub and slipped twisting her Left ankle with immediate pain and discomfort and inability to walk, managed to crawl to the bedroom and Call for EMS. Xrays show a left posterolateral ankle fracture dislocation. She has  Undergone 3 attempts at Reduction of the fracture dislocation without adequate reduction.  She is being admitted to go to the OR for  ORIF of the left trimalleolar ankle fracture dislocation with plates   Past Medical History  Diagnosis Date  . 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 DIABETES MELLITUS, TYPE II 12/21/2006  . DIABETIC  RETINOPATHY 12/22/2006  . HYPERLIPIDEMIA 12/22/2006  . DEPRESSION 06/26/2006  . OBSTRUCTIVE SLEEP APNEA 12/22/2006    no CPAP  . HYPERTENSION 06/26/2006  . ALLERGIC RHINITIS 12/22/2006  . CHRONIC OBSTRUCTIVE PULMONARY DISEASE, ACUTE EXACERBATION 08/24/2007  . Reflux esophagitis 12/22/2006  . GERD 06/26/2006    with EE  . BARRETTS ESOPHAGUS 12/22/2006    without dysplasia  . HIATAL HERNIA 12/22/2006  . OSTEOARTHRITIS 06/26/2006  . OSTEOPOROSIS 06/26/2006    Past Surgical History  Procedure Laterality Date  . Abdominal hysterectomy    . Esophagogastroduodenoscopy  05/07/2004  . Cesarean section    . Wrist surgery      s/p left  after fall  . Incontinence surgery      s/p    Family History  Problem Relation Age of Onset  . Cancer Mother     Ovarian Cancer  . Stroke Father   . Diabetes Sister   . Stroke Sister   . Diabetes Brother   . Diabetes Sister   . Hypertension Other   . Hyperlipidemia Other    Social History:  reports that she quit smoking about 8 months ago. Her smoking use included Cigarettes. She smoked 1.00 pack per day. She does not have any smokeless tobacco history on file. She reports that she does  not drink alcohol or use illicit drugs.  Allergies:  Allergies  Allergen Reactions  . Procaine Hcl     REACTION: hallucinations     (Not in a hospital admission)  Results for orders placed during the hospital encounter of 07/23/12 (from the past 48 hour(s))  CBC WITH DIFFERENTIAL     Status: Abnormal   Collection Time    07/23/12 11:54 AM      Result Value Range   WBC 17.6 (*) 4.0 - 10.5 K/uL   RBC 4.16  3.87 - 5.11 MIL/uL   Hemoglobin 10.9 (*) 12.0 - 15.0 g/dL   HCT 16.1 (*) 09.6 - 04.5 %   MCV 80.3  78.0 - 100.0 fL   MCH 26.2  26.0 - 34.0 pg   MCHC 32.6  30.0 - 36.0 g/dL   RDW 40.9  81.1 - 91.4 %   Platelets 360  150 - 400 K/uL   Neutrophils Relative % 82 (*) 43 - 77 %   Neutro Abs 14.4 (*) 1.7 - 7.7 K/uL   Lymphocytes Relative 12  12 - 46 %   Lymphs Abs 2.0  0.7 - 4.0 K/uL   Monocytes Relative 6  3 - 12 %   Monocytes Absolute 1.0  0.1 - 1.0 K/uL   Eosinophils Relative 1  0 - 5 %   Eosinophils Absolute 0.2  0.0 - 0.7  K/uL   Basophils Relative 0  0 - 1 %   Basophils Absolute 0.0  0.0 - 0.1 K/uL  BASIC METABOLIC PANEL     Status: Abnormal   Collection Time    07/23/12 11:54 AM      Result Value Range   Sodium 136  135 - 145 mEq/L   Potassium 4.7  3.5 - 5.1 mEq/L   Chloride 101  96 - 112 mEq/L   CO2 24  19 - 32 mEq/L   Glucose, Bld 231 (*) 70 - 99 mg/dL   BUN 27 (*) 6 - 23 mg/dL   Creatinine, Ser 0.45  0.50 - 1.10 mg/dL   Calcium 8.8  8.4 - 40.9 mg/dL   GFR calc non Af Amer 56 (*) >90 mL/min   GFR calc Af Amer 65 (*) >90 mL/min   Comment:            The eGFR has been calculated     using the CKD EPI equation.     This calculation has not been     validated in all clinical     situations.     eGFR's persistently     <90 mL/min signify     possible Chronic Kidney Disease.  GLUCOSE, CAPILLARY     Status: Abnormal   Collection Time    07/23/12  2:39 PM      Result Value Range   Glucose-Capillary 186 (*) 70 - 99 mg/dL   Dg Chest 2 View  09/06/9145    *RADIOLOGY REPORT*  Clinical Data: Ankle pain post fall, trimalleolar fracture- dislocation, preoperative evaluation, history diabetes, hypertension, hyperlipidemia, obstructive sleep apnea, COPD  CHEST - 2 VIEW  Comparison: 04/14/2011  Findings: Upper normal heart size. Mediastinal contours and pulmonary vascularity normal. COPD changes with scattered chronic interstitial prominence in both lungs little changed when accounting for differences in technique. No definite acute infiltrate, pleural effusion or pneumothorax. Bones demineralized.  IMPRESSION: COPD changes with mild stable chronic interstitial prominence. No acute abnormalities.   Original Report Authenticated By: Ulyses Southward, M.D.   Dg Ankle Complete Left  07/23/2012   *RADIOLOGY REPORT*  Clinical Data: Post reduction.  LEFT ANKLE COMPLETE - 3+ VIEW  Comparison: 07/23/2012 at 1138 hours.  Findings: Fiberglass splint is in place.  Persistent dorsal dislocation of the talus with respect to the distal tibia. Trimalleolar fractures are again noted.  Talar dome appears grossly intact.  Prominent calcaneal spurs.  IMPRESSION: Interval fiberglass splinting of a trimalleolar ankle fracture dislocation, not significantly changed in appearance.   Original Report Authenticated By: Leanna Battles, M.D.   Dg Ankle Complete Left  07/23/2012   *RADIOLOGY REPORT*  Clinical Data: Ankle pain post fall  LEFT ANKLE COMPLETE - 3+ VIEW  Comparison: None  Findings: Trimalleolar fracture-dislocation left ankle. Oblique lateral malleolar fracture displaced laterally. Transverse medial malleolar fracture displaced laterally. Displaced posterior tibial fracture, intra-articular. Lateral and posterior dislocation of talus at ankle joint. Minimal widening of talonavicular joint. Large plantar calcaneal spur. Associated soft tissue swelling deformity. No additional fracture or dislocation identified.  IMPRESSION: Displaced trimalleolar fracture-dislocation left ankle.   Original  Report Authenticated By: Ulyses Southward, M.D.   Dg Ankle Left Port  07/23/2012   *RADIOLOGY REPORT*  Clinical Data: Post reduction attempt  PORTABLE LEFT ANKLE - 2 VIEW  Comparison: Earlier same day (multiple examinations)  Findings:  Evaluation of fine bony detail is degraded secondary to overlying casting material.  Grossly unchanged appearance of trimalleolar fracture dislocation, specifically, there  is persistent anterior dislocation of the distal tibia in relation to the talar dome.  No radiopaque foreign body.  Plantar calcaneal spur.  IMPRESSION: No significant change in alignment of previously identified displaced trimalleolar fracture / dislocation.   Original Report Authenticated By: Tacey Ruiz, MD    Review of Systems  Constitutional: Positive for chills. Negative for fever, weight loss, malaise/fatigue and diaphoresis.  HENT: Negative for hearing loss, ear pain, nosebleeds, congestion, sore throat, neck pain, tinnitus and ear discharge.   Eyes: Negative for blurred vision, double vision, photophobia, pain, discharge and redness.  Respiratory: Positive for cough and shortness of breath. Negative for hemoptysis, wheezing and stridor.   Cardiovascular: Positive for leg swelling. Negative for chest pain, palpitations, orthopnea, claudication and PND.  Gastrointestinal: Positive for heartburn and vomiting. Negative for nausea, abdominal pain, diarrhea, constipation, blood in stool and melena.  Genitourinary: Negative for dysuria, urgency, frequency and hematuria.  Musculoskeletal: Positive for falls. Negative for myalgias, back pain and joint pain.  Skin: Negative for itching and rash.  Neurological: Negative for dizziness, tingling, tremors, sensory change, speech change, focal weakness, seizures, loss of consciousness, weakness and headaches.  Endo/Heme/Allergies: Negative for environmental allergies and polydipsia. Does not bruise/bleed easily.  Psychiatric/Behavioral: Negative for  depression, suicidal ideas, hallucinations, memory loss and substance abuse. The patient is not nervous/anxious and does not have insomnia.     Blood pressure 132/72, pulse 88, temperature 99 F (37.2 C), temperature source Oral, resp. rate 18, weight 79 kg (174 lb 2.6 oz), SpO2 98.00%. Physical Exam  Constitutional: She is oriented to person, place, and time. She appears well-developed and well-nourished. No distress.  HENT:  Head: Normocephalic and atraumatic.  Right Ear: External ear normal.  Left Ear: External ear normal.  Nose: Nose normal.  Mouth/Throat: Oropharynx is clear and moist.  Eyes: Conjunctivae and EOM are normal. Pupils are equal, round, and reactive to light. Right eye exhibits no discharge. Left eye exhibits no discharge. Scleral icterus is present.  Neck: Normal range of motion. Neck supple. No JVD present. No tracheal deviation present. No thyromegaly present.  Cardiovascular: Normal rate, regular rhythm, normal heart sounds and intact distal pulses.  Exam reveals no gallop and no friction rub.   No murmur heard. Respiratory: No stridor. No respiratory distress. She has no wheezes. She has no rales. She exhibits no tenderness.  GI: She exhibits no distension and no mass. There is no tenderness. There is no rebound and no guarding.  Musculoskeletal: She exhibits edema and tenderness.  Lymphadenopathy:    She has no cervical adenopathy.  Neurological: She is alert and oriented to person, place, and time. She has normal reflexes. She displays normal reflexes. No cranial nerve deficit. She exhibits normal muscle tone.  Skin: Skin is warm and dry. No rash noted. She is not diaphoretic. There is erythema. No pallor.  Psychiatric: She has a normal mood and affect. Her behavior is normal. Judgment and thought content normal.  Orthopedic examination: Patient is a 67 year old female alert oriented x4 she underwent attempt at closed reduction of left trimalleolar ankle fracture  dislocation unsuccessfully, 2 times by the emergency room physician in one time to myself. Her exam shows left foot warm sensate swollen bilaterally at the left ankle. Dorsalis pedis pulse 2+ posterior tibialis pulse 2+. Gross motion of the toes is intact. Capillary refill is normal. Plain radiographs demonstrated a left trimalleolar fracture dislocation posterior lateral with the fracture the medial malleolus at the level of the plafond the lateral  malleolus a oblique spiral fracture also the level of the plafond and a large posterior lateral posterior malleolus fracture that involves about 30% of the joint. Fracture is irreducible in the emergency room remains closed with no skin changes.  Assessment/Plan Left trimalleolar Ankle fracture posterolateral dislocation. Irreducible closed. HTN Dyslipidemia Depression. Diabetes mellitus, insulin dependent. GERD  Plan:OR for ORIF of the left ankle fracture dislocation with plate and screws. Patient was seen and examined in the preop holding area. There has been no interval  Change in this patient's exam preop  history and physical exam  Lab tests and images have been examined and reviewed.  The Risks benefits and alternative treatments have been discussed  extensively,questions answered.  The patient has elected to undergo the discussed surgical treatment. Dajae Kizer E 07/23/2012, 5:58 PM

## 2012-07-23 NOTE — ED Notes (Signed)
Per EMS- pt had a fall when getting out of the shower. Pt has deformity to left ankle. Pt reports left shoulder pain as well. Pulses present pt able to move foot.

## 2012-07-23 NOTE — ED Notes (Signed)
Brother Clinical biochemist requesting updates: [phone]

## 2012-07-23 NOTE — Brief Op Note (Signed)
07/23/2012  9:43 PM  PATIENT:  Yvonne Reynolds  67 y.o. female  PRE-OPERATIVE DIAGNOSIS:  LT ANKLE FX  POST-OPERATIVE DIAGNOSIS:  left ankle trimallelor fracture  PROCEDURE:  Procedure(s): OPEN REDUCTION INTERNAL FIXATION (ORIF) ANKLE FRACTURE (Left) Medial and lateral malleolus fixation.  SURGEON:  Surgeon(s) and Role:    Kerrin Champagne, MD - Primary    Kathryne Hitch, MD - Assisting   ANESTHESIA:  regional left popliteal block with marcaine, general, Dr. Jean Rosenthal.   EBL:  Total I/O In: 1500 [I.V.:1500] Out: - >30 cc  BLOOD ADMINISTERED:none  DRAINS: none   LOCAL MEDICATIONS USED:  NONE  SPECIMEN:  No Specimen  DISPOSITION OF SPECIMEN:  N/A  COUNTS:  YES  TOURNIQUET:   Total Tourniquet Time Documented: Thigh (Left) - 87 minutes Total: Thigh (Left) - 87 minutes   DICTATION: .Reubin Milan Dictation  PLAN OF CARE: Admit to inpatient   PATIENT DISPOSITION:  PACU - hemodynamically stable.   Delay start of Pharmacological VTE agent (>24hrs) due to surgical blood loss or risk of bleeding: no

## 2012-07-23 NOTE — ED Notes (Signed)
Paged ortho 

## 2012-07-23 NOTE — ED Notes (Signed)
Contacted radiology about wait time. Pt made aware about delay.

## 2012-07-23 NOTE — Anesthesia Procedure Notes (Addendum)
Anesthesia Regional Block:  Popliteal block  Pre-Anesthetic Checklist: ,, timeout performed, Correct Patient, Correct Site, Correct Laterality, Correct Procedure, Correct Position, site marked, Risks and benefits discussed,  Surgical consent,  Pre-op evaluation,  At surgeon's request and post-op pain management  Laterality: Left  Prep: chloraprep       Needles:  Injection technique: Single-shot  Needle Type: Stimulator Needle - 80      Needle Gauge: 22 and 22 G    Additional Needles:  Procedures: nerve stimulator Popliteal block  Nerve Stimulator or Paresthesia:  Response: toe dorsiflexion, 0.45 mA, 0.1 ms,  Response: toe abduction, 0.45 mA, 0.1 ms,   Additional Responses:   Narrative:  Start time: 07/23/2012 7:12 PM End time: 07/23/2012 7:17 PM Injection made incrementally with aspirations every 5 mL.  Performed by: Personally  Anesthesiologist: Sandford Craze, MD  Additional Notes: Pt identified in Holding room.  Monitors applied. Working IV access confirmed. Sterile prep L lateral knee.  #22ga PNS to toe dorsiflexion and toe abduction twitches at 0.15mA threshold.  30cc 0.5% Bupivacaine with 1:200k epi injected incrementally after negative test dose.  Prep prox ant-medial tibia and 10cc 0.5% Bupivacaine with 1:200k epi injected subq for supplementation to saphenous nerve. Patient asymptomatic, VSS, no heme aspirated, tolerated well.  Sandford Craze, MD  Popliteal block Procedure Name: Intubation Date/Time: 07/23/2012 8:06 PM Performed by: Brien Mates DOBSON Pre-anesthesia Checklist: Patient identified, Emergency Drugs available, Suction available, Patient being monitored and Timeout performed Patient Re-evaluated:Patient Re-evaluated prior to inductionOxygen Delivery Method: Circle system utilized Preoxygenation: Pre-oxygenation with 100% oxygen Intubation Type: IV induction, Cricoid Pressure applied and Rapid sequence Laryngoscope Size: Miller and 2 Grade View: Grade  I Tube type: Oral Tube size: 7.5 mm Number of attempts: 1 Airway Equipment and Method: Stylet Placement Confirmation: ETT inserted through vocal cords under direct vision,  positive ETCO2 and breath sounds checked- equal and bilateral Secured at: 21 cm Tube secured with: Tape Dental Injury: Teeth and Oropharynx as per pre-operative assessment

## 2012-07-23 NOTE — Transfer of Care (Signed)
Immediate Anesthesia Transfer of Care Note  Patient: Yvonne Reynolds  Procedure(s) Performed: Procedure(s): OPEN REDUCTION INTERNAL FIXATION (ORIF) ANKLE FRACTURE (Left)  Patient Location: PACU  Anesthesia Type:General  Level of Consciousness: awake, alert  and oriented  Airway & Oxygen Therapy: Patient Spontanous Breathing and Patient connected to nasal cannula oxygen  Post-op Assessment: Report given to PACU RN, Post -op Vital signs reviewed and stable and Patient moving all extremities X 4  Post vital signs: Reviewed and stable  Complications: No apparent anesthesia complications

## 2012-07-23 NOTE — ED Notes (Signed)
Orthopedic at bedside

## 2012-07-24 ENCOUNTER — Inpatient Hospital Stay (HOSPITAL_COMMUNITY): Payer: Medicare Other

## 2012-07-24 DIAGNOSIS — E1139 Type 2 diabetes mellitus with other diabetic ophthalmic complication: Secondary | ICD-10-CM

## 2012-07-24 LAB — HEMOGLOBIN A1C
Hgb A1c MFr Bld: 8.5 % — ABNORMAL HIGH (ref <5.7)
Mean Plasma Glucose: 197 mg/dL — ABNORMAL HIGH (ref <117)

## 2012-07-24 LAB — GLUCOSE, CAPILLARY
Glucose-Capillary: 288 mg/dL — ABNORMAL HIGH (ref 70–99)
Glucose-Capillary: 249 mg/dL — ABNORMAL HIGH (ref 70–99)
Glucose-Capillary: 179 mg/dL — ABNORMAL HIGH (ref 70–99)

## 2012-07-24 MED ORDER — INSULIN ASPART 100 UNIT/ML ~~LOC~~ SOLN
10.0000 [IU] | Freq: Three times a day (TID) | SUBCUTANEOUS | Status: DC
  Administered 2012-07-24 – 2012-07-25 (×2): 10 [IU] via SUBCUTANEOUS

## 2012-07-24 MED ORDER — INSULIN GLARGINE 100 UNIT/ML ~~LOC~~ SOLN
60.0000 [IU] | Freq: Every day | SUBCUTANEOUS | Status: DC
  Administered 2012-07-25: 30 [IU] via SUBCUTANEOUS
  Administered 2012-07-26: 60 [IU] via SUBCUTANEOUS
  Administered 2012-07-27: 30 [IU] via SUBCUTANEOUS
  Filled 2012-07-24 (×5): qty 0.6

## 2012-07-24 NOTE — Evaluation (Signed)
Physical Therapy Evaluation Patient Details Name: Yvonne Reynolds MRN: [id number] DOB: [birth year] Today's Date: 07/24/2012 Time: 2376-2831 PT Time Calculation (min): 14 min  PT Assessment / Plan / Recommendation History of Present Illness  67 y.o. female admitted to [facility] s/p fall in bathroom with left ankle dislocation/fracture. She is s/p ORIF L ankle and is NWB L leg post-op.    Clinical Impression  Pt is progressing well with therapy.  Her brother confirms she can come and stay with him.  She is able to maintain WB status and hop on one leg short distances.      PT Assessment  Patient needs continued PT services    Follow Up Recommendations  Home health PT;Supervision/Assistance - 24 hour    Does the patient have the potential to tolerate intense rehabilitation     NA  Barriers to Discharge   None None    Equipment Recommendations  Rolling walker with 5" wheels;3in1 (PT);Wheelchair (measurements PT);Wheelchair cushion (measurements PT);Other (comment) (18x18 WC with elevating leg rests and basic cushion.  )    Recommendations for Other Services   none  Frequency Min 5X/week    Precautions / Restrictions Precautions Precautions: Fall Precaution Comments: per pt report her right leg is not very strong.   Restrictions Weight Bearing Restrictions: Yes LLE Weight Bearing: Non weight bearing   Pertinent Vitals/Pain See vitals flow sheet.      Mobility  Bed Mobility Bed Mobility: Supine to Sit;Sitting - Scoot to Edge of Bed Supine to Sit: 4: Min guard;With rails;HOB elevated Sitting - Scoot to Edge of Bed: 4: Min guard;With rail Details for Bed Mobility Assistance: min guard to assist pt with trunk to get to sitting.   Transfers Transfers: Sit to Stand;Stand to Sit Sit to Stand: 4: Min guard;With upper extremity assist;From bed Stand to Sit: 4: Min guard;With upper extremity assist;With armrests;To chair/3-in-1 Details for Transfer Assistance: min guard assist to steady  pt for balance upon first standing.  Ambulation/Gait Ambulation/Gait Assistance: 4: Min assist Ambulation Distance (Feet): 10 Feet Assistive device: Rolling walker Ambulation/Gait Assistance Details: pt has enough upper extremity strength to take some hop steps with assist with RW.   Gait Pattern: Step-to pattern (hop to, pt is able to maintain NWB entire time she is up)        PT Diagnosis: Difficulty walking;Abnormality of gait;Generalized weakness;Acute pain  PT Problem List: Decreased strength;Decreased activity tolerance;Decreased balance;Decreased mobility;Decreased knowledge of use of DME;Decreased knowledge of precautions;Pain PT Treatment Interventions: DME instruction;Gait training;Functional mobility training;Therapeutic activities;Therapeutic exercise;Balance training;Patient/family education;Wheelchair mobility training;Modalities     PT Goals(Current goals can be found in the care plan section) Acute Rehab PT Goals Patient Stated Goal: to go back to being independent as soon as possible, to avoid SNF placement.  PT Goal Formulation: With patient Time For Goal Achievement: 07/31/12 Potential to Achieve Goals: Good  Visit Information  Last PT Received On: 07/24/12 Assistance Needed: +1 History of Present Illness: 67 y.o. female admitted to [facility] s/p fall in bathroom with left ankle dislocation/fracture. She is s/p ORIF L ankle and is NWB L leg post-op.         Prior Functioning  Home Living Family/patient expects to be discharged to:: Private residence Living Arrangements: Other relatives (brother and sister-in-law) Available Help at Discharge: Family;Available 24 hours/day (brother doesn't work and has volunteered to help) Type of Home: House Home Access: Stairs to enter [employer] of Steps: 1 (brother reports he can make a make-shift ramp) Entrance Stairs-Rails: None  Home Layout: One level Home Equipment: None Prior Function Level of Independence:  Independent Communication Communication: No difficulties    Cognition  Cognition Arousal/Alertness: Awake/alert Behavior During Therapy: WFL for tasks assessed/performed Overall Cognitive Status: Within Functional Limits for tasks assessed    Extremity/Trunk Assessment Upper Extremity Assessment Upper Extremity Assessment: Overall WFL for tasks assessed Lower Extremity Assessment Lower Extremity Assessment: LLE deficits/detail LLE Deficits / Details: left leg in hard splint, but pt able to wiggle toes, lift leg against gravity.        End of Session PT - End of Session Equipment Utilized During Treatment: Gait belt Activity Tolerance: Patient limited by fatigue;Patient limited by pain Patient left: in chair;with call bell/phone within reach       [place] B. Aishani Kalis, PT, DPT [phone]   07/24/2012, 2:34 PM

## 2012-07-24 NOTE — Progress Notes (Signed)
Subjective: 1 Day Post-Op Procedure(s) (LRB): OPEN REDUCTION INTERNAL FIXATION (ORIF) ANKLE FRACTURE (Left) Patient reports pain as mild.    Objective: Vital signs in last 24 hours: Temp:  [97.3 F (36.3 C)-99 F (37.2 C)] 98.3 F (36.8 C) (06/28 0529) Pulse Rate:  [81-128] 90 (06/28 0529) Resp:  [12-24] 18 (06/28 0529) BP: (109-208)/(47-101) 140/62 mmHg (06/28 0529) SpO2:  [91 %-100 %] 100 % (06/28 0529) Weight:  [79 kg (174 lb 2.6 oz)] 79 kg (174 lb 2.6 oz) (06/27 1344)  Intake/Output from previous day: 06/27 0701 - 06/28 0700 In: 2458.3 [P.O.:480; I.V.:1928.3; IV Piggyback:50] Out: 450 [Urine:450] Intake/Output this shift:     Recent Labs  07/23/12 1154  HGB 10.9*    Recent Labs  07/23/12 1154  WBC 17.6*  RBC 4.16  HCT 33.4*  PLT 360    Recent Labs  07/23/12 1154  NA 136  K 4.7  CL 101  CO2 24  BUN 27*  CREATININE 1.02  GLUCOSE 231*  CALCIUM 8.8   No results found for this basename: LABPT, INR,  in the last 72 hours  Sensation intact distally Incision: dressing C/D/I Splint fitting comfortably; moves her toes, which are well-perfused.  Assessment/Plan: 1 Day Post-Op Procedure(s) (LRB): OPEN REDUCTION INTERNAL FIXATION (ORIF) ANKLE FRACTURE (Left) Up with therapy, NWB left ankle  BLACKMAN,CHRISTOPHER Y 07/24/2012, 9:00 AM

## 2012-07-24 NOTE — H&P (Signed)
[facility] Medical Consultation  [name]:[id number] DOB: 1945-06-30 DOA: 07/23/2012 PCP: Romero Belling, MD   Requesting physician: Otelia Sergeant Date of consultation: 07/24/2012 Reason for consultation: Diabetes management  Impression/Recommendations Principal Problem:   Displaced trimalleolar fracture of left lower leg Active Problems:   DIABETES MELLITUS, TYPE II   Diabetes mellitus type 2 -Insulin-dependent diabetes mellitus type 2, patient is on 120 units of NPH. -Last hemoglobin A1c indicates poor glycemic control. -A1c was 10.1 which correlate with mean plasma glucose level of 243. -Agree with diabetic diet and insulin sliding scale. -Per Dr. Everardo All she is on NPH for the simplicity of the regimen. -She has poor glycemic control with NPH will switch to long-acting insulin plus tmeal coverage regimen. -Oh started on Lantus insulin and NovoLog.  We will followup again tomorrow. Please contact me if I can be of assistance in the meanwhile. Thank you for this consultation.  Chief Complaint: Ankle fracture  HPI:  Mrs. Yvonne Reynolds is 67 year old Caucasian female with past medical history of diabetes mellitus type 2, patient came in to the hospital after she fell and she had displaced left ankle fracture. Patient had surgery yesterday, [facility] being consulted for diabetes management. Last note from Dr. Everardo All on 06/29/2012 showed that she is on 120 units of NPH daily. Patient said she is only taking 50 units twice a day because she was getting hypoglycemic episodes Will weight 120 units daily.  Review of Systems:  Constitutional: negative for anorexia, fevers and sweats Eyes: negative for irritation, redness and visual disturbance Ears, nose, mouth, throat, and face: negative for earaches, epistaxis, nasal congestion and sore throat Respiratory: negative for cough, dyspnea on exertion, sputum and wheezing Cardiovascular: negative for chest pain, dyspnea, lower  extremity edema, orthopnea, palpitations and syncope Gastrointestinal: negative for abdominal pain, constipation, diarrhea, melena, nausea and vomiting Genitourinary:negative for dysuria, frequency and hematuria Hematologic/lymphatic: negative for bleeding, easy bruising and lymphadenopathy Musculoskeletal:negative for arthralgias, muscle weakness and stiff joints Neurological: negative for coordination problems, gait problems, headaches and weakness Endocrine: negative for diabetic symptoms including polydipsia, polyuria and weight loss Allergic/Immunologic: negative for anaphylaxis, hay fever and urticaria  Past Medical History  Diagnosis Date  . 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 DIABETES MELLITUS, TYPE II 12/21/2006  . DIABETIC  RETINOPATHY 12/22/2006  . HYPERLIPIDEMIA 12/22/2006  . DEPRESSION 06/26/2006  . OBSTRUCTIVE SLEEP APNEA 12/22/2006    no CPAP  . HYPERTENSION 06/26/2006  . ALLERGIC RHINITIS 12/22/2006  . CHRONIC OBSTRUCTIVE PULMONARY DISEASE, ACUTE EXACERBATION 08/24/2007  . Reflux esophagitis 12/22/2006  . GERD 06/26/2006    with EE  . BARRETTS ESOPHAGUS 12/22/2006    without dysplasia  . HIATAL HERNIA 12/22/2006  . OSTEOARTHRITIS 06/26/2006  . OSTEOPOROSIS 06/26/2006   Past Surgical History  Procedure Laterality Date  . Abdominal hysterectomy    . Esophagogastroduodenoscopy  05/07/2004  . Cesarean section    . Wrist surgery      s/p left  after fall  . Incontinence surgery      s/p   Social History:  reports that she quit smoking about 8 months ago. Her smoking use included Cigarettes. She smoked 1.00 pack per day. She does not have any smokeless tobacco history on file. She reports that she does not drink alcohol or use illicit drugs.  Allergies  Allergen Reactions  . Procaine Hcl     REACTION: hallucinations   Family History  Problem Relation Age of  Onset  . Cancer Mother     Ovarian  Cancer  . Stroke Father   . Diabetes Sister   . Stroke Sister   . Diabetes Brother   . Diabetes Sister   . Hypertension Other   . Hyperlipidemia Other     Prior to Admission medications   Medication Sig Start Date End Date Taking? Authorizing Provider  calcium carbonate (OS-CAL) 600 MG TABS Take 1,200 mg by mouth daily.    Yes Historical Provider, MD  citalopram (CELEXA) 20 MG tablet Take 1 tablet (20 mg total) by mouth 2 (two) times daily. 07/13/12  Yes Romero Belling, MD  glucose blood (ONE TOUCH ULTRA TEST) test strip Two times a day dx 250.01    Yes Historical Provider, MD  insulin NPH (HUMULIN N,NOVOLIN N) 100 UNIT/ML injection Inject 100 Units into the skin daily before breakfast.   Yes Historical Provider, MD  lisinopril (PRINIVIL,ZESTRIL) 20 MG tablet Take 1 tablet (20 mg total) by mouth daily. 03/16/12  Yes Romero Belling, MD  omeprazole-sodium bicarbonate (ZEGERID) 40-1100 MG per capsule Take 1 capsule by mouth 2 (two) times daily. For heartburn 03/16/12  Yes Romero Belling, MD  RELION INSULIN SYR 0.5CC/29G 29G X 1/2" 0.5 ML MISC USE AS DIRECTED 07/21/11  Yes Romero Belling, MD  simvastatin (ZOCOR) 80 MG tablet Take 0.5 tablets (40 mg total) by mouth at bedtime. 03/16/12 03/16/13 Yes Romero Belling, MD  vitamin D, CHOLECALCIFEROL, 400 UNITS tablet Take 400 Units by mouth daily.   Yes Historical Provider, MD  vitamin E (VITAMIN E) 400 UNIT capsule Take 400 Units by mouth daily.   Yes Historical Provider, MD  oxyCODONE-acetaminophen (PERCOCET/ROXICET) 5-325 MG per tablet Take 1-2 tablets by mouth every 6 (six) hours as needed for pain. 07/23/12   Renne Crigler, PA-C   Physical Exam: Blood pressure 127/59, pulse 99, temperature 97.3 F (36.3 C), temperature source Oral, resp. rate 18, weight 79 kg (174 lb 2.6 oz), SpO2 98.00%. Filed Vitals:   07/23/12 2350 07/24/12 0251 07/24/12 0529 07/24/12 0952  BP: 208/68 142/65 140/62 127/59  Pulse: 95 86 90 99  Temp: 97.6 F (36.4 C) 98.2 F (36.8 C)  98.3 F (36.8 C) 97.3 F (36.3 C)  TempSrc:    Oral  Resp: 16 16 18    Weight:      SpO2: 97% 100% 100% 98%   General appearance: alert, cooperative and no distress  Head: Normocephalic, without obvious abnormality, atraumatic  Eyes: conjunctivae/corneas clear. PERRL, EOM's intact. Fundi benign.  Nose: Nares normal. Septum midline. Mucosa normal. No drainage or sinus tenderness.  Throat: lips, mucosa, and tongue normal; teeth and gums normal  Neck: Supple, no masses, no cervical lymphadenopathy, no JVD appreciated, no meningeal signs Resp: clear to auscultation bilaterally  Chest wall: no tenderness  Cardio: regular rate and rhythm, S1, S2 normal, no murmur, click, rub or gallop  GI: soft, non-tender; bowel sounds normal; no masses, no organomegaly  Extremities: Left lower extremity wrapped  Skin: Skin color, texture, turgor normal. No rashes or lesions  Neurologic: Alert and oriented X 3, normal strength and tone. Normal symmetric reflexes. Normal coordination and gait   Labs on Admission:  Basic Metabolic Panel:  Recent Labs Lab 07/23/12 1154  NA 136  K 4.7  CL 101  CO2 24  GLUCOSE 231*  BUN 27*  CREATININE 1.02  CALCIUM 8.8   Liver Function Tests: No results found for this basename: AST, ALT, ALKPHOS, BILITOT, PROT, ALBUMIN,  in the last 168 hours  No results found for this basename: LIPASE, AMYLASE,  in the last 168 hours No results found for this basename: AMMONIA,  in the last 168 hours CBC:  Recent Labs Lab 07/23/12 1154  WBC 17.6*  NEUTROABS 14.4*  HGB 10.9*  HCT 33.4*  MCV 80.3  PLT 360   Cardiac Enzymes: No results found for this basename: CKTOTAL, CKMB, CKMBINDEX, TROPONINI,  in the last 168 hours BNP: No components found with this basename: POCBNP,  CBG:  Recent Labs Lab 07/23/12 1955 07/23/12 2147 07/24/12 0130 07/24/12 0640 07/24/12 1119  GLUCAP 196* 224* 288* 249* 179*    Radiological Exams on Admission: Dg Chest 2 View  07/23/2012    *RADIOLOGY REPORT*  Clinical Data: Ankle pain post fall, trimalleolar fracture- dislocation, preoperative evaluation, history diabetes, hypertension, hyperlipidemia, obstructive sleep apnea, COPD  CHEST - 2 VIEW  Comparison: 04/14/2011  Findings: Upper normal heart size. Mediastinal contours and pulmonary vascularity normal. COPD changes with scattered chronic interstitial prominence in both lungs little changed when accounting for differences in technique. No definite acute infiltrate, pleural effusion or pneumothorax. Bones demineralized.  IMPRESSION: COPD changes with mild stable chronic interstitial prominence. No acute abnormalities.   Original Report Authenticated By: Ulyses Southward, M.D.   Dg Ankle Complete Left  07/23/2012   *RADIOLOGY REPORT*  Clinical Data: Left ankle trimalleolar fracture-dislocation, status post reduction.  LEFT ANKLE COMPLETE - 3+ VIEW  Comparison: Multiple exams, including 07/23/2012 at 4:44 p.m.  Findings: Plaster splint observed, obscuring bony detail.  Prior fiberglass splint is no longer present.  Oblique lateral malleolar fracture in transverse medial malleolar fractures similar to prior.  Distal tibia remains anteriorly dislocated about 1.9 cm with respect to the talar dome.  Posterior malleolar fracture again identified.  Plantar calcaneal spur noted.  IMPRESSION:  1.  Distal tibia remains anteriorly dislocated by 1.9 cm with respect to the talar dome.  Trimalleolar fracture observed.   Original Report Authenticated By: Gaylyn Rong, M.D.   Dg Ankle Complete Left  07/23/2012   *RADIOLOGY REPORT*  Clinical Data: Post reduction.  LEFT ANKLE COMPLETE - 3+ VIEW  Comparison: 07/23/2012 at 1138 hours.  Findings: Fiberglass splint is in place.  Persistent dorsal dislocation of the talus with respect to the distal tibia. Trimalleolar fractures are again noted.  Talar dome appears grossly intact.  Prominent calcaneal spurs.  IMPRESSION: Interval fiberglass splinting of a  trimalleolar ankle fracture dislocation, not significantly changed in appearance.   Original Report Authenticated By: Leanna Battles, M.D.   Dg Ankle Complete Left  07/23/2012   *RADIOLOGY REPORT*  Clinical Data: Ankle pain post fall  LEFT ANKLE COMPLETE - 3+ VIEW  Comparison: None  Findings: Trimalleolar fracture-dislocation left ankle. Oblique lateral malleolar fracture displaced laterally. Transverse medial malleolar fracture displaced laterally. Displaced posterior tibial fracture, intra-articular. Lateral and posterior dislocation of talus at ankle joint. Minimal widening of talonavicular joint. Large plantar calcaneal spur. Associated soft tissue swelling deformity. No additional fracture or dislocation identified.  IMPRESSION: Displaced trimalleolar fracture-dislocation left ankle.   Original Report Authenticated By: Ulyses Southward, M.D.   Dg Ankle Left Port  07/24/2012   *RADIOLOGY REPORT*  Clinical Data: Status post ORIF of ankle fracture.  PORTABLE LEFT ANKLE - 2 VIEW  Comparison: 1 day prior  Findings: Overlying cast obscures bony detail.  AP and lateral views.  Plate and screw fixation of the distal fibula.  Screw fixation of the distal tibia.  Alignment is improved, and is now nearly anatomic.  Small calcaneal spur. No acute  hardware complication.  IMPRESSION: Distal tibia and fibular fixation, with improved alignment.   Original Report Authenticated By: Jeronimo Greaves, M.D.   Dg Ankle Left Port  07/23/2012   *RADIOLOGY REPORT*  Clinical Data: Post reduction attempt  PORTABLE LEFT ANKLE - 2 VIEW  Comparison: Earlier same day (multiple examinations)  Findings:  Evaluation of fine bony detail is degraded secondary to overlying casting material.  Grossly unchanged appearance of trimalleolar fracture dislocation, specifically, there is persistent anterior dislocation of the distal tibia in relation to the talar dome.  No radiopaque foreign body.  Plantar calcaneal spur.  IMPRESSION: No significant change  in alignment of previously identified displaced trimalleolar fracture / dislocation.   Original Report Authenticated By: Tacey Ruiz, MD    Time spent: 70 minutes  [facility] Pager [phone]  If 7PM-7AM, please contact night-coverage [email or web address] Password [facility] 07/24/2012, 11:38 AM

## 2012-07-25 DIAGNOSIS — E119 Type 2 diabetes mellitus without complications: Secondary | ICD-10-CM

## 2012-07-25 LAB — GLUCOSE, CAPILLARY: Glucose-Capillary: 232 mg/dL — ABNORMAL HIGH (ref 70–99)

## 2012-07-25 LAB — HEMOGLOBIN A1C: Mean Plasma Glucose: 194 mg/dL — ABNORMAL HIGH (ref <117)

## 2012-07-25 NOTE — Progress Notes (Signed)
Subjective: 2 Days Post-Op Procedure(s) (LRB): OPEN REDUCTION INTERNAL FIXATION (ORIF) ANKLE FRACTURE (Left) Patient reports pain as mild.  Did very well with PT yesterday.  Concerned about being able to go home on her own.  Plan to talk to her brother today to see if she can go with him.  If not, may need ST-SNF placement.  Objective: Vital signs in last 24 hours: Temp:  [97.3 F (36.3 C)-99.9 F (37.7 C)] 98.2 F (36.8 C) (06/29 0500) Pulse Rate:  [89-108] 89 (06/29 0500) BP: (96-146)/(52-61) 96/52 mmHg (06/29 0500) SpO2:  [92 %-98 %] 93 % (06/29 0500)  Intake/Output from previous day: 06/28 0701 - 06/29 0700 In: 480 [P.O.:480] Out: -  Intake/Output this shift:     Recent Labs  07/23/12 1154  HGB 10.9*    Recent Labs  07/23/12 1154  WBC 17.6*  RBC 4.16  HCT 33.4*  PLT 360    Recent Labs  07/23/12 1154  NA 136  K 4.7  CL 101  CO2 24  BUN 27*  CREATININE 1.02  GLUCOSE 231*  CALCIUM 8.8   No results found for this basename: LABPT, INR,  in the last 72 hours  Sensation intact distally Intact pulses distally Incision: dressing C/D/I, splint not too tight  Moves her toes easily; normal perfusion  Assessment/Plan: 2 Days Post-Op Procedure(s) (LRB): OPEN REDUCTION INTERNAL FIXATION (ORIF) ANKLE FRACTURE (Left) Plan for discharge tomorrow to home vs SNF  Karson Reede Y 07/25/2012, 9:33 AM

## 2012-07-25 NOTE — Evaluation (Signed)
Occupational Therapy Evaluation Patient Details Name: Yvonne Reynolds MRN: [id number] DOB: 12-07-1945 Today's Date: 07/25/2012 Time: 4098-1191 OT Time Calculation (min): 25 min  OT Assessment / Plan / Recommendation History of present illness     Clinical Impression   Pt admitted s/p fall in bathroom with left ankle dislocation/fx. She is now s/p ORIF L ankle and is NWB on LLE.  Pt reports she has 24/7 supervision/assist from brother but reports she may prefer ST SNF (verbalizes that she does not want her brother to have to assist her with toileting and showers).  Will recommend ST SNF and may need to update plan if pt refuses and wishes to return home. Will continue to follow acutely in order to address below problem list.    OT Assessment  Patient needs continued OT Services    Follow Up Recommendations  SNF;Supervision/Assistance - 24 hour    Barriers to Discharge      Equipment Recommendations  3 in 1 bedside comode    Recommendations for Other Services    Frequency  Min 2X/week    Precautions / Restrictions Precautions Precautions: Fall Restrictions Weight Bearing Restrictions: Yes LLE Weight Bearing: Non weight bearing   Pertinent Vitals/Pain See vitals    ADL  Eating/Feeding: Performed;Independent Where Assessed - Eating/Feeding: Edge of bed Grooming: Performed;Wash/dry hands;Set up Where Assessed - Grooming: Unsupported sitting Upper Body Bathing: Simulated;Set up Where Assessed - Upper Body Bathing: Unsupported sitting Lower Body Bathing: Simulated;Minimal assistance Where Assessed - Lower Body Bathing: Supported sit to stand Upper Body Dressing: Simulated;Set up Where Assessed - Upper Body Dressing: Unsupported sitting Lower Body Dressing: Simulated;Maximal assistance Where Assessed - Lower Body Dressing: Supported sit to stand Toilet Transfer: Performed;Minimal assistance Toilet Transfer Method: Surveyor, minerals: Holiday representative and Hygiene: Performed;Minimal assistance Where Assessed - Engineer, mining and Hygiene: Sit to stand from 3-in-1 or toilet Equipment Used: Gait belt;Rolling walker Transfers/Ambulation Related to ADLs: Min assist with RW.  Assist for balance and to move RW safely. ADL Comments: Pt reports she is unsure if she should go home.  Pt knows she willl need 24/7 assist but does not feel comfortable with brother assisting with tasks such as toileting and showering (sister in law works during day). Pt open to considering SNF for rehab.    OT Diagnosis: Generalized weakness;Acute pain  OT Problem List: Decreased strength;Decreased activity tolerance;Impaired balance (sitting and/or standing);Decreased knowledge of use of DME or AE;Pain OT Treatment Interventions: Self-care/ADL training;DME and/or AE instruction;Therapeutic activities;Patient/family education;Balance training   OT Goals(Current goals can be found in the care plan section) Acute Rehab OT Goals OT Goal Formulation: With patient Time For Goal Achievement: 08/01/12 Potential to Achieve Goals: Good  Visit Information  Last OT Received On: 07/25/12 Assistance Needed: +1       Prior Functioning     Home Living Family/patient expects to be discharged to:: Private residence Living Arrangements: Other relatives (brother and sister-in-law) Available Help at Discharge: Family;Available 24 hours/day (brother can be there 24/7) Type of Home: House Home Access: Stairs to enter [employer] of Steps: 1 Entrance Stairs-Rails: None Home Layout: One level Home Equipment: None Prior Function Level of Independence: Independent Communication Communication: No difficulties         Vision/Perception     Cognition  Cognition Arousal/Alertness: Awake/alert Behavior During Therapy: WFL for tasks assessed/performed Overall Cognitive Status: Within Functional Limits for  tasks assessed    Extremity/Trunk Assessment Upper Extremity Assessment Upper Extremity  Assessment: Overall WFL for tasks assessed     Mobility Bed Mobility Bed Mobility: Supine to Sit;Sitting - Scoot to Edge of Bed Supine to Sit: 5: Supervision;With rails;HOB elevated Sitting - Scoot to Edge of Bed: 5: Supervision Transfers Transfers: Sit to Stand;Stand to Sit Sit to Stand: 4: Min assist;With armrests;With upper extremity assist;From chair/3-in-1;From bed Stand to Sit: 4: Min assist;To chair/3-in-1;With armrests;With upper extremity assist Details for Transfer Assistance: Assist for balance due to initial posterior lean once standing. Assist also to control descent to chair.  VCs for safe hand placement.     Exercise     Balance Balance Balance Assessed: Yes Static Sitting Balance Static Sitting - Balance Support: Feet supported;No upper extremity supported Static Sitting - Level of Assistance: 5: Stand by assistance Static Standing Balance Static Standing - Balance Support: Bilateral upper extremity supported Static Standing - Level of Assistance: 4: Min assist Static Standing - Comment/# of Minutes: Pt with occasional posterior lean and requires assist to regain balance while standing in front of 3n1.   End of Session OT - End of Session Equipment Utilized During Treatment: Rolling walker Activity Tolerance: Patient tolerated treatment well Patient left: in chair;with call bell/phone within reach Nurse Communication: Mobility status  GO    07/25/2012 Cipriano Mile OTR/L Pager [phone] Office [phone]  Cipriano Mile 07/25/2012, 3:32 PM

## 2012-07-25 NOTE — Progress Notes (Signed)
[facility] PROGRESS NOTE  Yvonne Reynolds WUJ:[id number] DOB: Jan 24, 1946 DOA: 07/23/2012 PCP: Romero Belling, MD  Assessment/Plan: 1. Diabetes: - BS improved.  -Cont long acting coverage with scheduled pre-meal insulin and SSI while inpatient  Code Status: Full Family Communication: Pt in room (indicate person spoken with, relationship, and if by phone, the number) Disposition Plan: Pending    HPI/Subjective: No complaints  Objective: Filed Vitals:   07/24/12 1413 07/24/12 2100 07/25/12 0500 07/25/12 1004  BP: 146/61 127/52 96/52 116/47  Pulse: 101 108 89 96  Temp: 99.9 F (37.7 C) 99 F (37.2 C) 98.2 F (36.8 C)   TempSrc: Oral     Resp:      Weight:      SpO2: 96% 92% 93%    No intake or output data in the 24 hours ending 07/25/12 1024 Filed Weights   07/23/12 1344  Weight: 79 kg (174 lb 2.6 oz)    Exam:   General:  Awake, in nad  Cardiovascular: regular, s1, s2  Respiratory: normal resp effort, no wheezing  Abdomen: soft, nondistended  Musculoskeletal: perfused, no clubbing   Data Reviewed: Basic Metabolic Panel:  Recent Labs Lab 07/23/12 1154  NA 136  K 4.7  CL 101  CO2 24  GLUCOSE 231*  BUN 27*  CREATININE 1.02  CALCIUM 8.8   Liver Function Tests: No results found for this basename: AST, ALT, ALKPHOS, BILITOT, PROT, ALBUMIN,  in the last 168 hours No results found for this basename: LIPASE, AMYLASE,  in the last 168 hours No results found for this basename: AMMONIA,  in the last 168 hours CBC:  Recent Labs Lab 07/23/12 1154  WBC 17.6*  NEUTROABS 14.4*  HGB 10.9*  HCT 33.4*  MCV 80.3  PLT 360   Cardiac Enzymes: No results found for this basename: CKTOTAL, CKMB, CKMBINDEX, TROPONINI,  in the last 168 hours BNP (last 3 results) No results found for this basename: PROBNP,  in the last 8760 hours CBG:  Recent Labs Lab 07/24/12 0130 07/24/12 0640 07/24/12 1119 07/24/12 1600 07/25/12 0740  GLUCAP 288* 249* 179* 179*  232*    No results found for this or any previous visit (from the past 240 hour(s)).   Studies: Dg Chest 2 View  07/23/2012   *RADIOLOGY REPORT*  Clinical Data: Ankle pain post fall, trimalleolar fracture- dislocation, preoperative evaluation, history diabetes, hypertension, hyperlipidemia, obstructive sleep apnea, COPD  CHEST - 2 VIEW  Comparison: 04/14/2011  Findings: Upper normal heart size. Mediastinal contours and pulmonary vascularity normal. COPD changes with scattered chronic interstitial prominence in both lungs little changed when accounting for differences in technique. No definite acute infiltrate, pleural effusion or pneumothorax. Bones demineralized.  IMPRESSION: COPD changes with mild stable chronic interstitial prominence. No acute abnormalities.   Original Report Authenticated By: Ulyses Southward, M.D.   Dg Ankle Complete Left  07/23/2012   *RADIOLOGY REPORT*  Clinical Data: Left ankle trimalleolar fracture-dislocation, status post reduction.  LEFT ANKLE COMPLETE - 3+ VIEW  Comparison: Multiple exams, including 07/23/2012 at 4:44 p.m.  Findings: Plaster splint observed, obscuring bony detail.  Prior fiberglass splint is no longer present.  Oblique lateral malleolar fracture in transverse medial malleolar fractures similar to prior.  Distal tibia remains anteriorly dislocated about 1.9 cm with respect to the talar dome.  Posterior malleolar fracture again identified.  Plantar calcaneal spur noted.  IMPRESSION:  1.  Distal tibia remains anteriorly dislocated by 1.9 cm with respect to the talar dome.  Trimalleolar fracture observed.  Original Report Authenticated By: Gaylyn Rong, M.D.   Dg Ankle Complete Left  07/23/2012   *RADIOLOGY REPORT*  Clinical Data: Post reduction.  LEFT ANKLE COMPLETE - 3+ VIEW  Comparison: 07/23/2012 at 1138 hours.  Findings: Fiberglass splint is in place.  Persistent dorsal dislocation of the talus with respect to the distal tibia. Trimalleolar fractures are  again noted.  Talar dome appears grossly intact.  Prominent calcaneal spurs.  IMPRESSION: Interval fiberglass splinting of a trimalleolar ankle fracture dislocation, not significantly changed in appearance.   Original Report Authenticated By: Leanna Battles, M.D.   Dg Ankle Complete Left  07/23/2012   *RADIOLOGY REPORT*  Clinical Data: Ankle pain post fall  LEFT ANKLE COMPLETE - 3+ VIEW  Comparison: None  Findings: Trimalleolar fracture-dislocation left ankle. Oblique lateral malleolar fracture displaced laterally. Transverse medial malleolar fracture displaced laterally. Displaced posterior tibial fracture, intra-articular. Lateral and posterior dislocation of talus at ankle joint. Minimal widening of talonavicular joint. Large plantar calcaneal spur. Associated soft tissue swelling deformity. No additional fracture or dislocation identified.  IMPRESSION: Displaced trimalleolar fracture-dislocation left ankle.   Original Report Authenticated By: Ulyses Southward, M.D.   Dg Ankle Left Port  07/24/2012   *RADIOLOGY REPORT*  Clinical Data: Status post ORIF of ankle fracture.  PORTABLE LEFT ANKLE - 2 VIEW  Comparison: 1 day prior  Findings: Overlying cast obscures bony detail.  AP and lateral views.  Plate and screw fixation of the distal fibula.  Screw fixation of the distal tibia.  Alignment is improved, and is now nearly anatomic.  Small calcaneal spur. No acute hardware complication.  IMPRESSION: Distal tibia and fibular fixation, with improved alignment.   Original Report Authenticated By: Jeronimo Greaves, M.D.   Dg Ankle Left Port  07/23/2012   *RADIOLOGY REPORT*  Clinical Data: Post reduction attempt  PORTABLE LEFT ANKLE - 2 VIEW  Comparison: Earlier same day (multiple examinations)  Findings:  Evaluation of fine bony detail is degraded secondary to overlying casting material.  Grossly unchanged appearance of trimalleolar fracture dislocation, specifically, there is persistent anterior dislocation of the distal  tibia in relation to the talar dome.  No radiopaque foreign body.  Plantar calcaneal spur.  IMPRESSION: No significant change in alignment of previously identified displaced trimalleolar fracture / dislocation.   Original Report Authenticated By: Tacey Ruiz, MD    Scheduled Meds: . atorvastatin  40 mg Oral q1800  . calcium carbonate  2 tablet Oral Q breakfast  . cholecalciferol  400 Units Oral Daily  . citalopram  20 mg Oral BID  . docusate sodium  100 mg Oral BID  . enoxaparin (LOVENOX) injection  40 mg Subcutaneous Daily  . insulin aspart  0-15 Units Subcutaneous TID WC  . insulin aspart  10 Units Subcutaneous TID WC  . insulin glargine  60 Units Subcutaneous QHS  . lisinopril  20 mg Oral Daily  . pantoprazole  80 mg Oral Daily  . vitamin E  400 Units Oral Daily   Continuous Infusions: . sodium chloride 50 mL/hr at 07/24/12 0059    Principal Problem:   Displaced trimalleolar fracture of left lower leg Active Problems:   DIABETES MELLITUS, TYPE II    Time spent:    Bula Cavalieri K  [facility] Pager [phone]. If 7PM-7AM, please contact night-coverage at [email or web address], password [facility] 07/25/2012, 10:24 AM  LOS: 2 days

## 2012-07-25 NOTE — Progress Notes (Signed)
Physical Therapy Treatment Patient Details Name: Yvonne Reynolds MRN: [id number] DOB: 01/09/1946 Today's Date: 07/25/2012 Time: 4098-1191 PT Time Calculation (min): 14 min  PT Assessment / Plan / Recommendation  PT Comments   Pt progressing toward goals. Limited gait today due to pain and fatigue. Pt considering SNF due to concerns over personal care (ie hygiene) and her brother being the one to help her. Has not fully decided as yet.   Follow Up Recommendations  Home health PT;Supervision/Assistance - 24 hour           Equipment Recommendations  Rolling walker with 5" wheels;3in1 (PT);Wheelchair (measurements PT);Wheelchair cushion (measurements PT);Other (comment)       Frequency Min 5X/week   Progress towards PT Goals Progress towards PT goals: Progressing toward goals  Plan Current plan remains appropriate (pt considering SNF instead of home with brother)    Precautions / Restrictions Precautions Precautions: Fall Precaution Comments: per pt report her right leg is not very strong.   Restrictions Weight Bearing Restrictions: Yes LLE Weight Bearing: Non weight bearing       Mobility  Bed Mobility Bed Mobility: Sit to Supine;Scooting to HOB Supine to Sit: 5: Supervision;With rails;HOB elevated Sitting - Scoot to Edge of Bed: 5: Supervision Sit to Supine: 5: Supervision;HOB flat Scooting to HOB: 5: Supervision;With rail Details for Bed Mobility Assistance: cues for techinque  Transfers Transfers: Stand Pivot Transfers Sit to Stand: 5: Supervision;From chair/3-in-1;With upper extremity assist;With armrests Stand to Sit: To bed;With upper extremity assist;4: Min assist Stand Pivot Transfers: 4: Min guard Details for Transfer Assistance: no assist to stand up from chair, mod assist to catch balance due to posterior loss of balance with transfering to chair, min assist to sit down safely. cues for hand placement with all transfers. Pt able to take 3-4 hop steps with stand  pivot transfer with RW. Ambulation/Gait Ambulation/Gait Assistance: Not tested (comment)      PT Goals (current goals can now be found in the care plan section) Acute Rehab PT Goals Patient Stated Goal: to go back to being independent as soon as possible, to avoid SNF placement.  PT Goal Formulation: With patient Additional Goals Additional Goal #1: Pt will be able to propel WC with supervision in household type of environment.    Visit Information  Last PT Received On: 07/25/12 Assistance Needed: +1 History of Present Illness: 67 y.o. female admitted to [facility] s/p fall in bathroom with left ankle dislocation/fracture. She is s/p ORIF L ankle and is NWB L leg post-op.      Subjective Data  Patient Stated Goal: to go back to being independent as soon as possible, to avoid SNF placement.    Cognition  Cognition Arousal/Alertness: Awake/alert Behavior During Therapy: WFL for tasks assessed/performed Overall Cognitive Status: Within Functional Limits for tasks assessed    Balance  Balance Balance Assessed: Yes Static Sitting Balance Static Sitting - Balance Support: Feet supported;No upper extremity supported Static Sitting - Level of Assistance: 5: Stand by assistance Static Standing Balance Static Standing - Balance Support: Bilateral upper extremity supported Static Standing - Level of Assistance: 4: Min assist Static Standing - Comment/# of Minutes: Pt with occasional posterior lean and requires assist to regain balance while standing in front of 3n1.  End of Session PT - End of Session Equipment Utilized During Treatment: Gait belt Activity Tolerance: Patient limited by pain;Patient limited by fatigue Patient left: with call bell/phone within reach;with nursing/sitter in room (NT in room to get CBG)  GP     Sallyanne Kuster 07/25/2012, 4:31 PM  Sallyanne Kuster, PTA Office- [phone]

## 2012-07-26 ENCOUNTER — Encounter (HOSPITAL_COMMUNITY): Payer: Self-pay | Admitting: Specialist

## 2012-07-26 DIAGNOSIS — S82853A Displaced trimalleolar fracture of unspecified lower leg, initial encounter for closed fracture: Principal | ICD-10-CM

## 2012-07-26 DIAGNOSIS — E119 Type 2 diabetes mellitus without complications: Secondary | ICD-10-CM

## 2012-07-26 DIAGNOSIS — R109 Unspecified abdominal pain: Secondary | ICD-10-CM

## 2012-07-26 LAB — GLUCOSE, CAPILLARY
Glucose-Capillary: 163 mg/dL — ABNORMAL HIGH (ref 70–99)
Glucose-Capillary: 165 mg/dL — ABNORMAL HIGH (ref 70–99)

## 2012-07-26 MED ORDER — INSULIN GLARGINE 100 UNIT/ML ~~LOC~~ SOLN: 60.0000 [IU] | Freq: Every day | SUBCUTANEOUS | Status: DC

## 2012-07-26 MED ORDER — INSULIN ASPART 100 UNIT/ML ~~LOC~~ SOLN: 10.0000 [IU] | Freq: Three times a day (TID) | SUBCUTANEOUS | Status: DC

## 2012-07-26 NOTE — Progress Notes (Signed)
[facility] PROGRESS NOTE  MAUDRY ZEIDAN ZOX:[id number] DOB: [birth year] DOA: 07/23/2012 PCP: Romero Belling, MD  Assessment/Plan: Diabetes: - BS improved, not yet at goal.  -Cont long acting coverage with scheduled pre-meal insulin and SSI while inpatient -I have prescribed her updated insulin coverage (Lantus plus Humalog). Discussed with pt's pharmacy -> novolog is not covered by her insurance so was changed to Humalog 10 units before meals. Outpt pharmacy is aware of new insulin orders. -Would have PCP titrate insulin as needed -Will sign off for now. Please do not hesitate to call back for any questions. Thank you again for the consult.  HPI/Subjective: No major events.  Objective: Filed Vitals:   07/25/12 1004 07/25/12 1540 07/25/12 2117 07/26/12 0548  BP: 116/47 119/48 144/61 136/57  Pulse: 96 92 94 94  Temp:  98.9 F (37.2 C) 98.5 F (36.9 C) 99.5 F (37.5 C)  TempSrc:      Resp:  18 18 18   Weight:      SpO2:  95% 96% 96%    Intake/Output Summary (Last 24 hours) at 07/26/12 1204 Last data filed at 07/26/12 0548  Gross per 24 hour  Intake    480 ml  Output      0 ml  Net    480 ml   Filed Weights   07/23/12 1344  Weight: 79 kg (174 lb 2.6 oz)    Exam:   General:  Awake, in nad  Cardiovascular: regular, s1, s2  Respiratory: normal resp effort, no wheezing  Abdomen: soft, nondistended  Musculoskeletal: perfused, no clubbing   Data Reviewed: Basic Metabolic Panel:  Recent Labs Lab 07/23/12 1154  NA 136  K 4.7  CL 101  CO2 24  GLUCOSE 231*  BUN 27*  CREATININE 1.02  CALCIUM 8.8   Liver Function Tests: No results found for this basename: AST, ALT, ALKPHOS, BILITOT, PROT, ALBUMIN,  in the last 168 hours No results found for this basename: LIPASE, AMYLASE,  in the last 168 hours No results found for this basename: AMMONIA,  in the last 168 hours CBC:  Recent Labs Lab 07/23/12 1154  WBC 17.6*  NEUTROABS 14.4*  HGB 10.9*  HCT  33.4*  MCV 80.3  PLT 360   Cardiac Enzymes: No results found for this basename: CKTOTAL, CKMB, CKMBINDEX, TROPONINI,  in the last 168 hours BNP (last 3 results) No results found for this basename: PROBNP,  in the last 8760 hours CBG:  Recent Labs Lab 07/25/12 1132 07/25/12 1613 07/25/12 2120 07/26/12 0633 07/26/12 1057  GLUCAP 70 177* 163* 226* 165*    No results found for this or any previous visit (from the past 240 hour(s)).   Studies: No results found.  Scheduled Meds: . atorvastatin  40 mg Oral q1800  . calcium carbonate  2 tablet Oral Q breakfast  . cholecalciferol  400 Units Oral Daily  . citalopram  20 mg Oral BID  . docusate sodium  100 mg Oral BID  . enoxaparin (LOVENOX) injection  40 mg Subcutaneous Daily  . insulin aspart  0-15 Units Subcutaneous TID WC  . insulin aspart  10 Units Subcutaneous TID WC  . insulin glargine  60 Units Subcutaneous QHS  . lisinopril  20 mg Oral Daily  . pantoprazole  80 mg Oral Daily  . vitamin E  400 Units Oral Daily   Continuous Infusions: . sodium chloride 50 mL/hr at 07/24/12 0059    Principal Problem:   Displaced trimalleolar fracture of left lower leg  Active Problems:   DIABETES MELLITUS, TYPE II    Time spent:    CHIU, STEPHEN K  [facility] Pager [phone]. If 7PM-7AM, please contact night-coverage at [email or web address], password [facility] 07/26/2012, 12:04 PM  LOS: 3 days

## 2012-07-26 NOTE — Progress Notes (Signed)
Clinical Social Work Department  BRIEF PSYCHOSOCIAL ASSESSMENT  Patient:Yvonne Reynolds Account Number: [id number]  Admit date: 07/23/12 Clinical Social Worker Sabino Niemann, MSW Date/Time: 07/26/2012 2:00 PM Referred by: Physician Date Referred:  Referred for   SNF Placement   Other Referral:  Interview type: Patient  Other interview type: PSYCHOSOCIAL DATA  Living Status: Alone Admitted from facility:  Level of care:  Primary support name: [name],PAT  Primary support relationship to patient: Friend Degree of support available:  Fair  CURRENT CONCERNS  Current Concerns   Post-Acute Placement   Other Concerns:  SOCIAL WORK ASSESSMENT / PLAN  CSW met with pt re: PT recommendation for SNF.   Pt lives alone  CSW explained placement process and answered questions.   Pt reports [facility] [place] as her preference    CSW completed FL2 and initiated SNF search.     Assessment/plan status: Information/Referral to [facility]  Other assessment/ plan:  Information/referral to community resources:  SNF   PTAR  PATIENT'S/FAMILY'S RESPONSE TO PLAN OF CARE:  Pt  reports she is agreeable to ST SNF in order to increase strength and independence with mobility prior to returning home  Pt verbalized understanding of placement process and appreciation for CSW assist.   Sabino Niemann, MSW [phone]

## 2012-07-26 NOTE — Progress Notes (Signed)
Subjective: 3 Days Post-Op Procedure(s) (LRB): OPEN REDUCTION INTERNAL FIXATION (ORIF) ANKLE FRACTURE (Left) Patient reports pain as mild.   Complains of  Constipation.  Getting enema today No help at home as she lives alone.  Will need SNF for rehab  Objective: Vital signs in last 24 hours: Temp:  [98.5 F (36.9 C)-99.5 F (37.5 C)] 99.5 F (37.5 C) (06/30 0548) Pulse Rate:  [92-94] 94 (06/30 0548) Resp:  [18] 18 (06/30 0548) BP: (119-144)/(48-61) 136/57 mmHg (06/30 0548) SpO2:  [95 %-96 %] 96 % (06/30 0548)  Intake/Output from previous day: 06/29 0701 - 06/30 0700 In: 960 [P.O.:960] Out: -  Intake/Output this shift:     Recent Labs  07/23/12 1154  HGB 10.9*    Recent Labs  07/23/12 1154  WBC 17.6*  RBC 4.16  HCT 33.4*  PLT 360    Recent Labs  07/23/12 1154  NA 136  K 4.7  CL 101  CO2 24  BUN 27*  CREATININE 1.02  GLUCOSE 231*  CALCIUM 8.8   No results found for this basename: LABPT, INR,  in the last 72 hours  Incision: dressing C/D/I cap refill of toes intact  Assessment/Plan: 3 Days Post-Op Procedure(s) (LRB): OPEN REDUCTION INTERNAL FIXATION (ORIF) ANKLE FRACTURE (Left) Discharge to SNF when bed arranged.  Pt lives alone and has no assist from family. meds for BM today lovenox while here and will plan to dc with aspirin Neill Jurewicz M 07/26/2012, 11:23 AM

## 2012-07-26 NOTE — Progress Notes (Signed)
Physical Therapy Treatment Patient Details Name: Yvonne Reynolds MRN: [id number] DOB: 1945/06/09 Today's Date: 07/26/2012 Time: 4098-1191 PT Time Calculation (min): 20 min  PT Assessment / Plan / Recommendation  PT Comments   Continuing progress with mobility, though slow; Pt is hesitant to dc to her brother's home, as she will not have prn assist; I agree with dc to SNF fro rehab to maximize independence and safety with mobiltiy prior to dc home  Follow Up Recommendations  Supervision/Assistance - 24 hour;SNF     Does the patient have the potential to tolerate intense rehabilitation     Barriers to Discharge        Equipment Recommendations  Rolling walker with 5" wheels;3in1 (PT);Wheelchair (measurements PT);Wheelchair cushion (measurements PT);Other (comment)    Recommendations for Other Services    Frequency Min 5X/week   Progress towards PT Goals Progress towards PT goals: Progressing toward goals  Plan Discharge plan needs to be updated (pt considering SNF instead of home with brother)    Precautions / Restrictions Precautions Precautions: Fall Precaution Comments: per pt report her right leg is not very strong.   Restrictions LLE Weight Bearing: Non weight bearing   Pertinent Vitals/Pain 8/10 LLE RN provided medication to assist with pain control elevated for edema and pain control     Mobility  Bed Mobility Bed Mobility: Scooting to HOB;Supine to Sit Supine to Sit: 5: Supervision;With rails;HOB elevated Sitting - Scoot to Edge of Bed: 5: Supervision Details for Bed Mobility Assistance: cues for techinque  Transfers Transfers: Stand Pivot Transfers Sit to Stand: 5: Supervision;From chair/3-in-1;With upper extremity assist;With armrests Stand to Sit: With upper extremity assist;4: Min assist;To chair/3-in-1 Details for Transfer Assistance: Improved steadiness today, with small loss of balance, from which pt recovered without physical assist; Cues for safety and  hand placement Ambulation/Gait Ambulation/Gait Assistance: 4: Min guard Ambulation Distance (Feet): 12 Feet Assistive device: Rolling walker Ambulation/Gait Assistance Details: Very good maintenance of NWBing; Noted fatigue toward end of walk, and pt slightly impulsive, hastening to chair, and losing balance, requiring close guard assist Gait Pattern: Step-to pattern    Exercises     PT Diagnosis:    PT Problem List:   PT Treatment Interventions:     PT Goals (current goals can now be found in the care plan section) Acute Rehab PT Goals Patient Stated Goal: back to being independent  Visit Information  Last PT Received On: 07/26/12 Assistance Needed: +1 History of Present Illness: 67 y.o. female admitted to [facility] s/p fall in bathroom with left ankle dislocation/fracture. She is s/p ORIF L ankle and is NWB L leg post-op.      Subjective Data  Patient Stated Goal: back to being independent   Cognition  Cognition Arousal/Alertness: Awake/alert Behavior During Therapy: WFL for tasks assessed/performed;Flat affect Overall Cognitive Status: Within Functional Limits for tasks assessed    Balance     End of Session PT - End of Session Equipment Utilized During Treatment: Gait belt Activity Tolerance: Patient limited by pain;Patient limited by fatigue Patient left: with call bell/phone within reach;in chair   GP     Van Clines [facility] [place], [phone] [phone]  07/26/2012, 1:12 PM

## 2012-07-26 NOTE — Clinical Social Work Placement (Addendum)
Clinical Social Work Department  CLINICAL SOCIAL WORK PLACEMENT NOTE  07/26/2012 Patient: Yvonne Reynolds  Account Number:  [id number] Admit date: 07/23/12  Clinical Social Worker: Sabino Niemann MSW Date/time: 07/26/2012 2:30 PM  Clinical Social Work is seeking post-discharge placement for this patient at the following level of care: SKILLED NURSING (*CSW will update this form in Epic as items are completed)  07/26/2012 Patient/family provided with [facility] Department of Clinical Social Work's list of facilities offering this level of care within the geographic area requested by the patient (or if unable, by the patient's family).  07/26/2012 Patient/family informed of their freedom to choose among providers that offer the needed level of care, that participate in Medicare, Medicaid or managed care program needed by the patient, have an available bed and are willing to accept the patient.  07/26/2012 Patient/family informed of [facility]' ownership interest in [facility], as well as of the fact that they are under no obligation to receive care at this facility.  PASARR submitted to EDS on6/30/2014 PASARR number received from EDS on 07/26/2012  FL2 transmitted to all facilities in geographic area requested by pt/family on 07/26/2012 FL2 transmitted to all facilities within larger geographic area on  Patient informed that his/her managed care company has contracts with or will negotiate with certain facilities, including the following:  Patient/family informed of bed offers received:  Patient chooses bed at [facility] Physician recommends and patient chooses bed at  Patient to be transferred to on Private Vehicle Patient to be transferred to facility by 07/28/2012 The following physician request were entered in Epic:  Additional Comments:

## 2012-07-26 NOTE — Progress Notes (Signed)
Patient examined and lab reviewed with Vernon PA-C. Patient examined and lab reviewed with Vernon PA-C.  

## 2012-07-26 NOTE — Progress Notes (Addendum)
Inpatient Diabetes Program Recommendations  AACE/ADA: New Consensus Statement on Inpatient Glycemic Control (2013)  Target Ranges:  Prepandial:   less than 140 mg/dL      Peak postprandial:   less than 180 mg/dL (1-2 hours)      Critically ill patients:  140 - 180 mg/dL   Reason for Visit: Results for Yvonne Reynolds, Yvonne Reynolds (MRN [id number]) as of 07/26/2012 14:31  Ref. Range 07/23/2012 21:47 07/24/2012 01:30 07/24/2012 06:40 07/24/2012 11:19 07/24/2012 16:00 07/25/2012 07:40 07/25/2012 11:32 07/25/2012 16:13 07/25/2012 21:20 07/26/2012 06:33 07/26/2012 10:57  Glucose-Capillary Latest Range: 70-99 mg/dL 409 (H) 811 (H) 914 (H) 179 (H) 179 (H) 232 (H) 70 177 (H) 163 (H) 226 (H) 165 (H)   Note change to insulin regimen.  CBG's improved.  Will follow.   1500  Spoke briefly to patient regarding change in insulin regimen to Basal/Bolus regimen including Lantus and Novolog with meals.  Patient states that she had "lots" of hypoglycemic events on the NPH only regimen.  She seems okay with taking 4 shots a day.   Will initially go to SNF for rehab.

## 2012-07-26 NOTE — Progress Notes (Signed)
UR COMPLETED  

## 2012-07-27 LAB — GLUCOSE, CAPILLARY
Glucose-Capillary: 194 mg/dL — ABNORMAL HIGH (ref 70–99)
Glucose-Capillary: 180 mg/dL — ABNORMAL HIGH (ref 70–99)
Glucose-Capillary: 177 mg/dL — ABNORMAL HIGH (ref 70–99)

## 2012-07-27 NOTE — Clinical Social Work Note (Signed)
CSW met with pt at bedside.  Pt was alert and oriented.  Pt was brushing her hair.  CSW presented bed offers and reviewed them with the pt.  Pt was advised that [facility] did not make a bed offer.  Pt stated that she was not aware of another facility.  CSW reviewed the list again with the pt.  Pt was encouraged to speak to family and friends re: offers.  Pt stated that she could not make a decision without her family.  Pt stated that family would be at bedside later this evening and the pt would have an answer by the morning re: her decision for SNF/choice.  Unit CSW to f/u in the morning.  Pt was notified that her insurance requires an approval and CSW would need her SNF choice prior to seeking the insurance authorization.  Pt agreeable to give choice in the morning (WED).  Unit CSW aware.  Vickii Penna, LCSWA [phone]  Clinical Social Work

## 2012-07-27 NOTE — Progress Notes (Signed)
Physical Therapy Treatment Patient Details Name: Yvonne Reynolds MRN: [id number] DOB: [birth year] Today's Date: 07/27/2012 Time: 4098-1191 PT Time Calculation (min): 33 min  PT Assessment / Plan / Recommendation  PT Comments   Continuing progress, still requiring cues for safety and hand placement during transfers  Follow Up Recommendations  Supervision/Assistance - 24 hour;SNF     Does the patient have the potential to tolerate intense rehabilitation     Barriers to Discharge        Equipment Recommendations  Rolling walker with 5" wheels;3in1 (PT);Wheelchair (measurements PT);Wheelchair cushion (measurements PT);Other (comment)    Recommendations for Other Services    Frequency Min 5X/week   Progress towards PT Goals Progress towards PT goals: Progressing toward goals  Plan Discharge plan needs to be updated (pt considering SNF instead of home with brother)    Precautions / Restrictions Precautions Precautions: Fall Precaution Comments: per pt report her right leg is not very strong.   Restrictions Weight Bearing Restrictions: Yes LLE Weight Bearing: Non weight bearing   Pertinent Vitals/Pain Sore LLE did not rate elevated for edema and pain control     Mobility  Bed Mobility Bed Mobility: Scooting to HOB;Supine to Sit Supine to Sit: 5: Supervision;With rails;HOB elevated Sitting - Scoot to Edge of Bed: 5: Supervision Details for Bed Mobility Assistance: cues for techinque  Transfers Transfers: Stand Pivot Transfers;Sit to Stand;Stand to Sit Sit to Stand: 5: Supervision;From chair/3-in-1;With upper extremity assist;With armrests Stand to Sit: With upper extremity assist;4: Min assist;To chair/3-in-1 Stand Pivot Transfers: 4: Min guard Details for Transfer Assistance:  Cues for safety and hand placement Ambulation/Gait Ambulation/Gait Assistance: 4: Min guard Ambulation Distance (Feet): 15 Feet Assistive device: Rolling walker Ambulation/Gait Assistance Details:  Better control of hop-steps; fatigues quickly Gait Pattern: Step-to pattern Wheelchair Mobility Wheelchair Mobility: Yes Wheelchair Assistance: 3: Armed forces technical officer Details (indicate cue type and reason): Verbal, demo and hand-over-hand cues and assist for parts management and Tourist information centre manager: Both upper extremities;Right lower extremity Wheelchair Parts Management: Needs assistance Distance: 12    Exercises     PT Diagnosis:    PT Problem List:   PT Treatment Interventions:     PT Goals (current goals can now be found in the care plan section) Acute Rehab PT Goals Patient Stated Goal: " Not sure If my brother can help me at home like I need " Time For Goal Achievement: 07/31/12 Potential to Achieve Goals: Good  Visit Information  Last PT Received On: 07/27/12 Assistance Needed: +1 PT/OT Co-Evaluation/Treatment: Yes History of Present Illness: 67 y.o. female admitted to [facility] s/p fall in bathroom with left ankle dislocation/fracture. She is s/p ORIF L ankle and is NWB L leg post-op.      Subjective Data  Patient Stated Goal: " Not sure If my brother can help me at home like I need "   Cognition  Cognition Arousal/Alertness: Awake/alert Behavior During Therapy: WFL for tasks assessed/performed;Flat affect Overall Cognitive Status: Within Functional Limits for tasks assessed    Balance  Balance Balance Assessed: No  End of Session PT - End of Session Equipment Utilized During Treatment: Gait belt Activity Tolerance: Patient limited by pain;Patient limited by fatigue Patient left: with call bell/phone within reach;in chair   GP     Van Clines [facility] [place], [place] [phone]  07/27/2012, 3:36 PM

## 2012-07-27 NOTE — Progress Notes (Signed)
Subjective: 4 Days Post-Op Procedure(s) (LRB): OPEN REDUCTION INTERNAL FIXATION (ORIF) ANKLE FRACTURE (Left) Awake, alert and Oreinted x 4. C/o some neck discomfort today, right side with minimal  Spasm. No anesthesia or paresthesia either UE or LE. C/o some left rib discomfort. Will check pre op CXR. Patient reports pain as mild.    Objective:   VITALS:  Temp:  [98.5 F (36.9 C)-98.6 F (37 C)] 98.6 F (37 C) (07/01 0519) Pulse Rate:  [74-91] 74 (07/01 0519) Resp:  [18] 18 (07/01 0519) BP: (121-124)/(42-56) 124/56 mmHg (07/01 0519) SpO2:  [94 %-96 %] 96 % (07/01 0519)  Neurologically intact ABD soft Neurovascular intact Intact pulses distally Incision: dressing C/D/I   LABS No results found for this basename: HGB, WBC, PLT,  in the last 72 hours No results found for this basename: NA, K, CL, CO2, BUN, CREATININE, GLUCOSE,  in the last 72 hours No results found for this basename: LABPT, INR,  in the last 72 hours   Assessment/Plan: 4 Days Post-Op Procedure(s) (LRB): OPEN REDUCTION INTERNAL FIXATION (ORIF) ANKLE FRACTURE (Left)  Advance diet Up with therapy Discharge to SNF Check CXR preop for any sign of left rib changes.  Yvonne Reynolds E 07/27/2012, 12:26 PM

## 2012-07-27 NOTE — Progress Notes (Signed)
Occupational Therapy Treatment Patient Details Name: Yvonne Reynolds MRN: [id number] DOB: November 01, 1945 Today's Date: 07/27/2012 Time: 9147-8295 OT Time Calculation (min): 25 min  OT Assessment / Plan / Recommendation  OT comments  Pt making progress, however, continues to require cues for safety during functional mobility. Pt educated on safety using w/c to lock brakes and for w/c mobility  Follow Up Recommendations  SNF;Supervision/Assistance - 24 hour    Barriers to Discharge   Pt lives at home alone and her brother could only provided some assist, pt unsure if she wants to go to short term SNF or home     Equipment Recommendations  3 in 1 bedside comode    Recommendations for Other Services    Frequency Min 2X/week   Progress towards OT Goals Progress towards OT goals: Progressing toward goals  Plan Discharge plan remains appropriate    Precautions / Restrictions Precautions Precautions: Fall Restrictions Weight Bearing Restrictions: Yes LLE Weight Bearing: Non weight bearing   Pertinent Vitals/Pain 3/10    ADL  Grooming: Performed;Wash/dry hands;Wash/dry face;Brushing hair;Min guard Where Assessed - Grooming: Supported standing Toilet Transfer: Performed;Min guard;Minimal Dentist Method: Sit to Barista: Raised toilet seat without arms;Grab bars Toileting - Clothing Manipulation and Hygiene: Performed;Minimal assistance;Min guard Where Assessed - Glass blower/designer Manipulation and Hygiene: Standing Equipment Used: Gait belt;Rolling walker;Other (comment) (3 in 1 over toilet) Transfers/Ambulation Related to ADLs: Min assist with RW.  Assist for balance and to move RW safely. Cues for saftey with correct hand placemenr and technique ADL Comments: Pt reports she is unsure if she should go home.  Pt knows she willl need 24/7 assist but does not feel comfortable with brother assisting with tasks such as toileting and showering (sister  in law works during day). Pt open to considering SNF for rehab.    OT Diagnosis:    OT Problem List:   OT Treatment Interventions:     OT Goals(current goals can now be found in the care plan section) Acute Rehab OT Goals Patient Stated Goal: " Not sure If my brother can help me at home like I need "  Visit Information  Last OT Received On: 07/27/12 Assistance Needed: +1 PT/OT Co-Evaluation/Treatment: Yes History of Present Illness: 67 y.o. female admitted to [facility] s/p fall in bathroom with left ankle dislocation/fracture. She is s/p ORIF L ankle and is NWB L leg post-op.      Subjective Data      Prior Functioning       Cognition  Cognition Arousal/Alertness: Awake/alert Behavior During Therapy: WFL for tasks assessed/performed;Flat affect Overall Cognitive Status: Within Functional Limits for tasks assessed    Mobility  Bed Mobility Bed Mobility: Not assessed Transfers Transfers: Sit to Stand;Stand to Sit Sit to Stand: With upper extremity assist;From chair/3-in-1;With armrests;4: Min assist Stand to Sit: With upper extremity assist;To chair/3-in-1;4: Min guard Details for Transfer Assistance:  Cues for safety and hand placement    Exercises      Balance Balance Balance Assessed: No   End of Session OT - End of Session Equipment Utilized During Treatment: Rolling walker;Other (comment) (3 in 1 over toilet) Activity Tolerance: Patient tolerated treatment well Patient left: in chair;with call bell/phone within reach  GO     Galen Manila 07/27/2012, 2:41 PM

## 2012-07-28 ENCOUNTER — Other Ambulatory Visit: Payer: Self-pay

## 2012-07-28 MED ORDER — ASPIRIN EC 325 MG PO TBEC: 325.0000 mg | DELAYED_RELEASE_TABLET | Freq: Two times a day (BID) | ORAL | Status: DC

## 2012-07-28 MED ORDER — OXYCODONE-ACETAMINOPHEN 5-325 MG PO TABS: 1.0000 | ORAL_TABLET | ORAL | Status: DC | PRN

## 2012-07-28 MED ORDER — DSS 100 MG PO CAPS: 100.0000 mg | ORAL_CAPSULE | Freq: Two times a day (BID) | ORAL | Status: DC

## 2012-07-28 NOTE — Progress Notes (Signed)
Clinical social worker assisted with patient discharge to skilled nursing facility, [facility].  CSW addressed all family questions and concerns. CSW copied chart and added all important documents. CSW also set up patient transportation with [employer]. Clinical Social Worker will sign off for now as social work intervention is no longer needed.

## 2012-07-28 NOTE — ED Provider Notes (Signed)
Medical screening examination/treatment/procedure(s) were conducted as a shared visit with non-physician practitioner(s) and myself.  I personally evaluated the patient during the encounter. Patient with trimalleolar fracture dislocation. Unable to maintain reduction after 2 attempts. Will be admitted  Procedural sedation Performed by: Billee Cashing. Consent: Verbal consent obtained. Risks and benefits: risks, benefits and alternatives were discussed Required items: required blood products, implants, devices, and special equipment available Patient identity confirmed: arm band and provided demographic data Time out: Immediately prior to procedure a "time out" was called to verify the correct patient, procedure, equipment, support staff and site/side marked as required.  Sedation type: moderate (conscious) sedation NPO time confirmed and considedered  Sedatives:  etomidate followed by propofol   Physician Time at Bedside: 10 min  Vitals: Vital signs were monitored during sedation. Cardiac Monitor, pulse oximeter Patient tolerance: Patient tolerated the procedure well with no immediate complications. Comments: Pt with uneventful recovered. Returned to pre-procedural sedation baseline  Procedural sedation Performed by: Billee Cashing.  Consent: Verbal consent obtained. Risks and benefits: risks, benefits and alternatives were discussed Required items: required blood products, implants, devices, and special equipment available Patient identity confirmed: arm band and provided demographic data Time out: Immediately prior to procedure a "time out" was called to verify the correct patient, procedure, equipment, support staff and site/side marked as required.  Sedation type: moderate (conscious) sedation NPO time confirmed and considedered  Sedatives: PROPOFOL  Physician Time at Bedside: 10  Vitals: Vital signs were monitored during sedation. Cardiac Monitor, pulse  oximeter Patient tolerance: Patient tolerated the procedure well with no immediate complications. Comments: Pt with uneventful recovered. Returned to pre-procedural sedation baseline  both reductions showed improved alignment and decreased deformity. Followup x-rays did not show reduction.   Juliet Rude. Rubin Payor, MD 07/28/12 1308

## 2012-07-28 NOTE — Progress Notes (Signed)
Subjective: 5 Days Post-Op Procedure(s) (LRB): OPEN REDUCTION INTERNAL FIXATION (ORIF) ANKLE FRACTURE (Left) Awake, alert and Oriented x 4. Still has not decided of SNF as yet, [facility] was preference but No offer given by [facility]. Mild discomfort.  PT notes recommend 24 hour care vs SNF. Patient reports pain as mild.    Objective:   VITALS:  Temp:  [97.9 F (36.6 C)-99.5 F (37.5 C)] 97.9 F (36.6 C) (07/02 0600) Pulse Rate:  [73-86] 78 (07/02 0600) Resp:  [16] 16 (07/02 0600) BP: (101-146)/(43-60) 117/58 mmHg (07/02 0600) SpO2:  [95 %-97 %] 96 % (07/02 0600)  Neurologically intact ABD soft Sensation intact distally Intact pulses distally Dorsiflexion/Plantar flexion intact Incision: dressing C/D/I and Short leg splint intact. Compartment soft   LABS No results found for this basename: HGB, WBC, PLT,  in the last 72 hours No results found for this basename: NA, K, CL, CO2, BUN, CREATININE, GLUCOSE,  in the last 72 hours No results found for this basename: LABPT, INR,  in the last 72 hours   Assessment/Plan: 5 Days Post-Op Procedure(s) (LRB): OPEN REDUCTION INTERNAL FIXATION (ORIF) ANKLE FRACTURE (Left)  Advance diet Up with therapy Discharge to SNF Fl-2 SIGNED. Apurva Reily E 07/28/2012, 8:14 AM

## 2012-07-28 NOTE — Telephone Encounter (Signed)
Spoke with pharmacist, pt needed a refill on celexa, wanted to make sure dr. Everardo All was aware that pt is taking both Celexa and Zegerid, and he is aware

## 2012-07-28 NOTE — Discharge Summary (Signed)
Physician Discharge Summary  Patient ID: Yvonne Reynolds MRN: [id number] DOB/AGE: 1945/11/24 67 y.o.  Admit date: 07/23/2012 Discharge date: 07/28/2012  Admission Diagnoses:  Displaced trimalleolar fracture of left lower leg  Discharge Diagnoses:  Principal Problem:   Displaced trimalleolar fracture of left lower leg Active Problems:   DIABETES MELLITUS, TYPE II   Past Medical History  Diagnosis Date  . 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 DIABETES MELLITUS, TYPE II 12/21/2006  . DIABETIC  RETINOPATHY 12/22/2006  . HYPERLIPIDEMIA 12/22/2006  . DEPRESSION 06/26/2006  . OBSTRUCTIVE SLEEP APNEA 12/22/2006    no CPAP  . HYPERTENSION 06/26/2006  . ALLERGIC RHINITIS 12/22/2006  . CHRONIC OBSTRUCTIVE PULMONARY DISEASE, ACUTE EXACERBATION 08/24/2007  . Reflux esophagitis 12/22/2006  . GERD 06/26/2006    with EE  . BARRETTS ESOPHAGUS 12/22/2006    without dysplasia  . HIATAL HERNIA 12/22/2006  . OSTEOARTHRITIS 06/26/2006  . OSTEOPOROSIS 06/26/2006    Surgeries: Procedure(s): OPEN REDUCTION INTERNAL FIXATION (ORIF) ANKLE FRACTURE on 07/23/2012   Consultants (if any):  hospitialist  Discharged Condition: Improved  Hospital Course: Yvonne Reynolds is an 67 y.o. female who was admitted 07/23/2012 with a diagnosis of Displaced trimalleolar fracture of left lower leg and went to the operating room on 07/23/2012 and underwent the above named procedures.    She was given perioperative antibiotics:  Anti-infectives   Start     Dose/Rate Route Frequency Ordered Stop   07/24/12 0600  ceFAZolin (ANCEF) IVPB 2 g/50 mL premix  Status:  Discontinued     2 g 100 mL/hr over 30 Minutes Intravenous On call to O.R. 07/23/12 2342 07/23/12 2350   07/24/12 0200  ceFAZolin (ANCEF) IVPB 2 g/50 mL premix     2 g 100 mL/hr over 30 Minutes Intravenous Every 6 hours 07/23/12 2342 07/24/12 1409    consult called to [facility] to  assist with diabetic control.  Adjustments made in medications and advise given to have PCP, Romero Belling, titrate insulin on an outpt basis. Blood sugar controlled during hospital stay with SSI.  She was given sequential compression devices, early ambulation, and Lovenox for DVT prophylaxis. At discharge lovenox discontinued and pt started on aspirin 325mg  bid for anticoagulation She was slow with advancing with activity and felt unsafe for discharge to home.  SNF was felt to be the best option.  She benefited maximally from the hospital stay and there were no complications.    Recent vital signs:  Filed Vitals:   07/28/12 0600  BP: 117/58  Pulse: 78  Temp: 97.9 F (36.6 C)  Resp: 16    Recent laboratory studies:  Lab Results  Component Value Date   HGB 10.9* 07/23/2012   HGB 11.2* 06/29/2012   HGB 15.6* 06/18/2009   Lab Results  Component Value Date   WBC 17.6* 07/23/2012   PLT 360 07/23/2012   Lab Results  Component Value Date   INR 0.84 06/18/2009   Lab Results  Component Value Date   NA 136 07/23/2012   K 4.7 07/23/2012   CL 101 07/23/2012   CO2 24 07/23/2012   BUN 27* 07/23/2012   CREATININE 1.02 07/23/2012   GLUCOSE 231* 07/23/2012    Discharge Medications:     Medication List    STOP taking these medications       insulin NPH 100 UNIT/ML injection  Commonly known as:  HUMULIN N,NOVOLIN N      TAKE these  medications       aspirin EC 325 MG tablet  Take 1 tablet (325 mg total) by mouth 2 (two) times daily.     calcium carbonate 600 MG Tabs  Commonly known as:  OS-CAL  Take 1,200 mg by mouth daily.     citalopram 20 MG tablet  Commonly known as:  CELEXA  Take 1 tablet (20 mg total) by mouth 2 (two) times daily.     DSS 100 MG Caps  Take 100 mg by mouth 2 (two) times daily.     insulin aspart 100 UNIT/ML injection  Commonly known as:  novoLOG  Inject 10 Units into the skin 3 (three) times daily with meals.     insulin glargine 100 UNIT/ML injection   Commonly known as:  LANTUS  Inject 0.6 mLs (60 Units total) into the skin at bedtime.     lisinopril 20 MG tablet  Commonly known as:  PRINIVIL,ZESTRIL  Take 1 tablet (20 mg total) by mouth daily.     omeprazole-sodium bicarbonate 40-1100 MG per capsule  Commonly known as:  ZEGERID  Take 1 capsule by mouth 2 (two) times daily. For heartburn     ONE TOUCH ULTRA TEST test strip  Generic drug:  glucose blood  Two times a day dx 250.01     oxyCODONE-acetaminophen 5-325 MG per tablet  Commonly known as:  PERCOCET/ROXICET  Take 1-2 tablets by mouth every 6 (six) hours as needed for pain.     oxyCODONE-acetaminophen 5-325 MG per tablet  Commonly known as:  PERCOCET/ROXICET  Take 1-2 tablets by mouth every 4 (four) hours as needed.     RELION INSULIN SYR 0.5CC/29G 29G X 1/2" 0.5 ML Misc  Generic drug:  INSULIN SYRINGE .5CC/29G  USE AS DIRECTED     simvastatin 80 MG tablet  Commonly known as:  ZOCOR  Take 0.5 tablets (40 mg total) by mouth at bedtime.     vitamin D (CHOLECALCIFEROL) 400 UNITS tablet  Take 400 Units by mouth daily.     vitamin E 400 UNIT capsule  Generic drug:  vitamin E  Take 400 Units by mouth daily.        Diagnostic Studies: Dg Chest 2 View  07/23/2012   *RADIOLOGY REPORT*  Clinical Data: Ankle pain post fall, trimalleolar fracture- dislocation, preoperative evaluation, history diabetes, hypertension, hyperlipidemia, obstructive sleep apnea, COPD  CHEST - 2 VIEW  Comparison: 04/14/2011  Findings: Upper normal heart size. Mediastinal contours and pulmonary vascularity normal. COPD changes with scattered chronic interstitial prominence in both lungs little changed when accounting for differences in technique. No definite acute infiltrate, pleural effusion or pneumothorax. Bones demineralized.  IMPRESSION: COPD changes with mild stable chronic interstitial prominence. No acute abnormalities.   Original Report Authenticated By: Ulyses Southward, M.D.   Dg Ankle Complete  Left  07/23/2012   *RADIOLOGY REPORT*  Clinical Data: Left ankle trimalleolar fracture-dislocation, status post reduction.  LEFT ANKLE COMPLETE - 3+ VIEW  Comparison: Multiple exams, including 07/23/2012 at 4:44 p.m.  Findings: Plaster splint observed, obscuring bony detail.  Prior fiberglass splint is no longer present.  Oblique lateral malleolar fracture in transverse medial malleolar fractures similar to prior.  Distal tibia remains anteriorly dislocated about 1.9 cm with respect to the talar dome.  Posterior malleolar fracture again identified.  Plantar calcaneal spur noted.  IMPRESSION:  1.  Distal tibia remains anteriorly dislocated by 1.9 cm with respect to the talar dome.  Trimalleolar fracture observed.   Original Report Authenticated By:  Gaylyn Rong, M.D.   Dg Ankle Complete Left  07/23/2012   *RADIOLOGY REPORT*  Clinical Data: Post reduction.  LEFT ANKLE COMPLETE - 3+ VIEW  Comparison: 07/23/2012 at 1138 hours.  Findings: Fiberglass splint is in place.  Persistent dorsal dislocation of the talus with respect to the distal tibia. Trimalleolar fractures are again noted.  Talar dome appears grossly intact.  Prominent calcaneal spurs.  IMPRESSION: Interval fiberglass splinting of a trimalleolar ankle fracture dislocation, not significantly changed in appearance.   Original Report Authenticated By: Leanna Battles, M.D.   Dg Ankle Complete Left  07/23/2012   *RADIOLOGY REPORT*  Clinical Data: Ankle pain post fall  LEFT ANKLE COMPLETE - 3+ VIEW  Comparison: None  Findings: Trimalleolar fracture-dislocation left ankle. Oblique lateral malleolar fracture displaced laterally. Transverse medial malleolar fracture displaced laterally. Displaced posterior tibial fracture, intra-articular. Lateral and posterior dislocation of talus at ankle joint. Minimal widening of talonavicular joint. Large plantar calcaneal spur. Associated soft tissue swelling deformity. No additional fracture or dislocation  identified.  IMPRESSION: Displaced trimalleolar fracture-dislocation left ankle.   Original Report Authenticated By: Ulyses Southward, M.D.   Dg Ankle Left Port  07/24/2012   *RADIOLOGY REPORT*  Clinical Data: Status post ORIF of ankle fracture.  PORTABLE LEFT ANKLE - 2 VIEW  Comparison: 1 day prior  Findings: Overlying cast obscures bony detail.  AP and lateral views.  Plate and screw fixation of the distal fibula.  Screw fixation of the distal tibia.  Alignment is improved, and is now nearly anatomic.  Small calcaneal spur. No acute hardware complication.  IMPRESSION: Distal tibia and fibular fixation, with improved alignment.   Original Report Authenticated By: Jeronimo Greaves, M.D.   Dg Ankle Left Port  07/23/2012   *RADIOLOGY REPORT*  Clinical Data: Post reduction attempt  PORTABLE LEFT ANKLE - 2 VIEW  Comparison: Earlier same day (multiple examinations)  Findings:  Evaluation of fine bony detail is degraded secondary to overlying casting material.  Grossly unchanged appearance of trimalleolar fracture dislocation, specifically, there is persistent anterior dislocation of the distal tibia in relation to the talar dome.  No radiopaque foreign body.  Plantar calcaneal spur.  IMPRESSION: No significant change in alignment of previously identified displaced trimalleolar fracture / dislocation.   Original Report Authenticated By: Tacey Ruiz, MD    Disposition: 01-Home or Self Care      Discharge Orders   Future Appointments Provider Department Dept Phone   09/29/2012 11:00 AM Romero Belling, MD [facility] [phone]   Future Orders Complete By Expires     Call MD / Call 911  As directed     Comments:      If you experience chest pain or shortness of breath, CALL 911 and be transported to the hospital emergency room.  If you develope a fever above 101 F, pus (white drainage) or increased drainage or redness at the wound, or calf pain, call your surgeon's office.    Constipation  Prevention  As directed     Comments:      Drink plenty of fluids.  Prune juice may be helpful.  You may use a stool softener, such as Colace (over the counter) 100 mg twice a day.  Use MiraLax (over the counter) for constipation as needed.    Diet - low sodium heart healthy  As directed     Discharge instructions  As directed     Comments:      Keep short leg splint and dressing dry. May  use water impervious bag or cast bag and tub chair to shower Tape the top of bag to skin to avoid moisture soaking the dressing on the leg. Call if there is odor or saturation of dressing or worsening pain not controlled with medications. Call if fever greater than 101.5. Use crutches or walker no weight bearing on the ankle fracture leg. Please follow up with an appointment with Dr. Otelia Sergeant  2 weeks from the time of surgery. Elevate as often as possible during the first week after surgery gradually increasing the time the leg is dependent or down there after. If swelling recurrs then elevate again. Wheel chair for longer distances. Take aspirin twice daily for anticoagulation    Increase activity slowly as tolerated  As directed     Non weight bearing  As directed     Scheduling Instructions:      Operative extremity       Follow-up Information   Follow up with NITKA,JAMES E, MD In 2 weeks.   Contact information:   [address] [place] [address] [phone]        Signed: Wende Neighbors 07/28/2012, 8:42 AM

## 2012-07-28 NOTE — Progress Notes (Signed)
Patient discharged in stable condition via wheelchair to SNF. 

## 2012-07-29 NOTE — Discharge Summary (Signed)
Patient d/c summary reviewed with Dondra Spry.

## 2012-08-02 ENCOUNTER — Non-Acute Institutional Stay (SKILLED_NURSING_FACILITY): Payer: Medicare Other | Admitting: Nurse Practitioner

## 2012-08-02 ENCOUNTER — Other Ambulatory Visit: Payer: Self-pay | Admitting: Geriatric Medicine

## 2012-08-02 ENCOUNTER — Encounter: Payer: Self-pay | Admitting: Nurse Practitioner

## 2012-08-02 DIAGNOSIS — E119 Type 2 diabetes mellitus without complications: Secondary | ICD-10-CM

## 2012-08-02 DIAGNOSIS — K219 Gastro-esophageal reflux disease without esophagitis: Secondary | ICD-10-CM

## 2012-08-02 DIAGNOSIS — S82852S Displaced trimalleolar fracture of left lower leg, sequela: Secondary | ICD-10-CM

## 2012-08-02 DIAGNOSIS — S8290XS Unspecified fracture of unspecified lower leg, sequela: Secondary | ICD-10-CM

## 2012-08-02 DIAGNOSIS — I10 Essential (primary) hypertension: Secondary | ICD-10-CM

## 2012-08-02 MED ORDER — HYDROCODONE-ACETAMINOPHEN 5-325 MG PO TABS: 1.0000 | ORAL_TABLET | ORAL | Status: DC | PRN

## 2012-08-02 NOTE — Progress Notes (Deleted)
Patient ID: Yvonne Reynolds, female   DOB: 06/06/1945, 67 y.o.   MRN: [id number]  Nursing Home Location:  [facility]   Place of Service: SNF (31)   Chief Complaint: hospital follow up   HPI:  Hospital Course: Yvonne Reynolds is an 67 y.o. female who was admitted 07/23/2012 with a diagnosis of Displaced trimalleolar fracture of left lower leg and went to the operating room on 07/23/2012 and underwent OPEN REDUCTION INTERNAL FIXATION (ORIF) ANKLE FRACTURE due to slow progression she was discharged to SNF for continuing rehab. Pt is currently doing well with therapy. Pt follows with Dr Romero Belling for diabetic management. She reports recently her NPH was increase however due to dropping in blood sugar she decreased the dose and was taking 50 units twice daily.  Pt reports she has ongoing pain however does not wish to take percocet because it makes her blood pressure go up and therefore she has headaches.   Review of Systems:  *** (delete stars and type .ros)  Medications: Patient's Medications  New Prescriptions   No medications on file  Previous Medications   ASPIRIN EC 325 MG TABLET    Take 1 tablet (325 mg total) by mouth 2 (two) times daily.   CALCIUM CARBONATE (OS-CAL) 600 MG TABS    Take 1,200 mg by mouth daily.    CITALOPRAM (CELEXA) 20 MG TABLET    Take 1 tablet (20 mg total) by mouth 2 (two) times daily.   DOCUSATE SODIUM 100 MG CAPS    Take 100 mg by mouth 2 (two) times daily.   GLUCOSE BLOOD (ONE TOUCH ULTRA TEST) TEST STRIP    Two times a day dx 250.01    HYDROCODONE-ACETAMINOPHEN (NORCO/VICODIN) 5-325 MG PER TABLET    Take 1 tablet by mouth every 4 (four) hours as needed for pain.   INSULIN ASPART (NOVOLOG) 100 UNIT/ML INJECTION    Inject 10 Units into the skin 3 (three) times daily with meals.   INSULIN GLARGINE (LANTUS) 100 UNIT/ML INJECTION    Inject 0.6 mLs (60 Units total) into the skin at bedtime.   LISINOPRIL (PRINIVIL,ZESTRIL) 20 MG TABLET    Take 1  tablet (20 mg total) by mouth daily.   OMEPRAZOLE-SODIUM BICARBONATE (ZEGERID) 40-1100 MG PER CAPSULE    Take 1 capsule by mouth 2 (two) times daily. For heartburn   OXYCODONE-ACETAMINOPHEN (PERCOCET/ROXICET) 5-325 MG PER TABLET    Take 1-2 tablets by mouth every 6 (six) hours as needed for pain.   OXYCODONE-ACETAMINOPHEN (PERCOCET/ROXICET) 5-325 MG PER TABLET    Take 1-2 tablets by mouth every 4 (four) hours as needed.   RELION INSULIN SYR 0.5CC/29G 29G X 1/2" 0.5 ML MISC    USE AS DIRECTED   SIMVASTATIN (ZOCOR) 80 MG TABLET    Take 0.5 tablets (40 mg total) by mouth at bedtime.   VITAMIN D, CHOLECALCIFEROL, 400 UNITS TABLET    Take 400 Units by mouth daily.   VITAMIN E (VITAMIN E) 400 UNIT CAPSULE    Take 400 Units by mouth daily.  Modified Medications   No medications on file  Discontinued Medications   No medications on file     Physical Exam:  Filed Vitals:   08/02/12 1709  BP: 134/64  Pulse: 81  Temp: 98.4 F (36.9 C)  Resp: 16    *** (delete stars and type .physexam)   Labs reviewed: @.lastlabg[63m]   Significant Diagnostic Results:     Assessment/Plan No problem-specific assessment & plan  notes found for this encounter.     Labs/tests ordered

## 2012-08-02 NOTE — Progress Notes (Signed)
Patient ID: Yvonne Reynolds, female   DOB: Dec 14, 1945, 67 y.o.   MRN: [id number]   PCP: Romero Belling, MD   Allergies  Allergen Reactions  . Procaine Hcl     REACTION: hallucinations    Chief Complaint: hospital follow up  HPI:  Yvonne Reynolds is an 67 y.o. female who was admitted 07/23/2012 with a diagnosis of Displaced trimalleolar fracture of left lower leg and went to the operating room on 07/23/2012 and underwent OPEN REDUCTION INTERNAL FIXATION (ORIF) ANKLE FRACTURE due to slow progression she was discharged to SNF for continuing rehab. Pt is currently doing well with therapy. Pt follows with Dr Romero Belling for diabetic management. She reports recently her NPH was  Recently increase however due to dropping in blood sugar she decreased the dose and was taking 50 units twice daily.  Pt reports she has ongoing pain however does not wish to take percocet because it makes her blood pressure go up and therefore she has headaches, would like to have something different for pain  Review of Systems:   Review of Systems  Constitutional: Negative for fever and chills.  HENT: Negative for ear pain, nosebleeds, congestion and sore throat.   Eyes: Negative.   Respiratory: Negative for cough, sputum production and shortness of breath.   Cardiovascular: Negative for chest pain, palpitations and leg swelling.  Gastrointestinal: Negative for abdominal pain, diarrhea and constipation.  Genitourinary: Negative for dysuria, urgency and frequency.  Musculoskeletal: Positive for joint pain. Negative for myalgias.  Skin: Negative for itching and rash.  Neurological: Positive for headaches. Negative for dizziness.     Past Medical History  Diagnosis Date  . 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 DIABETES MELLITUS, TYPE II 12/21/2006  . DIABETIC  RETINOPATHY 12/22/2006  . HYPERLIPIDEMIA 12/22/2006  . DEPRESSION 06/26/2006  .  OBSTRUCTIVE SLEEP APNEA 12/22/2006    no CPAP  . HYPERTENSION 06/26/2006  . ALLERGIC RHINITIS 12/22/2006  . CHRONIC OBSTRUCTIVE PULMONARY DISEASE, ACUTE EXACERBATION 08/24/2007  . Reflux esophagitis 12/22/2006  . GERD 06/26/2006    with EE  . BARRETTS ESOPHAGUS 12/22/2006    without dysplasia  . HIATAL HERNIA 12/22/2006  . OSTEOARTHRITIS 06/26/2006  . OSTEOPOROSIS 06/26/2006   Past Surgical History  Procedure Laterality Date  . Abdominal hysterectomy    . Esophagogastroduodenoscopy  05/07/2004  . Cesarean section    . Wrist surgery      s/p left  after fall  . Incontinence surgery      s/p  . Orif ankle fracture Left 07/23/2012    Procedure: OPEN REDUCTION INTERNAL FIXATION (ORIF) ANKLE FRACTURE;  Surgeon: Kerrin Champagne, MD;  Location: [facility] OR;  Service: Orthopedics;  Laterality: Left;   Social History:   reports that she quit smoking about 8 months ago. Her smoking use included Cigarettes. She smoked 1.00 pack per day. She does not have any smokeless tobacco history on file. She reports that she does not drink alcohol or use illicit drugs.  Family History  Problem Relation Age of Onset  . Cancer Mother     Ovarian Cancer  . Stroke Father   . Diabetes Sister   . Stroke Sister   . Diabetes Brother   . Diabetes Sister   . Hypertension Other   . Hyperlipidemia Other     Medications: Patient's Medications  New Prescriptions   No medications on file  Previous Medications   ASPIRIN EC 325 MG TABLET  Take 1 tablet (325 mg total) by mouth 2 (two) times daily.   CALCIUM CARBONATE (OS-CAL) 600 MG TABS    Take 1,200 mg by mouth daily.    CITALOPRAM (CELEXA) 20 MG TABLET    Take 1 tablet (20 mg total) by mouth 2 (two) times daily.   DOCUSATE SODIUM 100 MG CAPS    Take 100 mg by mouth 2 (two) times daily.   GLUCOSE BLOOD (ONE TOUCH ULTRA TEST) TEST STRIP    Two times a day dx 250.01    HYDROCODONE-ACETAMINOPHEN (NORCO/VICODIN) 5-325 MG PER TABLET    Take 1 tablet by mouth every 4  (four) hours as needed for pain.   INSULIN ASPART (NOVOLOG) 100 UNIT/ML INJECTION    Inject 10 Units into the skin 3 (three) times daily with meals.   INSULIN GLARGINE (LANTUS) 100 UNIT/ML INJECTION    Inject 0.6 mLs (60 Units total) into the skin at bedtime.   LISINOPRIL (PRINIVIL,ZESTRIL) 20 MG TABLET    Take 1 tablet (20 mg total) by mouth daily.   OMEPRAZOLE-SODIUM BICARBONATE (ZEGERID) 40-1100 MG PER CAPSULE    Take 1 capsule by mouth 2 (two) times daily. For heartburn   OXYCODONE-ACETAMINOPHEN (PERCOCET/ROXICET) 5-325 MG PER TABLET    Take 1-2 tablets by mouth every 6 (six) hours as needed for pain.   OXYCODONE-ACETAMINOPHEN (PERCOCET/ROXICET) 5-325 MG PER TABLET    Take 1-2 tablets by mouth every 4 (four) hours as needed.   RELION INSULIN SYR 0.5CC/29G 29G X 1/2" 0.5 ML MISC    USE AS DIRECTED   SIMVASTATIN (ZOCOR) 80 MG TABLET    Take 0.5 tablets (40 mg total) by mouth at bedtime.   VITAMIN D, CHOLECALCIFEROL, 400 UNITS TABLET    Take 400 Units by mouth daily.   VITAMIN E (VITAMIN E) 400 UNIT CAPSULE    Take 400 Units by mouth daily.  Modified Medications   No medications on file  Discontinued Medications   No medications on file     Physical Exam:  Filed Vitals:   08/02/12 1709  BP: 134/64  Pulse: 81  Temp: 98.4 F (36.9 C)  Resp: 16   Physical Exam  Constitutional: She is oriented to person, place, and time and well-developed, well-nourished, and in no distress. No distress.  HENT:  Head: Normocephalic and atraumatic.  Nose: Nose normal.  Mouth/Throat: Oropharynx is clear and moist. No oropharyngeal exudate.  Neck: Normal range of motion. Neck supple. No tracheal deviation present. No thyromegaly present.  Cardiovascular: Normal rate, regular rhythm and normal heart sounds.   Pulmonary/Chest: Effort normal and breath sounds normal. No respiratory distress. She has no wheezes.  Abdominal: Soft. Bowel sounds are normal. She exhibits no distension.  Musculoskeletal:  Normal range of motion. She exhibits no edema and no tenderness.  Self propels in WC  Lymphadenopathy:    She has no cervical adenopathy.  Neurological: She is alert and oriented to person, place, and time.  Skin: Skin is warm and dry. She is not diaphoretic. No cyanosis. Nails show no clubbing.  Psychiatric: Affect normal.      Labs reviewed: Basic Metabolic Panel:  Recent Labs  16/10/96 1142 07/23/12 1154  NA 137 136  K 4.7 4.7  CL 105 101  CO2 25 24  GLUCOSE 180* 231*  BUN 18 27*  CREATININE 0.8 1.02  CALCIUM 8.9 8.8   Liver Function Tests:  Recent Labs  06/29/12 1142  AST 15  ALT 15  ALKPHOS 83  BILITOT 0.3  PROT 7.1  ALBUMIN 3.3*   No results found for this basename: LIPASE, AMYLASE,  in the last 8760 hours No results found for this basename: AMMONIA,  in the last 8760 hours CBC:  Recent Labs  06/29/12 1142 07/23/12 1154  WBC 10.6* 17.6*  NEUTROABS 6.8 14.4*  HGB 11.2* 10.9*  HCT 34.5* 33.4*  MCV 82.4 80.3  PLT 331.0 360   Cardiac Enzymes: No results found for this basename: CKTOTAL, CKMB, CKMBINDEX, TROPONINI,  in the last 8760 hours BNP: No components found with this basename: POCBNP,  CBG:  Recent Labs  07/27/12 2047 07/28/12 0644 07/28/12 1124  GLUCAP 177* 154* 187*    Radiological Exams: 07/23/2012 CHEST - 2 VIEW Comparison:. IMPRESSION: COPD changes with mild stable chronic interstitial prominence. No acute abnormalities. Original Report Authenticated By: Ulyses Southward, M.D.  07/23/2012 . LEFT ANKLE COMPLETE - 3+ VIEW . IMPRESSION: 1. Distal tibia remains anteriorly dislocated by 1.9 cm with respect to the talar dome. Trimalleolar fracture observed. Original Report Authenticated By: Gaylyn Rong, M.D.  07/23/2012 LEFT ANKLE COMPLETE - 3+ VIEW IMPRESSION: Interval fiberglass splinting of a trimalleolar ankle fracture dislocation, not significantly changed in appearance. Original Report Authenticated By: Leanna Battles, M.D.  07/23/2012  LEFT ANKLE COMPLETE - 3+ VIEW . IMPRESSION: Displaced trimalleolar fracture-dislocation left ankle. Original Report Authenticated By: Ulyses Southward, M.D.  07/24/2012 . PORTABLE LEFT ANKLE - 2 VIEW . IMPRESSION: Distal tibia and fibular fixation, with improved alignment. Original Report Authenticated By: Jeronimo Greaves, M.D.  07/23/2012 PORTABLE LEFT ANKLE - 2 VIEW . IMPRESSION: No significant change in alignment of previously identified displaced trimalleolar fracture / dislocation. Original Report Authenticated By: Tacey Ruiz, MD  Consultants:  Dr. Romero Belling PCP  Dr. Otelia Sergeant Orthopedic    Assessment/Plan   1.   GERD 530.81   Stable    2.   HYPERTENSION 401.9   Stable    3.   DIABETES MELLITUS, TYPE II 250.00   Will dc lantus and novolog and put her back on her home regime of NPH 45 units twice daily with meal    4.   Displaced trimalleolar fracture of left lower leg, sequela - here at [facility] for short term therapy in hopes to return home. Tolerating therapy and doing well; will stop oxy and start hydrocodone Apap 5-325 1 tablet q 4 hours as needed for pain  Rehab Status: good

## 2012-08-06 NOTE — Progress Notes (Signed)
Date: 08/06/2012  MRN:  [id number] Name:  Yvonne Reynolds Sex:  female Age:  67 y.o. DOB:[birth year]                     Facility/Room;[facility] Level Of Care:[facility] Provider: Dr. Murray Hodgkins   Emergency Contacts: Contact Information   Name Relation Home Work Mobile   Yvonne Reynolds,Yvonne Reynolds  [id number]        Code Status:Full MOST Form:  Allergies: Allergies  Allergen Reactions  . Procaine Hcl     REACTION: hallucinations     Chief Complaint  Patient presents with  . Medical Managment of Chronic Issues    New admit to SNF following hospitalization for displaced trimalleolar fracture left lower leg     HPI: 67 year old female fell this 10:30AM at her home when getting out of the bathtub and slipped twisting her Left ankle with immediate pain and discomfort and inability to walk, managed to crawl to the bedroom and  Call for EMS. Xrays show a left posterolateral ankle fracture dislocation. She has Undergone 3 attempts at Reduction of the fracture dislocation without adequate reduction. She is being admitted 07/23/12, to go to the OR for  ORIF of the left trimalleolar ankle fracture dislocation with plates. OPEN REDUCTION INTERNAL FIXATION (ORIF) ANKLE FRACTURE on 07/23/2012 Dr Otelia Sergeant. Patient was discharged 07/28/2012 to [facility] for rehab.   Past Medical History  Diagnosis Date  . 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 DIABETES MELLITUS, TYPE II 12/21/2006  . DIABETIC  RETINOPATHY 12/22/2006  . HYPERLIPIDEMIA 12/22/2006  . DEPRESSION 06/26/2006  . OBSTRUCTIVE SLEEP APNEA 12/22/2006    no CPAP  . HYPERTENSION 06/26/2006  . ALLERGIC RHINITIS 12/22/2006  . CHRONIC OBSTRUCTIVE PULMONARY DISEASE, ACUTE EXACERBATION 08/24/2007  . Reflux esophagitis 12/22/2006  . GERD 06/26/2006    with EE  . BARRETTS ESOPHAGUS 12/22/2006    without dysplasia  . HIATAL HERNIA 12/22/2006  . OSTEOARTHRITIS 06/26/2006  . OSTEOPOROSIS  06/26/2006    Past Surgical History  Procedure Laterality Date  . Abdominal hysterectomy    . Esophagogastroduodenoscopy  05/07/2004  . Cesarean section    . Wrist surgery      s/p left  after fall  . Incontinence surgery      s/p  . Orif ankle fracture Left 07/23/2012    Procedure: OPEN REDUCTION INTERNAL FIXATION (ORIF) ANKLE FRACTURE;  Surgeon: Kerrin Champagne, MD;  Location: [facility] OR;  Service: Orthopedics;  Laterality: Left;     Procedures: 07/23/2012  CHEST - 2 VIEW Comparison:. IMPRESSION: COPD changes with mild stable chronic interstitial prominence. No acute abnormalities. Original Report Authenticated By: Ulyses Southward, M.D.   07/23/2012 . LEFT ANKLE COMPLETE - 3+ VIEW . IMPRESSION: 1. Distal tibia remains anteriorly dislocated by 1.9 cm with respect to the talar dome. Trimalleolar fracture observed. Original Report Authenticated By: Gaylyn Rong, M.D.   07/23/2012  LEFT ANKLE COMPLETE - 3+ VIEW  IMPRESSION: Interval fiberglass splinting of a trimalleolar ankle fracture dislocation, not significantly changed in appearance. Original Report Authenticated By: Leanna Battles, M.D.   07/23/2012  LEFT ANKLE COMPLETE - 3+ VIEW . IMPRESSION: Displaced trimalleolar fracture-dislocation left ankle. Original Report Authenticated By: Ulyses Southward, M.D.   07/24/2012 . PORTABLE LEFT ANKLE - 2 VIEW . IMPRESSION: Distal tibia and fibular fixation, with improved alignment. Original Report Authenticated By: Jeronimo Greaves, M.D.    07/23/2012  PORTABLE LEFT ANKLE - 2 VIEW . IMPRESSION:  No significant change in alignment of previously identified displaced trimalleolar fracture / dislocation. Original Report Authenticated By: Tacey Ruiz, MD   Consultants:  Dr. Romero Belling PCP  Dr. Otelia Sergeant Orthopedic   Current Outpatient Prescriptions  Medication Sig Dispense Refill  . aspirin EC 325 MG tablet Take 1 tablet (325 mg total) by mouth 2 (two) times daily.  30 tablet  0  . calcium carbonate (OS-CAL) 600 MG TABS  Take 1,200 mg by mouth daily.       . citalopram (CELEXA) 20 MG tablet Take 1 tablet (20 mg total) by mouth 2 (two) times daily.  180 tablet  3  . docusate sodium 100 MG CAPS Take 100 mg by mouth 2 (two) times daily.  10 capsule  0  . glucose blood (ONE TOUCH ULTRA TEST) test strip Two times a day dx 250.01       . HYDROcodone-acetaminophen (NORCO/VICODIN) 5-325 MG per tablet Take 1 tablet by mouth every 4 (four) hours as needed for pain.  180 tablet  5  . insulin aspart (NOVOLOG) 100 UNIT/ML injection Inject 10 Units into the skin 3 (three) times daily with meals.  1 vial  12  . insulin glargine (LANTUS) 100 UNIT/ML injection Inject 0.6 mLs (60 Units total) into the skin at bedtime.  10 mL  12  . lisinopril (PRINIVIL,ZESTRIL) 20 MG tablet Take 1 tablet (20 mg total) by mouth daily.  90 tablet  3  . omeprazole-sodium bicarbonate (ZEGERID) 40-1100 MG per capsule Take 1 capsule by mouth 2 (two) times daily. For heartburn  180 capsule  3  . oxyCODONE-acetaminophen (PERCOCET/ROXICET) 5-325 MG per tablet Take 1-2 tablets by mouth every 6 (six) hours as needed for pain.  30 tablet  0  . oxyCODONE-acetaminophen (PERCOCET/ROXICET) 5-325 MG per tablet Take 1-2 tablets by mouth every 4 (four) hours as needed.  30 tablet  0  . RELION INSULIN SYR 0.5CC/29G 29G X 1/2" 0.5 ML MISC USE AS DIRECTED  100 each  3  . simvastatin (ZOCOR) 80 MG tablet Take 0.5 tablets (40 mg total) by mouth at bedtime.  45 tablet  3  . vitamin D, CHOLECALCIFEROL, 400 UNITS tablet Take 400 Units by mouth daily.      . vitamin E (VITAMIN E) 400 UNIT capsule Take 400 Units by mouth daily.      . [DISCONTINUED] oxybutynin (DITROPAN) 5 MG tablet Take 5 mg by mouth 2 (two) times daily.         No current facility-administered medications for this visit.    Immunization History  Administered Date(s) Administered  . Influenza Split 02/05/2011  . Influenza Whole 11/28/2008  . Pneumococcal Polysaccharide 06/24/2011  . Td 11/15/2002      Diet:  History  Substance Use Topics  . Smoking status: Former Smoker -- 1.00 packs/day    Types: Cigarettes    Quit date: 11/11/2011  . Smokeless tobacco: Not on file  . Alcohol Use: No    Family History  Problem Relation Age of Onset  . Cancer Mother     Ovarian Cancer  . Stroke Father   . Diabetes Sister   . Stroke Sister   . Diabetes Brother   . Diabetes Sister   . Hypertension Other   . Hyperlipidemia Other        Vital signs: BP 134/64  Pulse 81  Resp 16  Ht 5\' 2"  (1.575 m)  Wt 177 lb 9.6 oz (80.559 kg)  BMI 32.48 kg/m2   General  Appearance:    Alert, cooperative, no distress, appears stated age  Head:    Normocephalic, without obvious abnormality, atraumatic  Eyes:    PERRL, conjunctiva/corneas clear, EOM's intact, fundi    benign, both eyes  Ears:    Normal TM's and external ear canals, both ears  Nose:   Nares normal, septum midline, mucosa normal, no drainage    or sinus tenderness  Throat:   Lips, mucosa, and tongue normal; teeth and gums normal  Neck:   Supple, symmetrical, trachea midline, no adenopathy;    thyroid:  no enlargement/tenderness/nodules; no carotid   bruit or JVD  Back:     Symmetric, no curvature, ROM normal, no CVA tenderness  Lungs:     Clear to auscultation bilaterally, respirations unlabored  Chest Wall:    No tenderness or deformity   Heart:    Regular rate and rhythm, S1 and S2 normal, no murmur, rub   or gallop  Breast Exam:    No tenderness, masses, or nipple abnormality  Abdomen:     Soft, non-tender, bowel sounds active all four quadrants,    no masses, no organomegaly  Genitalia:    Normal female without lesion, discharge or tenderness  Rectal:    Normal tone, normal prostate, no masses or tenderness;   guaiac negative stool  Extremities:   Extremities normal, atraumatic, no cyanosis or edema  Pulses:   2+ and symmetric all extremities  Skin:   Skin color, texture, turgor normal, no rashes or lesions  Lymph  nodes:   Cervical, supraclavicular, and axillary nodes normal  Neurologic:   CNII-XII intact, normal strength, sensation and reflexes    throughout    Screening Score  MMS    PHQ2    PHQ9     Fall Risk    BIMS     Admission on 07/23/2012, Discharged on 07/28/2012  Component Date Value Range Status  . WBC 07/23/2012 17.6* 4.0 - 10.5 K/uL Final  . RBC 07/23/2012 4.16  3.87 - 5.11 MIL/uL Final  . Hemoglobin 07/23/2012 10.9* 12.0 - 15.0 g/dL Final  . HCT 72/53/6644 33.4* 36.0 - 46.0 % Final  . MCV 07/23/2012 80.3  78.0 - 100.0 fL Final  . MCH 07/23/2012 26.2  26.0 - 34.0 pg Final  . MCHC 07/23/2012 32.6  30.0 - 36.0 g/dL Final  . RDW 03/47/4259 14.1  11.5 - 15.5 % Final  . Platelets 07/23/2012 360  150 - 400 K/uL Final  . Neutrophils Relative % 07/23/2012 82* 43 - 77 % Final  . Neutro Abs 07/23/2012 14.4* 1.7 - 7.7 K/uL Final  . Lymphocytes Relative 07/23/2012 12  12 - 46 % Final  . Lymphs Abs 07/23/2012 2.0  0.7 - 4.0 K/uL Final  . Monocytes Relative 07/23/2012 6  3 - 12 % Final  . Monocytes Absolute 07/23/2012 1.0  0.1 - 1.0 K/uL Final  . Eosinophils Relative 07/23/2012 1  0 - 5 % Final  . Eosinophils Absolute 07/23/2012 0.2  0.0 - 0.7 K/uL Final  . Basophils Relative 07/23/2012 0  0 - 1 % Final  . Basophils Absolute 07/23/2012 0.0  0.0 - 0.1 K/uL Final  . Sodium 07/23/2012 136  135 - 145 mEq/L Final  . Potassium 07/23/2012 4.7  3.5 - 5.1 mEq/L Final  . Chloride 07/23/2012 101  96 - 112 mEq/L Final  . CO2 07/23/2012 24  19 - 32 mEq/L Final  . Glucose, Bld 07/23/2012 231* 70 - 99 mg/dL Final  . BUN  07/23/2012 27* 6 - 23 mg/dL Final  . Creatinine, Ser 07/23/2012 1.02  0.50 - 1.10 mg/dL Final  . Calcium 21/30/8657 8.8  8.4 - 10.5 mg/dL Final  . GFR calc non Af Amer 07/23/2012 56* >90 mL/min Final  . GFR calc Af Amer 07/23/2012 65* >90 mL/min Final   Comment:                                 The eGFR has been calculated                          using the CKD EPI equation.                           This calculation has not been                          validated in all clinical                          situations.                          eGFR's persistently                          <90 mL/min signify                          possible Chronic Kidney Disease.  . Glucose-Capillary 07/23/2012 186* 70 - 99 mg/dL Final  . Glucose-Capillary 07/23/2012 196* 70 - 99 mg/dL Final  . Glucose-Capillary 07/23/2012 224* 70 - 99 mg/dL Final  . Hemoglobin Q4O 07/24/2012 8.5* <5.7 % Final   Comment: (NOTE)                                                                                                                         According to the ADA Clinical Practice Recommendations for 2011, when                          HbA1c is used as a screening test:                           >=6.5%   Diagnostic of Diabetes Mellitus                                    (if abnormal result is confirmed)                          5.7-6.4%   Increased  risk of developing Diabetes Mellitus                          References:Diagnosis and Classification of Diabetes Mellitus,Diabetes                          Care,2011,34(Suppl 1):S62-S69 and Standards of Medical Care in                                  Diabetes - 2011,Diabetes Care,2011,34 (Suppl 1):S11-S61.  . Mean Plasma Glucose 07/24/2012 197* <117 mg/dL Final  . Glucose-Capillary 07/24/2012 288* 70 - 99 mg/dL Final  . Glucose-Capillary 07/24/2012 249* 70 - 99 mg/dL Final  . Glucose-Capillary 07/24/2012 179* 70 - 99 mg/dL Final  . Hemoglobin W4X 07/24/2012 8.4* <5.7 % Final   Comment: (NOTE)                                                                                                                         According to the ADA Clinical Practice Recommendations for 2011, when                          HbA1c is used as a screening test:                           >=6.5%   Diagnostic of Diabetes Mellitus                                    (if abnormal  result is confirmed)                          5.7-6.4%   Increased risk of developing Diabetes Mellitus                          References:Diagnosis and Classification of Diabetes Mellitus,Diabetes                          Care,2011,34(Suppl 1):S62-S69 and Standards of Medical Care in                                  Diabetes - 2011,Diabetes Care,2011,34 (Suppl 1):S11-S61.  . Mean Plasma Glucose 07/24/2012 194* <117 mg/dL Final  . Glucose-Capillary 07/24/2012 179* 70 - 99 mg/dL Final  . Comment 1 32/44/0102 Notify RN   Final  . Comment 2 07/24/2012 Documented in Chart   Final  . Glucose-Capillary 07/25/2012 232* 70 - 99 mg/dL Final  . Comment 1 72/53/6644 Notify RN   Final  . Comment 2 07/25/2012  Documented in Chart   Final  . Glucose-Capillary 07/25/2012 70  70 - 99 mg/dL Final  . Comment 1 40/98/1191 Notify RN   Final  . Comment 2 07/25/2012 Documented in Chart   Final  . Glucose-Capillary 07/25/2012 177* 70 - 99 mg/dL Final  . Comment 1 47/82/9562 Notify RN   Final  . Comment 2 07/25/2012 Documented in Chart   Final  . Glucose-Capillary 07/25/2012 163* 70 - 99 mg/dL Final  . Glucose-Capillary 07/26/2012 226* 70 - 99 mg/dL Final  . Glucose-Capillary 07/26/2012 165* 70 - 99 mg/dL Final  . Comment 1 13/08/6576 Notify RN   Final  . Glucose-Capillary 07/26/2012 189* 70 - 99 mg/dL Final  . Comment 1 46/96/2952 Notify RN   Final  . Glucose-Capillary 07/26/2012 203* 70 - 99 mg/dL Final  . Glucose-Capillary 07/27/2012 194* 70 - 99 mg/dL Final  . Glucose-Capillary 07/27/2012 180* 70 - 99 mg/dL Final  . Comment 1 84/13/2440 Notify RN   Final  . Glucose-Capillary 07/27/2012 213* 70 - 99 mg/dL Final  . Comment 1 11/23/2534 Notify RN   Final  . Glucose-Capillary 07/27/2012 177* 70 - 99 mg/dL Final  . Glucose-Capillary 07/28/2012 154* 70 - 99 mg/dL Final  . Glucose-Capillary 07/28/2012 187* 70 - 99 mg/dL Final  . Comment 1 64/40/3474 Notify RN   Final     Annual  summary: Hospitalizations: Infection History:  Functional assessment: Areas of potential improvement: Rehabilitation Potential: Prognosis for survival: Plan:

## 2012-08-11 DIAGNOSIS — E11319 Type 2 diabetes mellitus with unspecified diabetic retinopathy without macular edema: Secondary | ICD-10-CM

## 2012-08-11 DIAGNOSIS — E1139 Type 2 diabetes mellitus with other diabetic ophthalmic complication: Secondary | ICD-10-CM

## 2012-08-11 DIAGNOSIS — I10 Essential (primary) hypertension: Secondary | ICD-10-CM

## 2012-08-11 DIAGNOSIS — S8290XD Unspecified fracture of unspecified lower leg, subsequent encounter for closed fracture with routine healing: Secondary | ICD-10-CM

## 2012-08-16 ENCOUNTER — Ambulatory Visit: Payer: Medicare Other | Admitting: Endocrinology

## 2012-08-27 ENCOUNTER — Telehealth: Payer: Self-pay | Admitting: Geriatric Medicine

## 2012-08-27 NOTE — Telephone Encounter (Signed)
Her blood pressure has been running over 160 consistently. She takes Lisinopril 20mg  daily. The home health nurse called and asked if the dose should be increased?

## 2012-08-28 NOTE — Telephone Encounter (Signed)
Mrs Jernberg was under our care while a patient at [facility]. If she has returned home, call should be directed to her primary care physician. I believe this is Dr. Romero Belling.

## 2012-09-01 ENCOUNTER — Telehealth: Payer: Self-pay

## 2012-09-01 NOTE — Telephone Encounter (Signed)
Diannia Ruder advised and states an [place]

## 2012-09-01 NOTE — Telephone Encounter (Signed)
Diannia Ruder  With [facility] called stating that pt's bp has been high in the 160's in the evening, pt currently takes Lisinopril 20mg  qd please advise [phone]

## 2012-09-01 NOTE — Telephone Encounter (Signed)
Increase lisinopril to 40 mg

## 2012-09-17 ENCOUNTER — Telehealth: Payer: Self-pay

## 2012-09-17 NOTE — Telephone Encounter (Signed)
Pt left voice mail stating she is sick on the stomach, vomitting from the abx that she was taking, please advise [phone]

## 2012-09-17 NOTE — Telephone Encounter (Signed)
Ov tomorrow at [employer]

## 2012-09-17 NOTE — Telephone Encounter (Signed)
Pt advised.

## 2012-09-18 ENCOUNTER — Encounter: Payer: Self-pay | Admitting: Internal Medicine

## 2012-09-18 ENCOUNTER — Ambulatory Visit (INDEPENDENT_AMBULATORY_CARE_PROVIDER_SITE_OTHER): Payer: Medicare Other | Admitting: Internal Medicine

## 2012-09-18 VITALS — BP 102/68 | Temp 98.4°F | Wt 158.0 lb

## 2012-09-18 DIAGNOSIS — R110 Nausea: Secondary | ICD-10-CM

## 2012-09-18 DIAGNOSIS — R112 Nausea with vomiting, unspecified: Secondary | ICD-10-CM

## 2012-09-18 MED ORDER — SUCRALFATE 1 GM/10ML PO SUSP: 1.0000 g | Freq: Three times a day (TID) | ORAL | Status: DC

## 2012-09-18 NOTE — Progress Notes (Signed)
Subjective:    Patient ID: Yvonne Reynolds, female    DOB: 09-27-1945, 67 y.o.   MRN: [id number]  HPI Has been sick since recent antibiotic use Has raw feeling in her esophagus Hungry but then will throw up  Had some hematemesis a week ago On zegerid bid ---not clearly helping  Checking sugars at least once a day 174 this AM even though she isn't eating In low 200's in evening No hypoglycemia  No diarrhea No clear cut fever  Current Outpatient Prescriptions on File Prior to Visit  Medication Sig Dispense Refill  . aspirin EC 325 MG tablet Take 1 tablet (325 mg total) by mouth 2 (two) times daily.  30 tablet  0  . calcium carbonate (OS-CAL) 600 MG TABS Take 1,200 mg by mouth daily.       . citalopram (CELEXA) 20 MG tablet Take 1 tablet (20 mg total) by mouth 2 (two) times daily.  180 tablet  3  . docusate sodium 100 MG CAPS Take 100 mg by mouth 2 (two) times daily.  10 capsule  0  . glucose blood (ONE TOUCH ULTRA TEST) test strip Two times a day dx 250.01       . HYDROcodone-acetaminophen (NORCO/VICODIN) 5-325 MG per tablet Take 1 tablet by mouth every 4 (four) hours as needed for pain.  180 tablet  5  . insulin aspart (NOVOLOG) 100 UNIT/ML injection Inject 10 Units into the skin 3 (three) times daily with meals.  1 vial  12  . insulin glargine (LANTUS) 100 UNIT/ML injection Inject 0.6 mLs (60 Units total) into the skin at bedtime.  10 mL  12  . lisinopril (PRINIVIL,ZESTRIL) 20 MG tablet Take 1 tablet (20 mg total) by mouth daily.  90 tablet  3  . omeprazole-sodium bicarbonate (ZEGERID) 40-1100 MG per capsule Take 1 capsule by mouth 2 (two) times daily. For heartburn  180 capsule  3  . oxyCODONE-acetaminophen (PERCOCET/ROXICET) 5-325 MG per tablet Take 1-2 tablets by mouth every 6 (six) hours as needed for pain.  30 tablet  0  . oxyCODONE-acetaminophen (PERCOCET/ROXICET) 5-325 MG per tablet Take 1-2 tablets by mouth every 4 (four) hours as needed.  30 tablet  0  . RELION INSULIN SYR  0.5CC/29G 29G X 1/2" 0.5 ML MISC USE AS DIRECTED  100 each  3  . simvastatin (ZOCOR) 80 MG tablet Take 0.5 tablets (40 mg total) by mouth at bedtime.  45 tablet  3  . vitamin D, CHOLECALCIFEROL, 400 UNITS tablet Take 400 Units by mouth daily.      . vitamin E (VITAMIN E) 400 UNIT capsule Take 400 Units by mouth daily.      . [DISCONTINUED] oxybutynin (DITROPAN) 5 MG tablet Take 5 mg by mouth 2 (two) times daily.         No current facility-administered medications on file prior to visit.    Allergies  Allergen Reactions  . Procaine Hcl     REACTION: hallucinations    Past Medical History  Diagnosis Date  . 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 DIABETES MELLITUS, TYPE II 12/21/2006  . DIABETIC  RETINOPATHY 12/22/2006  . HYPERLIPIDEMIA 12/22/2006  . DEPRESSION 06/26/2006  . OBSTRUCTIVE SLEEP APNEA 12/22/2006    no CPAP  . HYPERTENSION 06/26/2006  . ALLERGIC RHINITIS 12/22/2006  . CHRONIC OBSTRUCTIVE PULMONARY DISEASE, ACUTE EXACERBATION 08/24/2007  . Reflux esophagitis 12/22/2006  . GERD 06/26/2006    with EE  .  BARRETTS ESOPHAGUS 12/22/2006    without dysplasia  . HIATAL HERNIA 12/22/2006  . OSTEOARTHRITIS 06/26/2006  . OSTEOPOROSIS 06/26/2006    Past Surgical History  Procedure Laterality Date  . Abdominal hysterectomy    . Esophagogastroduodenoscopy  05/07/2004  . Cesarean section    . Wrist surgery      s/p left  after fall  . Incontinence surgery      s/p  . Orif ankle fracture Left 07/23/2012    Procedure: OPEN REDUCTION INTERNAL FIXATION (ORIF) ANKLE FRACTURE;  Surgeon: Kerrin Champagne, MD;  Location: [facility] OR;  Service: Orthopedics;  Laterality: Left;    Family History  Problem Relation Age of Onset  . Cancer Mother     Ovarian Cancer  . Stroke Father   . Diabetes Sister   . Stroke Sister   . Diabetes Brother   . Diabetes Sister   . Hypertension Other   . Hyperlipidemia Other     History    Social History  . Marital Status: Widowed    Spouse Name: N/A    Number of Children: N/A  . Years of Education: N/A   Occupational History  . 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 Smoking status: Former Smoker -- 1.00 packs/day    Types: Cigarettes    Quit date: 11/11/2011  . Smokeless tobacco: Not on file  . Alcohol Use: No  . Drug Use: No  . Sexual Activity: Not on file   Other Topics Concern  . Not on file   Social History Narrative   Widowed   Daily Caffeine Use-2 cups daily or more   Pt does not get regular exercise   Works [employer], 3rd shift   Review of Systems Lost about 20# in rehab from ankle fracture Leg edema is better on the right but persists on the left    Objective:   Physical Exam  Constitutional: She appears well-developed. No distress.  Pulmonary/Chest: Effort normal and breath sounds normal. No respiratory distress. She has no wheezes. She has no rales.  Abdominal: Soft. She exhibits no distension. There is no tenderness. There is no rebound and no guarding.  Musculoskeletal: She exhibits no edema.  Left ankle is thicker than right but no edema          Assessment & Plan:

## 2012-09-18 NOTE — Addendum Note (Signed)
Addended by: Azucena Freed on: 09/18/2012 12:04 PM   Modules accepted: Orders

## 2012-09-18 NOTE — Assessment & Plan Note (Signed)
Since recent antibiotic use No diarrhea She relates to her Barrett's but vomits after eating  Will have her hold the aspirin Continue zegerid Add sucralfate

## 2012-09-29 ENCOUNTER — Ambulatory Visit: Payer: Medicare Other | Admitting: Endocrinology

## 2012-10-11 ENCOUNTER — Ambulatory Visit: Payer: Medicare Other | Admitting: Internal Medicine

## 2012-10-11 ENCOUNTER — Encounter: Payer: Self-pay | Admitting: Endocrinology

## 2012-10-11 ENCOUNTER — Ambulatory Visit (INDEPENDENT_AMBULATORY_CARE_PROVIDER_SITE_OTHER): Payer: Medicare Other | Admitting: *Deleted

## 2012-10-11 ENCOUNTER — Ambulatory Visit (INDEPENDENT_AMBULATORY_CARE_PROVIDER_SITE_OTHER): Payer: Medicare Other | Admitting: Endocrinology

## 2012-10-11 VITALS — BP 130/74 | HR 65 | Ht 62.0 in | Wt 159.0 lb

## 2012-10-11 DIAGNOSIS — J441 Chronic obstructive pulmonary disease with (acute) exacerbation: Secondary | ICD-10-CM

## 2012-10-11 DIAGNOSIS — Z23 Encounter for immunization: Secondary | ICD-10-CM

## 2012-10-11 DIAGNOSIS — E119 Type 2 diabetes mellitus without complications: Secondary | ICD-10-CM

## 2012-10-11 MED ORDER — LISINOPRIL 20 MG PO TABS: 20.0000 mg | ORAL_TABLET | Freq: Every day | ORAL | Status: DC

## 2012-10-11 MED ORDER — TRIAMCINOLONE ACETONIDE 0.1 % EX CREA: TOPICAL_CREAM | Freq: Three times a day (TID) | CUTANEOUS | Status: DC

## 2012-10-11 MED ORDER — INSULIN NPH ISOPHANE & REGULAR (70-30) 100 UNIT/ML ~~LOC~~ SUSP: 50.0000 [IU] | Freq: Every day | SUBCUTANEOUS | Status: DC

## 2012-10-11 NOTE — Addendum Note (Signed)
Addended by: Merrilyn Puma on: 10/11/2012 02:32 PM   Modules accepted: Orders

## 2012-10-11 NOTE — Patient Instructions (Addendum)
check your blood sugar once a day.  vary the time of day when you check, between before the 3 meals, and at bedtime.  also check if you have symptoms of your blood sugar being too high or too low.  please keep a record of the readings and bring it to your next appointment here.  please call us sooner if your blood sugar goes below 70, or if you have a lot of readings over 200.   Please change the NPH to "70/30" insulin, at the same amount.   Please come back for a follow-up appointment in 6 weeks.   Also, please do the breathing test at the old office at 1 PM today.

## 2012-10-11 NOTE — Progress Notes (Signed)
Subjective:    Patient ID: Yvonne Reynolds, female    DOB: 02-01-1945, 67 y.o.   MRN: [id number]  HPI Pt returns for f/u of insulin-requiring DM (dx'ed 1995; she has mild if any neuropathy of the lower extremities, but she has associated retinopathy and nephropathy).  no cbg record, but states cbg's vary from 50-300.  It is in general higher as the day goes on. She has slight doe. Past Medical History  Diagnosis Date  . 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 DIABETES MELLITUS, TYPE II 12/21/2006  . DIABETIC  RETINOPATHY 12/22/2006  . HYPERLIPIDEMIA 12/22/2006  . DEPRESSION 06/26/2006  . OBSTRUCTIVE SLEEP APNEA 12/22/2006    no CPAP  . HYPERTENSION 06/26/2006  . ALLERGIC RHINITIS 12/22/2006  . CHRONIC OBSTRUCTIVE PULMONARY DISEASE, ACUTE EXACERBATION 08/24/2007  . Reflux esophagitis 12/22/2006  . GERD 06/26/2006    with EE  . BARRETTS ESOPHAGUS 12/22/2006    without dysplasia  . HIATAL HERNIA 12/22/2006  . OSTEOARTHRITIS 06/26/2006  . OSTEOPOROSIS 06/26/2006    Past Surgical History  Procedure Laterality Date  . Abdominal hysterectomy    . Esophagogastroduodenoscopy  05/07/2004  . Cesarean section    . Wrist surgery      s/p left  after fall  . Incontinence surgery      s/p  . Orif ankle fracture Left 07/23/2012    Procedure: OPEN REDUCTION INTERNAL FIXATION (ORIF) ANKLE FRACTURE;  Surgeon: Kerrin Champagne, MD;  Location: [facility];  Service: Orthopedics;  Laterality: Left;    History   Social History  . Marital Status: Widowed    Spouse Name: N/A    Number of Children: N/A  . Years of Education: N/A   Occupational History  . 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 Smoking status: Former Smoker -- 1.00 packs/day    Types: Cigarettes    Quit date: 11/11/2011  . Smokeless tobacco: Not on file  . Alcohol Use: No  . Drug Use: No  . Sexual Activity: Not on  file   Other Topics Concern  . Not on file   Social History Narrative   Widowed   Daily Caffeine Use-2 cups daily or more   Pt does not get regular exercise   Works [employer], 3rd shift    Current Outpatient Prescriptions on File Prior to Visit  Medication Sig Dispense Refill  . aspirin 81 MG tablet Take 81 mg by mouth 2 (two) times daily.      . calcium carbonate (OS-CAL) 600 MG TABS Take 1,200 mg by mouth daily.       . citalopram (CELEXA) 20 MG tablet Take 1 tablet (20 mg total) by mouth 2 (two) times daily.  180 tablet  3  . glucose blood (ONE TOUCH ULTRA TEST) test strip Two times a day dx 250.01       . omeprazole-sodium bicarbonate (ZEGERID) 40-1100 MG per capsule Take 1 capsule by mouth 2 (two) times daily. For heartburn  180 capsule  3  . RELION INSULIN SYR 0.5CC/29G 29G X 1/2" 0.5 ML MISC USE AS DIRECTED  100 each  3  . simvastatin (ZOCOR) 80 MG tablet Take 0.5 tablets (40 mg total) by mouth at bedtime.  45 tablet  3  . sucralfate (CARAFATE) 1 GM/10ML suspension Take 10 mLs (1 g total) by mouth 4 (four) times daily -  with meals and  at bedtime.  840 mL  0  . vitamin D, CHOLECALCIFEROL, 400 UNITS tablet Take 400 Units by mouth daily.      . vitamin E (VITAMIN E) 400 UNIT capsule Take 400 Units by mouth daily.      . [DISCONTINUED] oxybutynin (DITROPAN) 5 MG tablet Take 5 mg by mouth 2 (two) times daily.         No current facility-administered medications on file prior to visit.    Allergies  Allergen Reactions  . Procaine Hcl     REACTION: hallucinations    Family History  Problem Relation Age of Onset  . Cancer Mother     Ovarian Cancer  . Stroke Father   . Diabetes Sister   . Stroke Sister   . Diabetes Brother   . Diabetes Sister   . Hypertension Other   . Hyperlipidemia Other    BP 130/74  Pulse 65  Ht 5\' 2"  (1.575 m)  Wt 159 lb (72.122 kg)  BMI 29.07 kg/m2  SpO2 98%  Review of Systems Right ankle swelling is resolved, but it persists on the left.   The left ankle is also itchy.    Objective:   Physical Exam VITAL SIGNS:  See vs page GENERAL: no distress  (i reviewed spirometry results)    Assessment & Plan:  DM: This insulin regimen was chosen from multiple options, for its simplicity.  The benefits of glycemic control must be weighed against the risks of hypoglycemia.  Based on the pattern of her cbg's, she needs a faster and shorter-acting insulin. Doe, uncertain etiology.  She does not have copd. Edema is better, but her persistent left ankle swelling is prob due to her recent surgery. Pruritis, due to the postsurgical swelling.

## 2012-11-01 ENCOUNTER — Other Ambulatory Visit: Payer: Self-pay | Admitting: Obstetrics & Gynecology

## 2012-12-30 ENCOUNTER — Telehealth: Payer: Self-pay | Admitting: *Deleted

## 2012-12-30 NOTE — Telephone Encounter (Signed)
Please see below and advise if ok to send in script for humalog.   Thanks!

## 2012-12-30 NOTE — Telephone Encounter (Signed)
Please refill x 1 Ov is due  

## 2012-12-31 ENCOUNTER — Other Ambulatory Visit: Payer: Self-pay | Admitting: *Deleted

## 2012-12-31 MED ORDER — INSULIN NPH ISOPHANE & REGULAR (70-30) 100 UNIT/ML ~~LOC~~ SUSP: 50.0000 [IU] | Freq: Every day | SUBCUTANEOUS | Status: DC

## 2013-02-18 ENCOUNTER — Encounter: Payer: Self-pay | Admitting: Endocrinology

## 2013-02-18 ENCOUNTER — Ambulatory Visit (INDEPENDENT_AMBULATORY_CARE_PROVIDER_SITE_OTHER): Payer: Commercial Managed Care - HMO | Admitting: Endocrinology

## 2013-02-18 ENCOUNTER — Telehealth: Payer: Self-pay

## 2013-02-18 VITALS — BP 116/60 | HR 98 | Temp 98.0°F | Ht 62.0 in | Wt 155.0 lb

## 2013-02-18 DIAGNOSIS — E119 Type 2 diabetes mellitus without complications: Secondary | ICD-10-CM

## 2013-02-18 LAB — HEMOGLOBIN A1C: Hgb A1c MFr Bld: 9.8 % — ABNORMAL HIGH (ref 4.6–6.5)

## 2013-02-18 MED ORDER — SUCRALFATE 1 G PO TABS: 1.0000 g | ORAL_TABLET | Freq: Three times a day (TID) | ORAL | Status: DC

## 2013-02-18 MED ORDER — LISINOPRIL 10 MG PO TABS: 10.0000 mg | ORAL_TABLET | Freq: Every day | ORAL | Status: DC

## 2013-02-18 MED ORDER — TRIAMCINOLONE ACETONIDE 0.1 % EX CREA: TOPICAL_CREAM | Freq: Three times a day (TID) | CUTANEOUS | Status: DC

## 2013-02-18 MED ORDER — SUCRALFATE 1 GM/10ML PO SUSP: 1.0000 g | Freq: Three times a day (TID) | ORAL | Status: DC

## 2013-02-18 NOTE — Patient Instructions (Addendum)
check your blood sugar once a day.  vary the time of day when you check, between before the 3 meals, and at bedtime.  also check if you have symptoms of your blood sugar being too high or too low.  please keep a record of the readings and bring it to your next appointment here.  please call us sooner if your blood sugar goes below 70, or if you have a lot of readings over 200.   Please come back for a follow-up appointment in 3 months.   If necessary, try purchasing "zegerid" without a prescription.   i have sent a prescription to your pharmacy, for the itching.  Also, i have sent a prescription to your pharmacy, for a reduced amount of the blood-pressure medication.

## 2013-02-18 NOTE — Progress Notes (Signed)
Subjective:    Patient ID: Yvonne Reynolds, female    DOB: [birth year], 68 y.o.   MRN: [id number]  HPI Pt returns for f/u of insulin-requiring DM (dx'ed 1995, when she was noted to have glycosuria; she has mild if any neuropathy of the lower extremities, but she has associated retinopathy and nephropathy; she has been on insulin since approx 1999; she has never had severe hypoglycemia or DKA; she chose a simple and inexpensive bid premixed insulin).  She does not check cbg's.   Past Medical History  Diagnosis Date  . 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 DIABETES MELLITUS, TYPE II 12/21/2006  . DIABETIC  RETINOPATHY 12/22/2006  . HYPERLIPIDEMIA 12/22/2006  . DEPRESSION 06/26/2006  . OBSTRUCTIVE SLEEP APNEA 12/22/2006    no CPAP  . HYPERTENSION 06/26/2006  . ALLERGIC RHINITIS 12/22/2006  . CHRONIC OBSTRUCTIVE PULMONARY DISEASE, ACUTE EXACERBATION 08/24/2007  . Reflux esophagitis 12/22/2006  . GERD 06/26/2006    with EE  . BARRETTS ESOPHAGUS 12/22/2006    without dysplasia  . HIATAL HERNIA 12/22/2006  . OSTEOARTHRITIS 06/26/2006  . OSTEOPOROSIS 06/26/2006    Past Surgical History  Procedure Laterality Date  . Abdominal hysterectomy    . Esophagogastroduodenoscopy  05/07/2004  . Cesarean section    . Wrist surgery      s/p left  after fall  . Incontinence surgery      s/p  . Orif ankle fracture Left 07/23/2012    Procedure: OPEN REDUCTION INTERNAL FIXATION (ORIF) ANKLE FRACTURE;  Surgeon: Jessy Oto, MD;  Location: [place];  Service: Orthopedics;  Laterality: Left;    History   Social History  . Marital Status: Widowed    Spouse Name: N/A    Number of Children: N/A  . Years of Education: N/A   Occupational History  . 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 Smoking status: Former Smoker -- 1.00 packs/day    Types: Cigarettes    Quit date: 11/11/2011  .  Smokeless tobacco: Not on file  . Alcohol Use: No  . Drug Use: No  . Sexual Activity: Not on file   Other Topics Concern  . Not on file   Social History Narrative   Widowed   Daily Caffeine Use-2 cups daily or more   Pt does not get regular exercise   Works [employer], 3rd shift    Current Outpatient Prescriptions on File Prior to Visit  Medication Sig Dispense Refill  . aspirin 81 MG tablet Take 81 mg by mouth 2 (two) times daily.      . calcium carbonate (OS-CAL) 600 MG TABS Take 1,200 mg by mouth daily.       . citalopram (CELEXA) 20 MG tablet Take 1 tablet (20 mg total) by mouth 2 (two) times daily.  180 tablet  3  . glucose blood (ONE TOUCH ULTRA TEST) test strip Two times a day dx 250.01       . omeprazole-sodium bicarbonate (ZEGERID) 40-1100 MG per capsule Take 1 capsule by mouth 2 (two) times daily. For heartburn  180 capsule  3  . RELION INSULIN SYR 0.5CC/29G 29G X 1/2" 0.5 ML MISC USE AS DIRECTED  100 each  3  . simvastatin (ZOCOR) 80 MG tablet Take 0.5 tablets (40 mg total) by mouth at bedtime.  45 tablet  3  . vitamin D, CHOLECALCIFEROL, 400 UNITS tablet  Take 400 Units by mouth daily.      . vitamin E (VITAMIN E) 400 UNIT capsule Take 400 Units by mouth daily.      . [DISCONTINUED] oxybutynin (DITROPAN) 5 MG tablet Take 5 mg by mouth 2 (two) times daily.         No current facility-administered medications on file prior to visit.    Allergies  Allergen Reactions  . Procaine Hcl     REACTION: hallucinations    Family History  Problem Relation Age of Onset  . Cancer Mother     Ovarian Cancer  . Stroke Father   . Diabetes Sister   . Stroke Sister   . Diabetes Brother   . Diabetes Sister   . Hypertension Other   . Hyperlipidemia Other     BP 116/60  Pulse 98  Temp(Src) 98 F (36.7 C) (Oral)  Ht 5\' 2"  (1.575 m)  Wt 155 lb (70.308 kg)  BMI 28.34 kg/m2  SpO2 95%  Review of Systems Pt says gerd sxs are worse, as she ran out of zegerid.  She thinks she  has had hypoglycemia, but she is not sure.   Denies LOC.  She has slight dizziness.  She has itching at the left ankle.    Objective:   Physical Exam VITAL SIGNS:  See vs page GENERAL: no distress  Lab Results  Component Value Date   HGBA1C 9.8* 02/18/2013      Assessment & Plan:  DM: This insulin regimen was chosen from multiple options, for its simplicity.  The benefits of glycemic control must be weighed against the risks of hypoglycemia.  therapy limited by noncompliance with cbg's.  i'll do the best i can.  She needs increased rx Pruritis, persistent HTN: ? overcontrolled GERD, therapy limited by noncompliance.  i'll do the best i can.

## 2013-02-18 NOTE — Telephone Encounter (Signed)
Carfate changed to tablets.

## 2013-02-21 ENCOUNTER — Telehealth: Payer: Self-pay

## 2013-02-21 MED ORDER — SUCRALFATE 1 G PO TABS: 1.0000 g | ORAL_TABLET | Freq: Three times a day (TID) | ORAL | Status: DC

## 2013-02-21 MED ORDER — SIMVASTATIN 80 MG PO TABS: 40.0000 mg | ORAL_TABLET | Freq: Every day | ORAL | Status: DC

## 2013-02-21 MED ORDER — LISINOPRIL 10 MG PO TABS: 10.0000 mg | ORAL_TABLET | Freq: Every day | ORAL | Status: DC

## 2013-02-21 MED ORDER — OMEPRAZOLE-SODIUM BICARBONATE 40-1100 MG PO CAPS: 1.0000 | ORAL_CAPSULE | Freq: Two times a day (BID) | ORAL | Status: DC

## 2013-02-21 NOTE — Telephone Encounter (Signed)
Fax received from [facility] request a refill for Celexa. Pt was last seen on 1.23.2015 and medication was last seen on 6.27.2014.  Please advise, Thanks!

## 2013-02-21 NOTE — Telephone Encounter (Signed)
Please refill prn 

## 2013-02-22 MED ORDER — CITALOPRAM HYDROBROMIDE 20 MG PO TABS: 20.0000 mg | ORAL_TABLET | Freq: Two times a day (BID) | ORAL | Status: DC

## 2013-02-22 NOTE — Telephone Encounter (Signed)
Done

## 2013-02-25 ENCOUNTER — Other Ambulatory Visit: Payer: Self-pay | Admitting: Endocrinology

## 2013-02-25 ENCOUNTER — Encounter: Payer: Self-pay | Admitting: Gastroenterology

## 2013-02-25 ENCOUNTER — Telehealth: Payer: Self-pay | Admitting: Endocrinology

## 2013-02-25 MED ORDER — OMEPRAZOLE 40 MG PO CPDR: 40.0000 mg | DELAYED_RELEASE_CAPSULE | Freq: Every day | ORAL | Status: DC

## 2013-02-25 NOTE — Telephone Encounter (Signed)
please call patient: Insurance rejected zegerid. i have sent a prescription to your pharmacy, for just the omeprazole part. You can take half a teaspoon of baking soda at the same time.  This would essentially be the same

## 2013-02-28 NOTE — Telephone Encounter (Signed)
Left message informing pt and instructed to call back to discuss if there were any questions or concerns.

## 2013-03-28 ENCOUNTER — Ambulatory Visit (INDEPENDENT_AMBULATORY_CARE_PROVIDER_SITE_OTHER): Payer: Commercial Managed Care - HMO | Admitting: Gastroenterology

## 2013-03-28 ENCOUNTER — Encounter: Payer: Self-pay | Admitting: Gastroenterology

## 2013-03-28 VITALS — BP 110/54 | HR 88 | Ht 61.0 in | Wt 153.5 lb

## 2013-03-28 DIAGNOSIS — K2270 Barrett's esophagus without dysplasia: Secondary | ICD-10-CM

## 2013-03-28 DIAGNOSIS — R1319 Other dysphagia: Secondary | ICD-10-CM

## 2013-03-28 DIAGNOSIS — R112 Nausea with vomiting, unspecified: Secondary | ICD-10-CM

## 2013-03-28 DIAGNOSIS — Z1211 Encounter for screening for malignant neoplasm of colon: Secondary | ICD-10-CM

## 2013-03-28 DIAGNOSIS — K219 Gastro-esophageal reflux disease without esophagitis: Secondary | ICD-10-CM

## 2013-03-28 MED ORDER — OMEPRAZOLE 40 MG PO CPDR: 40.0000 mg | DELAYED_RELEASE_CAPSULE | Freq: Two times a day (BID) | ORAL | Status: DC

## 2013-03-28 NOTE — Patient Instructions (Signed)
We have sent the following medications to your pharmacy for you to pick up at your convenience:Omeprazole.  You will be due for a recall Endoscopy/Colonoscopy in 3 months. We will send you a reminder in the mail when it gets closer to that time.  Thank you for choosing me and [facility].  Pricilla Riffle. Dagoberto Ligas., MD., Mar[facility] Regal

## 2013-03-28 NOTE — Progress Notes (Signed)
    History of Present Illness: This is a 68 year old female with a history of Barrett's esophagus and a duodenal tubulovillous adenoma. I last saw her in 2008. She has not returned for recommended followup. She was recommended a followup in 2011. She states she was  doing well on omeprazole 40 mg twice daily but states she's had difficulty obtaining insurance coverage for this medication and since then has had frequent reflux symptoms and episodic nausea and vomiting. She also notes infrequent episodes of solid food dysphagia. Denies weight loss, abdominal pain, constipation, diarrhea, change in stool caliber, melena, hematochezia, chest pain.  Current Medications, Allergies, Past Medical History, Past Surgical History, Family History and Social History were reviewed in [employer] record.  Physical Exam: General: Well developed , well nourished, no acute distress Head: Normocephalic and atraumatic Eyes:  sclerae anicteric, EOMI Ears: Normal auditory acuity Mouth: No deformity or lesions Lungs: Clear throughout to auscultation Heart: Regular rate and rhythm; no murmurs, rubs or bruits Abdomen: Soft, non tender and non distended. No masses, hepatosplenomegaly or hernias noted. Normal Bowel sounds Rectal: Deferred to colonoscopy Musculoskeletal: Symmetrical with no gross deformities  Pulses:  Normal pulses noted Extremities: No clubbing, cyanosis, edema or deformities noted Neurological: Alert oriented x 4, grossly nonfocal Psychological:  Alert and cooperative. Normal mood and affect  Assessment and Recommendations:  1. Gastroesophageal reflux disease complicated by erosive esophagitis and Barrett's esophagus. Symptoms uncontrolled due to lack of adequate medical therapy. Recent solid food dysphagia. Maintain all standard antireflux measures and a proton pump inhibitor on a twice a day basis long term. Resume omeprazole 40 mg p.o. b.i.d. Barret's esophagus was discussed  with the patient, and she understands that it is a precancerous lesion. I recommended a followup endoscopy in November 2011 however she did not return. Plan for a followup EGD in 3 months to allow for healing of suspected active erosive esophagitis. I stressed the importance of returning for followup endoscopy.  2. Duodenal tubulovillous adenoma. A follow up evaluation for this and Barrett's esophagus was recommended for November 2011 however she did not return. Plan for EGD in 3 months as above.  3. Colorectal cancer screening, average risk. No prior colonoscopy. Risks, benefits and alternatives to colonoscopy with possibly biopsy and possibly polypectomy discussed with the patient. She consents to proceed. This will be scheduled electively at the time of her upper endoscopy.

## 2013-04-27 ENCOUNTER — Telehealth: Payer: Self-pay

## 2013-04-27 DIAGNOSIS — E119 Type 2 diabetes mellitus without complications: Secondary | ICD-10-CM

## 2013-04-27 NOTE — Telephone Encounter (Signed)
Pt called requesting a referral to  Dr. Lorrene Reid (eye Doc). Please advise, Thanks!

## 2013-04-27 NOTE — Telephone Encounter (Signed)
done

## 2013-05-05 ENCOUNTER — Encounter: Payer: Self-pay | Admitting: Gastroenterology

## 2013-05-17 ENCOUNTER — Encounter: Payer: Self-pay | Admitting: Gastroenterology

## 2013-05-19 ENCOUNTER — Encounter: Payer: Self-pay | Admitting: Endocrinology

## 2013-05-19 ENCOUNTER — Ambulatory Visit (INDEPENDENT_AMBULATORY_CARE_PROVIDER_SITE_OTHER): Payer: Commercial Managed Care - HMO | Admitting: Endocrinology

## 2013-05-19 VITALS — BP 112/50 | HR 100 | Temp 97.6°F | Ht 61.0 in | Wt 158.0 lb

## 2013-05-19 DIAGNOSIS — E1029 Type 1 diabetes mellitus with other diabetic kidney complication: Secondary | ICD-10-CM

## 2013-05-19 LAB — HEMOGLOBIN A1C: Hgb A1c MFr Bld: 10.1 % — ABNORMAL HIGH (ref 4.6–6.5)

## 2013-05-19 NOTE — Progress Notes (Signed)
Subjective:    Patient ID: Yvonne Reynolds, female    DOB: August 05, 1945, 68 y.o.   MRN: [id number]  HPI Pt returns for f/u of insulin-requiring DM (dx'ed 1995, when she was noted to have glycosuria; she has mild if any neuropathy of the lower extremities, but she has associated retinopathy and nephropathy; she has been on insulin since approx 1999; she has never had severe hypoglycemia or DKA; she chose a simple and inexpensive bid premixed insulin).  no cbg record, but states cbg's vary from 60-300.  It is in general higher as the day goes on.   Past Medical History  Diagnosis Date  . 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 DIABETES MELLITUS, TYPE II 12/21/2006  . DIABETIC  RETINOPATHY 12/22/2006  . HYPERLIPIDEMIA 12/22/2006  . DEPRESSION 06/26/2006  . OBSTRUCTIVE SLEEP APNEA 12/22/2006    no CPAP  . HYPERTENSION 06/26/2006  . ALLERGIC RHINITIS 12/22/2006  . CHRONIC OBSTRUCTIVE PULMONARY DISEASE, ACUTE EXACERBATION 08/24/2007    pt denies 03/28/2013 sd  . Reflux esophagitis 12/22/2006  . GERD 06/26/2006    with EE  . BARRETTS ESOPHAGUS 12/22/2006    without dysplasia  . HIATAL HERNIA 12/22/2006  . OSTEOARTHRITIS 06/26/2006  . OSTEOPOROSIS 06/26/2006    Past Surgical History  Procedure Laterality Date  . Abdominal hysterectomy    . Esophagogastroduodenoscopy  05/07/2004  . Cesarean section    . Wrist surgery      s/p left  after fall  . Incontinence surgery      s/p  . Orif ankle fracture Left 07/23/2012    Procedure: OPEN REDUCTION INTERNAL FIXATION (ORIF) ANKLE FRACTURE;  Surgeon: Jessy Oto, MD;  Location: [place];  Service: Orthopedics;  Laterality: Left;    History   Social History  . Marital Status: Widowed    Spouse Name: N/A    Number of Children: 3  . Years of Education: N/A   Occupational History  . 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  Smoking status: Former Smoker -- 1.00 packs/day    Types: Cigarettes    Quit date: 11/11/2011  . Smokeless tobacco: Never Used  . Alcohol Use: No  . Drug Use: No  . Sexual Activity: Not on file   Other Topics Concern  . Not on file   Social History Narrative   Widowed   Daily Caffeine Use-2 cups daily or more   Pt does not get regular exercise   Works [employer], 3rd shift    Current Outpatient Prescriptions on File Prior to Visit  Medication Sig Dispense Refill  . calcium carbonate (OS-CAL) 600 MG TABS Take 1,200 mg by mouth daily.       . citalopram (CELEXA) 20 MG tablet Take 1 tablet (20 mg total) by mouth 2 (two) times daily.  180 tablet  prn  . glucose blood (ONE TOUCH ULTRA TEST) test strip Two times a day dx 250.01       . insulin NPH-regular Human (NOVOLIN 70/30) (70-30) 100 UNIT/ML injection Inject 60 Units into the skin daily with breakfast.      . omeprazole (PRILOSEC) 40 MG capsule Take 1 capsule (40 mg total) by mouth 2 (two) times daily.  60 capsule  11  . RELION INSULIN SYR 0.5CC/29G 29G X 1/2" 0.5 ML MISC USE AS DIRECTED  100 each  3  . simvastatin (ZOCOR) 80  MG tablet Take 0.5 tablets (40 mg total) by mouth at bedtime.  45 tablet  3  . sucralfate (CARAFATE) 1 G tablet Take 1 tablet (1 g total) by mouth 4 (four) times daily -  with meals and at bedtime.  30 tablet  3  . triamcinolone cream (KENALOG) 0.1 % Apply topically 3 (three) times daily. As needed for itching  30 g  3  . vitamin D, CHOLECALCIFEROL, 400 UNITS tablet Take 400 Units by mouth daily.      . vitamin E (VITAMIN E) 400 UNIT capsule Take 400 Units by mouth daily.      . [DISCONTINUED] oxybutynin (DITROPAN) 5 MG tablet Take 5 mg by mouth 2 (two) times daily.         No current facility-administered medications on file prior to visit.    Allergies  Allergen Reactions  . Procaine Hcl     REACTION: hallucinations    Family History  Problem Relation Age of Onset  . Cancer Mother     Ovarian Cancer   . Stroke Father   . Diabetes Sister   . Stroke Sister   . Diabetes Brother   . Diabetes Sister   . Hypertension Other   . Hyperlipidemia Other     BP 112/50  Pulse 100  Temp(Src) 97.6 F (36.4 C) (Oral)  Ht 5\' 1"  (1.549 m)  Wt 158 lb (71.668 kg)  BMI 29.87 kg/m2  SpO2 94%  Review of Systems She has an itchy rash on the back of the neck.  She denies LOC    Objective:   Physical Exam VITAL SIGNS:  See vs page GENERAL: no distress Skin: dyshidrosis at the back of the neck.   Lab Results  Component Value Date   HGBA1C 10.1* 05/19/2013      Assessment & Plan:  DM: This insulin regimen was chosen from multiple options, for its simplicity.  The benefits of glycemic control must be weighed against the risks of hypoglycemia.  therapy limited by noncompliance with cbg's.  i'll do the best i can.  She needs increased rx, even if she has to eat a snack at hs to defend against nocturnal hypoglycemia. Dyshidrosis, vs early psoriasis.

## 2013-05-19 NOTE — Patient Instructions (Addendum)
check your blood sugar once a day.  vary the time of day when you check, between before the 3 meals, and at bedtime.  also check if you have symptoms of your blood sugar being too high or too low.  please keep a record of the readings and bring it to your next appointment here.  please call us sooner if your blood sugar goes below 70, or if you have a lot of readings over 200.   blood tests are being requested for you today.  We'll contact you with results. Please come back for a regular physical appointment in 3 months.  Please apply the skin cream to the back of your neck also. Please stop taking the lisinopril medication.

## 2013-06-22 ENCOUNTER — Ambulatory Visit (AMBULATORY_SURGERY_CENTER): Payer: Self-pay

## 2013-06-22 VITALS — Ht 61.0 in | Wt 157.0 lb

## 2013-06-22 DIAGNOSIS — K2270 Barrett's esophagus without dysplasia: Secondary | ICD-10-CM

## 2013-06-22 NOTE — Progress Notes (Signed)
No allergies to eggs or soy No diet/weight loss meds No home oxygen No past problems with anesthesia except with conscious sedation (was told she was unresponsive with shallow breathing) No email

## 2013-07-04 ENCOUNTER — Telehealth: Payer: Self-pay | Admitting: Gastroenterology

## 2013-07-04 NOTE — Telephone Encounter (Signed)
Patient states she cannot pick up the prescription for omeprazole that we prescribed because the pharmacy states it is too soon to refill. Patient also states she was prescribed another medication in the place of omeprazole by her PCP and she cannot continue this medication because it makes her sick. Pt spelled the medication but when she did spell the medicine, she spelled omeprazole which I told her was the medication Dr. Fuller Plan prescribed her in March. Patient states it is a different medicine that she has to mix with baking soda. I told her I was not sure why she was given this medication in the place of what we gave her. Pt states she received a letter in the mail stating her omeprazole was not covered some time in April. Pt states she did not contact our office. I told her to purchase OTC omeprazole 40 twice daily to take until she can get her medication refilled on 07/18/13. Pt agreed and verbalized understanding.

## 2013-07-06 ENCOUNTER — Encounter: Payer: Self-pay | Admitting: Gastroenterology

## 2013-07-06 ENCOUNTER — Ambulatory Visit (AMBULATORY_SURGERY_CENTER): Payer: Commercial Managed Care - HMO | Admitting: Gastroenterology

## 2013-07-06 VITALS — BP 153/73 | HR 76 | Temp 98.3°F | Resp 17 | Ht 61.0 in | Wt 157.0 lb

## 2013-07-06 DIAGNOSIS — K219 Gastro-esophageal reflux disease without esophagitis: Secondary | ICD-10-CM

## 2013-07-06 DIAGNOSIS — K209 Esophagitis, unspecified without bleeding: Secondary | ICD-10-CM

## 2013-07-06 DIAGNOSIS — K2270 Barrett's esophagus without dysplasia: Secondary | ICD-10-CM

## 2013-07-06 HISTORY — PX: ESOPHAGOGASTRODUODENOSCOPY (EGD) WITH PROPOFOL: SHX5813

## 2013-07-06 LAB — GLUCOSE, CAPILLARY
GLUCOSE-CAPILLARY: 195 mg/dL — AB (ref 70–99)
Glucose-Capillary: 200 mg/dL — ABNORMAL HIGH (ref 70–99)

## 2013-07-06 MED ORDER — SODIUM CHLORIDE 0.9 % IV SOLN: 500.0000 mL | INTRAVENOUS | Status: DC

## 2013-07-06 MED ORDER — OMEPRAZOLE 40 MG PO CPDR: 40.0000 mg | DELAYED_RELEASE_CAPSULE | Freq: Two times a day (BID) | ORAL | Status: DC

## 2013-07-06 MED ORDER — OMEPRAZOLE 40 MG PO CPDR: 40.0000 mg | DELAYED_RELEASE_CAPSULE | Freq: Every day | ORAL | Status: DC

## 2013-07-06 NOTE — Progress Notes (Signed)
No complaints noted in the recovery room. Maw   

## 2013-07-06 NOTE — Patient Instructions (Addendum)
Handout was given to your care partner on GERD and Barrett's. You may resume your current medications today.  Rx was sent to [facility] on [address] for omrprazole 40 mg take 2 x daily.  If you are not able to get rx from the pharmacy take omeprazole 20 my take 2 pill 2 x daily until you are able to get the 40 mg strength. Await biopsy results. Please call if any questions or concerns.     YOU HAD AN ENDOSCOPIC PROCEDURE TODAY AT [facility]: Refer to the procedure report that was given to you for any specific questions about what was found during the examination.  If the procedure report does not answer your questions, please call your gastroenterologist to clarify.  If you requested that your care partner not be given the details of your procedure findings, then the procedure report has been included in a sealed envelope for you to review at your convenience later.  YOU SHOULD EXPECT: Some feelings of bloating in the abdomen. Passage of more gas than usual.  Walking can help get rid of the air that was put into your GI tract during the procedure and reduce the bloating. If you had a lower endoscopy (such as a colonoscopy or flexible sigmoidoscopy) you may notice spotting of blood in your stool or on the toilet paper. If you underwent a bowel prep for your procedure, then you may not have a normal bowel movement for a few days.  DIET: Your first meal following the procedure should be a light meal and then it is ok to progress to your normal diet.  A half-sandwich or bowl of soup is an example of a good first meal.  Heavy or fried foods are harder to digest and may make you feel nauseous or bloated.  Likewise meals heavy in dairy and vegetables can cause extra gas to form and this can also increase the bloating.  Drink plenty of fluids but you should avoid alcoholic beverages for 24 hours.  ACTIVITY: Your care partner should take you home directly after the procedure.  You should plan to  take it easy, moving slowly for the rest of the day.  You can resume normal activity the day after the procedure however you should NOT DRIVE or use heavy machinery for 24 hours (because of the sedation medicines used during the test).    SYMPTOMS TO REPORT IMMEDIATELY: A gastroenterologist can be reached at any hour.  During normal business hours, 8:30 AM to 5:00 PM Monday through Friday, call [phone].  After hours and on weekends, please call the GI answering service at [phone] who will take a message and have the physician on call contact you.     Following upper endoscopy (EGD)  Vomiting of blood or coffee ground material  New chest pain or pain under the shoulder blades  Painful or persistently difficult swallowing  New shortness of breath  Fever of 100F or higher  Black, tarry-looking stools  FOLLOW UP: If any biopsies were taken you will be contacted by phone or by letter within the next 1-3 weeks.  Call your gastroenterologist if you have not heard about the biopsies in 3 weeks.  Our staff will call the home number listed on your records the next business day following your procedure to check on you and address any questions or concerns that you may have at that time regarding the information given to you following your procedure. This is a courtesy call and  so if there is no answer at the home number and we have not heard from you through the emergency physician on call, we will assume that you have returned to your regular daily activities without incident.  SIGNATURES/CONFIDENTIALITY: You and/or your care partner have signed paperwork which will be entered into your electronic medical record.  These signatures attest to the fact that that the information above on your After Visit Summary has been reviewed and is understood.  Full responsibility of the confidentiality of this discharge information lies with you and/or your care-partner.

## 2013-07-06 NOTE — Progress Notes (Signed)
A/ox3 pleased with MAC, report to Annette RN 

## 2013-07-06 NOTE — Progress Notes (Signed)
Called to room to assist during endoscopic procedure.  Patient ID and intended procedure confirmed with present staff. Received instructions for my participation in the procedure from the performing physician.  

## 2013-07-06 NOTE — Op Note (Signed)
[place]  [address]. [place], [address]   ENDOSCOPY PROCEDURE REPORT  PATIENT: Yvonne Reynolds, Yvonne Reynolds  MR#: [id number] BIRTHDATE: 07-16-1945 , 68  yrs. old GENDER: Female ENDOSCOPIST: Ladene Artist, MD, [facility]     [R PROCEDURE DATE:  07/06/2013 PROCEDURE:  EGD w/ biopsy ASA CLASS:     Class II INDICATIONS:  follow up of Barrett's esophagus.   History of reflux esophagitis. MEDICATIONS: MAC sedation, administered by CRNA and propofol (Diprivan) 140mg  IV TOPICAL ANESTHETIC: none DESCRIPTION OF PROCEDURE: After the risks benefits and alternatives of the procedure were thoroughly explained, informed consent was obtained.  The LB [id number] endoscope was introduced through the mouth and advanced to the second portion of the duodenum. Without limitations.  The instrument was slowly withdrawn as the mucosa was fully examined.  ESOPHAGUS: There was LA Class C esophagitis noted.   There was evidence of Barrett's esophagus involving 2 cm near the EGJ in the lower third of the esophagus.  Multiple biopsies were performed. The esophagus was otherwise normal. STOMACH: The mucosa and folds of the stomach appeared normal. DUODENUM: The duodenal mucosa showed no abnormalities in the bulb and second portion of the duodenum.  Retroflexed views revealed a moderate hiatal hernia.   The scope was then withdrawn from the patient and the procedure completed.  COMPLICATIONS: There were no complications.  ENDOSCOPIC IMPRESSION: 1.   LA Class C esophagitis 2.   Barrett's esophagus; multiple biopsies 3.   Moderate hiatal hernia  RECOMMENDATIONS: 1.  Anti-reflux regimen and 4 inch bedblocks long term 2.  Continue PPI bid long term: omeprazole 40 mg po bid 3.  Await pathology results 4.  EGD in 3 years if no dysplasia  eSigned:  Ladene Artist, MD, [facility] 07/06/2013 9:18 AM

## 2013-07-07 ENCOUNTER — Telehealth: Payer: Self-pay | Admitting: Gastroenterology

## 2013-07-07 ENCOUNTER — Telehealth: Payer: Self-pay | Admitting: *Deleted

## 2013-07-07 NOTE — Telephone Encounter (Signed)
  Follow up Call-  Call back number 07/06/2013  Post procedure Call Back phone  # [phone]  Permission to leave phone message Yes     Patient questions:  Message left to call us if necessary.

## 2013-07-07 NOTE — Telephone Encounter (Signed)
Went over with patient again to wait until 07/18/13 to pick up refill since they will not fill medicine early. Also to take omeprazole 40 mg twice daily over the counter until then. Patient agreed and will purchase OTC.

## 2013-07-07 NOTE — Telephone Encounter (Signed)
Left message for patient to return my call.

## 2013-07-13 ENCOUNTER — Encounter: Payer: Self-pay | Admitting: Gastroenterology

## 2013-07-28 ENCOUNTER — Telehealth: Payer: Self-pay | Admitting: *Deleted

## 2013-07-28 DIAGNOSIS — E119 Type 2 diabetes mellitus without complications: Secondary | ICD-10-CM

## 2013-07-28 NOTE — Telephone Encounter (Signed)
Left message on machine for patient to make a lab appointment.  Lipid panel ordered.

## 2013-08-09 ENCOUNTER — Ambulatory Visit: Payer: Commercial Managed Care - HMO | Admitting: Endocrinology

## 2013-08-15 ENCOUNTER — Ambulatory Visit (INDEPENDENT_AMBULATORY_CARE_PROVIDER_SITE_OTHER): Payer: Commercial Managed Care - HMO | Admitting: Endocrinology

## 2013-08-15 ENCOUNTER — Encounter: Payer: Self-pay | Admitting: Endocrinology

## 2013-08-15 VITALS — BP 130/60 | HR 96 | Temp 98.4°F | Ht 61.0 in | Wt 158.0 lb

## 2013-08-15 DIAGNOSIS — E119 Type 2 diabetes mellitus without complications: Secondary | ICD-10-CM

## 2013-08-15 DIAGNOSIS — E785 Hyperlipidemia, unspecified: Secondary | ICD-10-CM

## 2013-08-15 DIAGNOSIS — E875 Hyperkalemia: Secondary | ICD-10-CM

## 2013-08-15 DIAGNOSIS — Z23 Encounter for immunization: Secondary | ICD-10-CM

## 2013-08-15 DIAGNOSIS — E1029 Type 1 diabetes mellitus with other diabetic kidney complication: Secondary | ICD-10-CM

## 2013-08-15 DIAGNOSIS — Z79899 Other long term (current) drug therapy: Secondary | ICD-10-CM

## 2013-08-15 DIAGNOSIS — D508 Other iron deficiency anemias: Secondary | ICD-10-CM

## 2013-08-15 DIAGNOSIS — R809 Proteinuria, unspecified: Secondary | ICD-10-CM

## 2013-08-15 DIAGNOSIS — I10 Essential (primary) hypertension: Secondary | ICD-10-CM

## 2013-08-15 LAB — URINALYSIS, ROUTINE W REFLEX MICROSCOPIC
Bilirubin Urine: NEGATIVE
Hgb urine dipstick: NEGATIVE
Ketones, ur: NEGATIVE
Leukocytes, UA: NEGATIVE
NITRITE: NEGATIVE
SPECIFIC GRAVITY, URINE: 1.015 (ref 1.000–1.030)
Total Protein, Urine: 30 — AB
UROBILINOGEN UA: 0.2 (ref 0.0–1.0)
Urine Glucose: NEGATIVE
pH: 5.5 (ref 5.0–8.0)

## 2013-08-15 NOTE — Progress Notes (Signed)
Subjective:    Patient ID: Yvonne Reynolds, female    DOB: 1946/01/02, 68 y.o.   MRN: [id number]  HPI Pt returns for f/u of insulin-requiring DM (dx'ed 1995, when she was noted to have glycosuria; she has mild if any neuropathy of the lower extremities, but she has associated retinopathy and nephropathy; she has been on insulin since approx 1999; she has never had severe hypoglycemia, pancreatitis, or DKA; she chose a simple and inexpensive bid premixed insulin).  no cbg record, but states cbg's vary from 130-300's.  It is in general higher as the day goes on.  She says she never misses the insulin.   Past Medical History  Diagnosis Date  . 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 DIABETES MELLITUS, TYPE II 12/21/2006  . DIABETIC  RETINOPATHY 12/22/2006  . HYPERLIPIDEMIA 12/22/2006  . DEPRESSION 06/26/2006  . OBSTRUCTIVE SLEEP APNEA 12/22/2006    no CPAP  . HYPERTENSION 06/26/2006  . ALLERGIC RHINITIS 12/22/2006  . CHRONIC OBSTRUCTIVE PULMONARY DISEASE, ACUTE EXACERBATION 08/24/2007    pt denies 03/28/2013 sd  . Reflux esophagitis 12/22/2006  . GERD 06/26/2006    with EE  . BARRETTS ESOPHAGUS 12/22/2006    without dysplasia  . HIATAL HERNIA 12/22/2006  . OSTEOARTHRITIS 06/26/2006  . OSTEOPOROSIS 06/26/2006    Past Surgical History  Procedure Laterality Date  . Abdominal hysterectomy    . Esophagogastroduodenoscopy  05/07/2004  . Cesarean section    . Wrist surgery      s/p left  after fall  . Incontinence surgery      s/p  . Orif ankle fracture Left 07/23/2012    Procedure: OPEN REDUCTION INTERNAL FIXATION (ORIF) ANKLE FRACTURE;  Surgeon: Jessy Oto, MD;  Location: [facility];  Service: Orthopedics;  Laterality: Left;    History   Social History  . Marital Status: Widowed    Spouse Name: N/A    Number of Children: 3  . Years of Education: N/A   Occupational History  . 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 Smoking status: Former Smoker -- 1.00 packs/day    Types: Cigarettes    Quit date: 11/11/2011  . Smokeless tobacco: Never Used  . Alcohol Use: No  . Drug Use: No  . Sexual Activity: Not on file   Other Topics Concern  . Not on file   Social History Narrative   Widowed   Daily Caffeine Use-2 cups daily or more   Pt does not get regular exercise   Works [employer], 3rd shift    Current Outpatient Prescriptions on File Prior to Visit  Medication Sig Dispense Refill  . calcium carbonate (OS-CAL) 600 MG TABS Take 1,200 mg by mouth daily.       . citalopram (CELEXA) 20 MG tablet Take 1 tablet (20 mg total) by mouth 2 (two) times daily.  180 tablet  prn  . glucose blood (ONE TOUCH ULTRA TEST) test strip Two times a day dx 250.01       . insulin NPH-regular Human (NOVOLIN 70/30) (70-30) 100 UNIT/ML injection Inject 60 Units into the skin daily with breakfast.      . omeprazole (PRILOSEC) 40 MG capsule Take 1 capsule (40 mg total) by mouth 2 (two) times daily.  60 capsule  11  . Omeprazole-Sodium Bicarbonate (ZEGERID OTC PO) Take 40 mg by mouth daily.      Marland Kitchen  RELION INSULIN SYR 0.5CC/29G 29G X 1/2" 0.5 ML MISC USE AS DIRECTED  100 each  3  . simvastatin (ZOCOR) 80 MG tablet Take 0.5 tablets (40 mg total) by mouth at bedtime.  45 tablet  3  . sucralfate (CARAFATE) 1 G tablet Take 1 tablet (1 g total) by mouth 4 (four) times daily -  with meals and at bedtime.  30 tablet  3  . triamcinolone cream (KENALOG) 0.1 % Apply topically 3 (three) times daily. As needed for itching  30 g  3  . vitamin D, CHOLECALCIFEROL, 400 UNITS tablet Take 400 Units by mouth daily.      . vitamin E (VITAMIN E) 400 UNIT capsule Take 400 Units by mouth daily.      . [DISCONTINUED] oxybutynin (DITROPAN) 5 MG tablet Take 5 mg by mouth 2 (two) times daily.         No current facility-administered medications on file prior to visit.    Allergies  Allergen  Reactions  . Procaine Hcl     REACTION: hallucinations    Family History  Problem Relation Age of Onset  . Cancer Mother     Ovarian Cancer  . Stroke Father   . Diabetes Sister   . Stroke Sister   . Diabetes Brother   . Diabetes Sister   . Hypertension Other   . Hyperlipidemia Other   . Colon cancer Neg Hx   . Pancreatic cancer Neg Hx   . Stomach cancer Neg Hx     BP 130/60  Pulse 96  Temp(Src) 98.4 F (36.9 C) (Oral)  Ht 5\' 1"  (1.549 m)  Wt 158 lb (71.668 kg)  BMI 29.87 kg/m2  SpO2 90%    Review of Systems She denies hypoglycemia and weight change.    Objective:   Physical Exam VITAL SIGNS:  See vs page GENERAL: no distress Pulses: dorsalis pedis intact bilat.   Feet: no deformity. normal color and temp.  no edema Skin:  no ulcer on the feet.   Neuro: sensation is intact to touch on the feet    Lab Results  Component Value Date   WBC 13.9* 08/15/2013   HGB 9.2* 08/15/2013   HCT 30.0* 08/15/2013   PLT 439.0* 08/15/2013   GLUCOSE 190* 08/15/2013   CHOL 163 08/15/2013   TRIG 89.0 08/15/2013   HDL 49.20 08/15/2013   LDLDIRECT 185.6 03/14/2009   LDLCALC 96 08/15/2013   ALT 14 08/15/2013   AST 19 08/15/2013   NA 136 08/15/2013   K 5.6* 08/15/2013   CL 102 08/15/2013   CREATININE 1.0 08/15/2013   BUN 19 08/15/2013   CO2 27 08/15/2013   TSH 1.64 08/15/2013   INR 0.84 06/18/2009   HGBA1C 10.5* 08/15/2013   MICROALBUR 36.2* 08/15/2013      Assessment & Plan:  Hyperkalemia, new, uncertain etiology, ? Lab error DM: severe exacerbation Anemia, worse, uncertain etiology      Patient is advised the following: Patient Instructions  check your blood sugar once a day.  vary the time of day when you check, between before the 3 meals, and at bedtime.  also check if you have symptoms of your blood sugar being too high or too low.  please keep a record of the readings and bring it to your next appointment here.  please call us sooner if your blood sugar goes below 70, or if  you have a lot of readings over 200.   blood and urine tests are being  requested for you today.  We'll contact you with results.  Please come back for a regular physical appointment in 3 months.

## 2013-08-15 NOTE — Patient Instructions (Addendum)
check your blood sugar once a day.  vary the time of day when you check, between before the 3 meals, and at bedtime.  also check if you have symptoms of your blood sugar being too high or too low.  please keep a record of the readings and bring it to your next appointment here.  please call us sooner if your blood sugar goes below 70, or if you have a lot of readings over 200.   blood and urine tests are being requested for you today.  We'll contact you with results.  Please come back for a regular physical appointment in 3 months.

## 2013-08-16 ENCOUNTER — Telehealth: Payer: Self-pay | Admitting: Endocrinology

## 2013-08-16 DIAGNOSIS — E875 Hyperkalemia: Secondary | ICD-10-CM | POA: Insufficient documentation

## 2013-08-16 LAB — CBC WITH DIFFERENTIAL/PLATELET
BASOS ABS: 0 10*3/uL (ref 0.0–0.1)
Basophils Relative: 0 % (ref 0.0–3.0)
Eosinophils Absolute: 0.2 10*3/uL (ref 0.0–0.7)
Eosinophils Relative: 1.2 % (ref 0.0–5.0)
HCT: 30 % — ABNORMAL LOW (ref 36.0–46.0)
HEMOGLOBIN: 9.2 g/dL — AB (ref 12.0–15.0)
LYMPHS PCT: 18.6 % (ref 12.0–46.0)
Lymphs Abs: 2.6 10*3/uL (ref 0.7–4.0)
MCHC: 30.8 g/dL (ref 30.0–36.0)
MCV: 71.6 fl — ABNORMAL LOW (ref 78.0–100.0)
Monocytes Absolute: 0.2 10*3/uL (ref 0.1–1.0)
Monocytes Relative: 1.7 % — ABNORMAL LOW (ref 3.0–12.0)
NEUTROS ABS: 10.9 10*3/uL — AB (ref 1.4–7.7)
Neutrophils Relative %: 78.5 % — ABNORMAL HIGH (ref 43.0–77.0)
PLATELETS: 439 10*3/uL — AB (ref 150.0–400.0)
RBC: 4.18 Mil/uL (ref 3.87–5.11)
RDW: 16.9 % — AB (ref 11.5–15.5)
WBC: 13.9 10*3/uL — ABNORMAL HIGH (ref 4.0–10.5)

## 2013-08-16 LAB — BASIC METABOLIC PANEL
BUN: 19 mg/dL (ref 6–23)
CHLORIDE: 102 meq/L (ref 96–112)
CO2: 27 meq/L (ref 19–32)
Calcium: 9.4 mg/dL (ref 8.4–10.5)
Creatinine, Ser: 1 mg/dL (ref 0.4–1.2)
GFR: 61.01 mL/min (ref >60.00)
GLUCOSE: 190 mg/dL — AB (ref 70–99)
POTASSIUM: 5.6 meq/L — AB (ref 3.5–5.1)
SODIUM: 136 meq/L (ref 135–145)

## 2013-08-16 LAB — MICROALBUMIN / CREATININE URINE RATIO
CREATININE, U: 115.2 mg/dL
Microalb Creat Ratio: 31.4 mg/g — ABNORMAL HIGH (ref 0.0–30.0)
Microalb, Ur: 36.2 mg/dL — ABNORMAL HIGH (ref 0.0–1.9)

## 2013-08-16 LAB — HEPATIC FUNCTION PANEL
ALK PHOS: 91 U/L (ref 39–117)
ALT: 14 U/L (ref 0–35)
AST: 19 U/L (ref 0–37)
Albumin: 3.7 g/dL (ref 3.5–5.2)
BILIRUBIN DIRECT: 0.1 mg/dL (ref 0.0–0.3)
TOTAL PROTEIN: 6.9 g/dL (ref 6.0–8.3)
Total Bilirubin: 0.7 mg/dL (ref 0.2–1.2)

## 2013-08-16 LAB — LIPID PANEL
CHOLESTEROL: 163 mg/dL (ref 0–200)
HDL: 49.2 mg/dL (ref >39.00)
LDL Cholesterol: 96 mg/dL (ref 0–99)
NonHDL: 113.8
Total CHOL/HDL Ratio: 3
Triglycerides: 89 mg/dL (ref 0.0–149.0)
VLDL: 17.8 mg/dL (ref 0.0–40.0)

## 2013-08-16 LAB — TSH: TSH: 1.64 u[IU]/mL (ref 0.35–4.50)

## 2013-08-16 LAB — HEMOGLOBIN A1C: Hgb A1c MFr Bld: 10.5 % — ABNORMAL HIGH (ref 4.6–6.5)

## 2013-08-16 NOTE — Telephone Encounter (Signed)
please call patient: Verify you take 60 units qam, and take as rx'ed. Then increase to 80 units qam

## 2013-08-17 NOTE — Telephone Encounter (Signed)
Pt advised via voicemail. 

## 2013-08-24 ENCOUNTER — Other Ambulatory Visit (INDEPENDENT_AMBULATORY_CARE_PROVIDER_SITE_OTHER): Payer: Commercial Managed Care - HMO

## 2013-08-24 DIAGNOSIS — D508 Other iron deficiency anemias: Secondary | ICD-10-CM

## 2013-08-24 DIAGNOSIS — Z23 Encounter for immunization: Secondary | ICD-10-CM

## 2013-08-24 DIAGNOSIS — E785 Hyperlipidemia, unspecified: Secondary | ICD-10-CM

## 2013-08-24 DIAGNOSIS — E1029 Type 1 diabetes mellitus with other diabetic kidney complication: Secondary | ICD-10-CM

## 2013-08-24 DIAGNOSIS — E119 Type 2 diabetes mellitus without complications: Secondary | ICD-10-CM

## 2013-08-24 DIAGNOSIS — E875 Hyperkalemia: Secondary | ICD-10-CM

## 2013-08-24 DIAGNOSIS — I10 Essential (primary) hypertension: Secondary | ICD-10-CM

## 2013-08-24 DIAGNOSIS — Z79899 Other long term (current) drug therapy: Secondary | ICD-10-CM

## 2013-08-24 DIAGNOSIS — R809 Proteinuria, unspecified: Secondary | ICD-10-CM

## 2013-08-24 LAB — CBC WITH DIFFERENTIAL/PLATELET
Basophils Absolute: 0 10*3/uL (ref 0.0–0.1)
Basophils Relative: 0.4 % (ref 0.0–3.0)
Eosinophils Absolute: 0.2 10*3/uL (ref 0.0–0.7)
Eosinophils Relative: 1.6 % (ref 0.0–5.0)
HEMATOCRIT: 27.3 % — AB (ref 36.0–46.0)
LYMPHS ABS: 2.6 10*3/uL (ref 0.7–4.0)
Lymphocytes Relative: 27 % (ref 12.0–46.0)
MCHC: 31.8 g/dL (ref 30.0–36.0)
MCV: 70.4 fl — ABNORMAL LOW (ref 78.0–100.0)
Monocytes Absolute: 0.6 10*3/uL (ref 0.1–1.0)
Monocytes Relative: 6.2 % (ref 3.0–12.0)
Neutro Abs: 6.2 10*3/uL (ref 1.4–7.7)
Neutrophils Relative %: 64.8 % (ref 43.0–77.0)
PLATELETS: 415 10*3/uL — AB (ref 150.0–400.0)
RBC: 3.88 Mil/uL (ref 3.87–5.11)
RDW: 17 % — ABNORMAL HIGH (ref 11.5–15.5)
WBC: 9.5 10*3/uL (ref 4.0–10.5)

## 2013-08-24 LAB — IBC PANEL
IRON: 22 ug/dL — AB (ref 42–145)
SATURATION RATIOS: 4.2 % — AB (ref 20.0–50.0)
TRANSFERRIN: 375.5 mg/dL — AB (ref 212.0–360.0)

## 2013-08-24 LAB — BASIC METABOLIC PANEL
BUN: 22 mg/dL (ref 6–23)
CO2: 25 mEq/L (ref 19–32)
Calcium: 9.4 mg/dL (ref 8.4–10.5)
Chloride: 103 mEq/L (ref 96–112)
Creatinine, Ser: 1 mg/dL (ref 0.4–1.2)
GFR: 60.29 mL/min (ref >60.00)
Glucose, Bld: 126 mg/dL — ABNORMAL HIGH (ref 70–99)
Potassium: 4.5 mEq/L (ref 3.5–5.1)
SODIUM: 136 meq/L (ref 135–145)

## 2013-09-12 ENCOUNTER — Ambulatory Visit (INDEPENDENT_AMBULATORY_CARE_PROVIDER_SITE_OTHER): Payer: Commercial Managed Care - HMO | Admitting: Endocrinology

## 2013-09-12 ENCOUNTER — Encounter: Payer: Self-pay | Admitting: Endocrinology

## 2013-09-12 VITALS — BP 128/62 | HR 87 | Temp 97.9°F | Ht 61.0 in | Wt 157.0 lb

## 2013-09-12 DIAGNOSIS — D508 Other iron deficiency anemias: Secondary | ICD-10-CM

## 2013-09-12 LAB — CBC WITH DIFFERENTIAL/PLATELET
Basophils Absolute: 0 K/uL (ref 0.0–0.1)
Basophils Relative: 0.3 % (ref 0.0–3.0)
Eosinophils Absolute: 0.1 K/uL (ref 0.0–0.7)
Eosinophils Relative: 0.9 % (ref 0.0–5.0)
HCT: 32.3 % — ABNORMAL LOW (ref 36.0–46.0)
Hemoglobin: 10.4 g/dL — ABNORMAL LOW (ref 12.0–15.0)
Lymphocytes Relative: 19.1 % (ref 12.0–46.0)
Lymphs Abs: 2.4 K/uL (ref 0.7–4.0)
MCHC: 32.3 g/dL (ref 30.0–36.0)
MCV: 74.8 fl — ABNORMAL LOW (ref 78.0–100.0)
Monocytes Absolute: 0.6 K/uL (ref 0.1–1.0)
Monocytes Relative: 4.8 % (ref 3.0–12.0)
Neutro Abs: 9.3 K/uL — ABNORMAL HIGH (ref 1.4–7.7)
Neutrophils Relative %: 74.9 % (ref 43.0–77.0)
Platelets: 380 K/uL (ref 150.0–400.0)
RBC: 4.32 Mil/uL (ref 3.87–5.11)
RDW: 23.3 % — ABNORMAL HIGH (ref 11.5–15.5)
WBC: 12.4 K/uL — ABNORMAL HIGH (ref 4.0–10.5)

## 2013-09-12 LAB — IBC PANEL
Iron: 46 ug/dL (ref 42–145)
Saturation Ratios: 10 % — ABNORMAL LOW (ref 20.0–50.0)
TRANSFERRIN: 329.6 mg/dL (ref 212.0–360.0)

## 2013-09-12 NOTE — Patient Instructions (Addendum)
check your blood sugar once a day.  vary the time of day when you check, between before the 3 meals, and at bedtime.  also check if you have symptoms of your blood sugar being too high or too low.  please keep a record of the readings and bring it to your next appointment here.  please call us sooner if your blood sugar goes below 70, or if you have a lot of readings over 200.  We need to know if the blood sugar is higher in the morning, or later in the day.  This will help [place] know if we need to increase the insulin in the morning, or if we need to add another shot later in the day.   Please call us next week, to tell us how your blood sugar is.  Please come back for a regular physical appointment in 2 months.   blood tests are being requested for you today.  We'll contact you with results.

## 2013-09-12 NOTE — Progress Notes (Signed)
Subjective:    Patient ID: Yvonne Reynolds, female    DOB: 1946/01/08, 68 y.o.   MRN: [id number]  HPI Pt returns for f/u of insulin-requiring DM (dx'ed 1995, when she was noted to have glycosuria; she has mild if any neuropathy of the lower extremities, but she has associated retinopathy and nephropathy; she has been on insulin since approx 1999; she has never had severe hypoglycemia, pancreatitis, or DKA; she chose a simple and inexpensive bid premixed insulin).  She says she never misses the insulin.  Despite increasing the insulin to 80 units qam, she says cbg's vary from 120-200.  She checks in am only.   She denies BRBPR.  She takes fe, 1 tab, twice a day.  Fatigue is improved.   Past Medical History  Diagnosis Date  . 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 DIABETES MELLITUS, TYPE II 12/21/2006  . DIABETIC  RETINOPATHY 12/22/2006  . HYPERLIPIDEMIA 12/22/2006  . DEPRESSION 06/26/2006  . OBSTRUCTIVE SLEEP APNEA 12/22/2006    no CPAP  . HYPERTENSION 06/26/2006  . ALLERGIC RHINITIS 12/22/2006  . CHRONIC OBSTRUCTIVE PULMONARY DISEASE, ACUTE EXACERBATION 08/24/2007    pt denies 03/28/2013 sd  . Reflux esophagitis 12/22/2006  . GERD 06/26/2006    with EE  . BARRETTS ESOPHAGUS 12/22/2006    without dysplasia  . HIATAL HERNIA 12/22/2006  . OSTEOARTHRITIS 06/26/2006  . OSTEOPOROSIS 06/26/2006    Past Surgical History  Procedure Laterality Date  . Abdominal hysterectomy    . Esophagogastroduodenoscopy  05/07/2004  . Cesarean section    . Wrist surgery      s/p left  after fall  . Incontinence surgery      s/p  . Orif ankle fracture Left 07/23/2012    Procedure: OPEN REDUCTION INTERNAL FIXATION (ORIF) ANKLE FRACTURE;  Surgeon: Jessy Oto, MD;  Location: [facility];  Service: Orthopedics;  Laterality: Left;    History   Social History  . Marital Status: Widowed    Spouse Name: N/A    Number of Children: 3  . Years of  Education: N/A   Occupational History  . 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 Smoking status: Former Smoker -- 1.00 packs/day    Types: Cigarettes    Quit date: 11/11/2011  . Smokeless tobacco: Never Used  . Alcohol Use: No  . Drug Use: No  . Sexual Activity: Not on file   Other Topics Concern  . Not on file   Social History Narrative   Widowed   Daily Caffeine Use-2 cups daily or more   Pt does not get regular exercise   Works [employer], 3rd shift    Current Outpatient Prescriptions on File Prior to Visit  Medication Sig Dispense Refill  . calcium carbonate (OS-CAL) 600 MG TABS Take 1,200 mg by mouth daily.       . citalopram (CELEXA) 20 MG tablet Take 1 tablet (20 mg total) by mouth 2 (two) times daily.  180 tablet  prn  . glucose blood (ONE TOUCH ULTRA TEST) test strip Two times a day dx 250.01       . insulin NPH-regular Human (NOVOLIN 70/30) (70-30) 100 UNIT/ML injection Inject 80 Units into the skin daily with breakfast.       . omeprazole (PRILOSEC) 40 MG capsule Take 1 capsule (40 mg total) by mouth 2 (two) times daily.  [place]  capsule  11  . Omeprazole-Sodium Bicarbonate (ZEGERID OTC PO) Take 40 mg by mouth daily.      Marland Kitchen RELION INSULIN SYR 0.5CC/29G 29G X 1/2" 0.5 ML MISC USE AS DIRECTED  100 each  3  . simvastatin (ZOCOR) 80 MG tablet Take 0.5 tablets (40 mg total) by mouth at bedtime.  45 tablet  3  . sucralfate (CARAFATE) 1 G tablet Take 1 tablet (1 g total) by mouth 4 (four) times daily -  with meals and at bedtime.  30 tablet  3  . triamcinolone cream (KENALOG) 0.1 % Apply topically 3 (three) times daily. As needed for itching  30 g  3  . vitamin D, CHOLECALCIFEROL, 400 UNITS tablet Take 400 Units by mouth daily.      . vitamin E (VITAMIN E) 400 UNIT capsule Take 400 Units by mouth daily.      . [DISCONTINUED] oxybutynin (DITROPAN) 5 MG tablet Take 5 mg by mouth 2 (two) times daily.         No current  facility-administered medications on file prior to visit.    Allergies  Allergen Reactions  . Procaine Hcl     REACTION: hallucinations    Family History  Problem Relation Age of Onset  . Cancer Mother     Ovarian Cancer  . Stroke Father   . Diabetes Sister   . Stroke Sister   . Diabetes Brother   . Diabetes Sister   . Hypertension Other   . Hyperlipidemia Other   . Colon cancer Neg Hx   . Pancreatic cancer Neg Hx   . Stomach cancer Neg Hx     BP 128/62  Pulse 87  Temp(Src) 97.9 F (36.6 C) (Oral)  Ht 5\' 1"  (1.549 m)  Wt 157 lb (71.215 kg)  BMI 29.68 kg/m2  SpO2 98%   Review of Systems She denies hypoglycemia.  She has lost a few lbs.      Objective:   Physical Exam VITAL SIGNS:  See vs page GENERAL: no distress SKIN:  Insulin injection sites at the anterior abdomen are normal  Lab Results  Component Value Date   WBC 12.4* 09/12/2013   HGB 10.4* 09/12/2013   HCT 32.3* 09/12/2013   MCV 74.8* 09/12/2013   PLT 380.0 09/12/2013       Assessment & Plan:  Anemia: improved, but it persists DM: moderate exacerbation Noncompliance with cbg recording (she checks in am only): I'll work around this as best I can   Patient is advised the following: Patient Instructions  check your blood sugar once a day.  vary the time of day when you check, between before the 3 meals, and at bedtime.  also check if you have symptoms of your blood sugar being too high or too low.  please keep a record of the readings and bring it to your next appointment here.  please call us sooner if your blood sugar goes below 70, or if you have a lot of readings over 200.  We need to know if the blood sugar is higher in the morning, or later in the day.  This will help [place] know if we need to increase the insulin in the morning, or if we need to add another shot later in the day.   Please call us next week, to tell us how your blood sugar is.  Please come back for a regular physical appointment in 2  months.   blood tests are being requested for you  today.  We'll contact you with results.  Please continue the same iron.

## 2013-09-20 ENCOUNTER — Encounter: Payer: Self-pay | Admitting: Gastroenterology

## 2013-11-15 ENCOUNTER — Encounter: Payer: Self-pay | Admitting: Endocrinology

## 2013-11-15 ENCOUNTER — Ambulatory Visit (INDEPENDENT_AMBULATORY_CARE_PROVIDER_SITE_OTHER): Payer: Commercial Managed Care - HMO | Admitting: Endocrinology

## 2013-11-15 VITALS — BP 134/78 | HR 93 | Temp 98.2°F | Ht 61.0 in | Wt 163.0 lb

## 2013-11-15 DIAGNOSIS — E1021 Type 1 diabetes mellitus with diabetic nephropathy: Secondary | ICD-10-CM | POA: Diagnosis not present

## 2013-11-15 DIAGNOSIS — Z0000 Encounter for general adult medical examination without abnormal findings: Secondary | ICD-10-CM

## 2013-11-15 DIAGNOSIS — M810 Age-related osteoporosis without current pathological fracture: Secondary | ICD-10-CM

## 2013-11-15 DIAGNOSIS — D509 Iron deficiency anemia, unspecified: Secondary | ICD-10-CM | POA: Insufficient documentation

## 2013-11-15 DIAGNOSIS — Z23 Encounter for immunization: Secondary | ICD-10-CM

## 2013-11-15 LAB — IBC PANEL
IRON: 51 ug/dL (ref 42–145)
Saturation Ratios: 12.7 % — ABNORMAL LOW (ref 20.0–50.0)
TRANSFERRIN: 287.6 mg/dL (ref 212.0–360.0)

## 2013-11-15 LAB — CBC WITH DIFFERENTIAL/PLATELET
Basophils Absolute: 0 10*3/uL (ref 0.0–0.1)
Basophils Relative: 0.3 % (ref 0.0–3.0)
EOS PCT: 1.2 % (ref 0.0–5.0)
Eosinophils Absolute: 0.1 10*3/uL (ref 0.0–0.7)
HCT: 37.9 % (ref 36.0–46.0)
Hemoglobin: 12.3 g/dL (ref 12.0–15.0)
Lymphocytes Relative: 24.8 % (ref 12.0–46.0)
Lymphs Abs: 2.4 10*3/uL (ref 0.7–4.0)
MCHC: 32.4 g/dL (ref 30.0–36.0)
MCV: 83.1 fl (ref 78.0–100.0)
Monocytes Absolute: 0.6 10*3/uL (ref 0.1–1.0)
Monocytes Relative: 6.2 % (ref 3.0–12.0)
NEUTROS PCT: 67.5 % (ref 43.0–77.0)
Neutro Abs: 6.6 10*3/uL (ref 1.4–7.7)
PLATELETS: 283 10*3/uL (ref 150.0–400.0)
RBC: 4.56 Mil/uL (ref 3.87–5.11)
RDW: 17.5 % — AB (ref 11.5–15.5)
WBC: 9.8 10*3/uL (ref 4.0–10.5)

## 2013-11-15 LAB — HEMOGLOBIN A1C: HEMOGLOBIN A1C: 10 % — AB (ref 4.6–6.5)

## 2013-11-15 MED ORDER — GLUCOSE BLOOD VI STRP: 1.0000 | ORAL_STRIP | Freq: Two times a day (BID) | Status: DC

## 2013-11-15 MED ORDER — DOXYCYCLINE HYCLATE 100 MG PO TABS: 100.0000 mg | ORAL_TABLET | Freq: Two times a day (BID) | ORAL | Status: DC

## 2013-11-15 MED ORDER — INSULIN NPH ISOPHANE & REGULAR (70-30) 100 UNIT/ML ~~LOC~~ SUSP: 80.0000 [IU] | Freq: Every day | SUBCUTANEOUS | Status: DC

## 2013-11-15 NOTE — Patient Instructions (Addendum)
blood tests are being requested for you today.  We'll contact you with results. please consider these measures for your health:  minimize alcohol.  do not use tobacco products.  have a colonoscopy at least every 10 years from age 66.  Women should have an annual mammogram from age 48.  keep firearms safely stored.  always use seat belts.  have working smoke alarms in your home.  see an eye doctor and dentist regularly.  never drive under the influence of alcohol or drugs (including prescription drugs).  those with fair skin should take precautions against the sun.   Please increase the citalopram to 1 pill, twice a day.   Please come back for a follow-up appointment in 3 months.   i have sent a prescription to your pharmacy, for an antibiotic pill, for the rash.

## 2013-11-15 NOTE — Progress Notes (Signed)
Subjective:    Patient ID: Yvonne Reynolds, female    DOB: 1945/09/06, 68 y.o.   MRN: [id number]  HPI Pt is here for regular wellness examination, and is feeling pretty well in general, and says chronic med probs are stable, except as noted below Past Medical History  Diagnosis Date  . 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 DIABETES MELLITUS, TYPE II 12/21/2006  . DIABETIC  RETINOPATHY 12/22/2006  . HYPERLIPIDEMIA 12/22/2006  . DEPRESSION 06/26/2006  . OBSTRUCTIVE SLEEP APNEA 12/22/2006    no CPAP  . HYPERTENSION 06/26/2006  . ALLERGIC RHINITIS 12/22/2006  . CHRONIC OBSTRUCTIVE PULMONARY DISEASE, ACUTE EXACERBATION 08/24/2007    pt denies 03/28/2013 sd  . Reflux esophagitis 12/22/2006  . GERD 06/26/2006    with EE  . BARRETTS ESOPHAGUS 12/22/2006    without dysplasia  . HIATAL HERNIA 12/22/2006  . OSTEOARTHRITIS 06/26/2006  . OSTEOPOROSIS 06/26/2006    Past Surgical History  Procedure Laterality Date  . Abdominal hysterectomy    . Esophagogastroduodenoscopy  05/07/2004  . Cesarean section    . Wrist surgery      s/p left  after fall  . Incontinence surgery      s/p  . Orif ankle fracture Left 07/23/2012    Procedure: OPEN REDUCTION INTERNAL FIXATION (ORIF) ANKLE FRACTURE;  Surgeon: Jessy Oto, MD;  Location: [facility];  Service: Orthopedics;  Laterality: Left;    History   Social History  . Marital Status: Widowed    Spouse Name: N/A    Number of Children: 3  . Years of Education: N/A   Occupational History  . 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 Smoking status: Former Smoker -- 1.00 packs/day    Types: Cigarettes    Quit date: 11/11/2011  . Smokeless tobacco: Never Used  . Alcohol Use: No  . Drug Use: No  . Sexual Activity: Not on file   Other Topics Concern  . Not on file   Social History Narrative   Widowed   Daily Caffeine Use-2 cups  daily or more   Pt does not get regular exercise   Works [employer], 3rd shift    Current Outpatient Prescriptions on File Prior to Visit  Medication Sig Dispense Refill  . calcium carbonate (OS-CAL) 600 MG TABS Take 1,200 mg by mouth daily.       . citalopram (CELEXA) 20 MG tablet Take 1 tablet (20 mg total) by mouth 2 (two) times daily.  180 tablet  prn  . omeprazole (PRILOSEC) 40 MG capsule Take 1 capsule (40 mg total) by mouth 2 (two) times daily.  60 capsule  11  . Omeprazole-Sodium Bicarbonate (ZEGERID OTC PO) Take 40 mg by mouth daily.      Marland Kitchen RELION INSULIN SYR 0.5CC/29G 29G X 1/2" 0.5 ML MISC USE AS DIRECTED  100 each  3  . simvastatin (ZOCOR) 80 MG tablet Take 0.5 tablets (40 mg total) by mouth at bedtime.  45 tablet  3  . sucralfate (CARAFATE) 1 G tablet Take 1 tablet (1 g total) by mouth 4 (four) times daily -  with meals and at bedtime.  30 tablet  3  . triamcinolone cream (KENALOG) 0.1 % Apply topically 3 (three) times daily. As needed for itching  30 g  3  . vitamin D, CHOLECALCIFEROL, 400 UNITS tablet Take 400 Units by mouth daily.      Marland Kitchen  vitamin E (VITAMIN E) 400 UNIT capsule Take 400 Units by mouth daily.      . [DISCONTINUED] oxybutynin (DITROPAN) 5 MG tablet Take 5 mg by mouth 2 (two) times daily.         No current facility-administered medications on file prior to visit.    Allergies  Allergen Reactions  . Procaine Hcl     REACTION: hallucinations    Family History  Problem Relation Age of Onset  . Cancer Mother     Ovarian Cancer  . Stroke Father   . Diabetes Sister   . Stroke Sister   . Diabetes Brother   . Diabetes Sister   . Hypertension Other   . Hyperlipidemia Other   . Colon cancer Neg Hx   . Pancreatic cancer Neg Hx   . Stomach cancer Neg Hx     BP 134/78  Pulse 93  Temp(Src) 98.2 F (36.8 C) (Oral)  Ht 5\' 1"  (1.549 m)  Wt 163 lb (73.936 kg)  BMI 30.81 kg/m2  SpO2 94%     Review of Systems  Constitutional: Negative for fever.        Weight gain  HENT: Negative for hearing loss.   Eyes: Negative for visual disturbance.  Respiratory: Negative for shortness of breath.   Cardiovascular: Negative for chest pain.  Gastrointestinal: Negative for anal bleeding.  Endocrine: Negative for cold intolerance.  Genitourinary: Negative for hematuria.  Musculoskeletal: Positive for back pain.  Skin: Negative for wound.  Allergic/Immunologic: Positive for environmental allergies.  Neurological: Negative for syncope and numbness.  Hematological: Bruises/bleeds easily.  Psychiatric/Behavioral: Negative for hallucinations.       Objective:   Physical Exam VS: see vs page GEN: no distress HEAD: head: no deformity eyes: no periorbital swelling, no proptosis external nose and ears are normal mouth: no lesion seen NECK: supple, thyroid is not enlarged CHEST WALL: no deformity LUNGS:  Clear to auscultation BREASTS: sees gyn CV: reg rate and rhythm, no murmur ABD: abdomen is soft, nontender.  no hepatosplenomegaly.  not distended.  no hernia GENITALIA/RECTAL: sees gyn MUSCULOSKELETAL: muscle bulk and strength are grossly normal.  no obvious joint swelling.  gait is normal and steady EXTEMITIES: no deformity.  no ulcer on the feet.  feet are of normal color and temp.  no edema PULSES: dorsalis pedis intact bilat.  no carotid bruit NEURO:  cn 2-12 grossly intact.   readily moves all 4's.  sensation is intact to touch on the feet SKIN:  Normal texture and temperature.  No rash or suspicious lesion is visible.   NODES:  None palpable at the neck PSYCH: alert, well-oriented.  Does not appear anxious nor depressed.        Assessment & Plan:  Wellness visit today, with problems stable, except as noted. we discussed code status.  pt requests full code, but would not want to be started or maintained on artificial life-support measures if there was not a reasonable chance of recovery   SEPARATE EVALUATION FOLLOWS--EACH PROBLEM HERE  IS NEW, NOT RESPONDING TO TREATMENT, OR POSES SIGNIFICANT RISK TO THE PATIENT'S HEALTH: HISTORY OF THE PRESENT ILLNESS: She has a moderate rash on the face, but no assoc itching.  no cbg record, but states cbg's are high PAST MEDICAL HISTORY reviewed and up to date today REVIEW OF SYSTEMS: Depression persists (she has been taking celexa only qd).  Denies suicidal thoughts PHYSICAL EXAMINATION: VITAL SIGNS:  See vs page GENERAL: no distress Skin: patchy red rash on the  face.  LAB/XRAY RESULTS: i reviewed electrocardiogram Lab Results  Component Value Date   HGBA1C 10.0* 11/15/2013  IMPRESSION: DM: severe exacerbation Rash, new, uncertain etiology PLAN:  Please increase insulin to 90 units qd, with breakfast. Please come back for a follow-up appointment in 1 month. Please bring your cbg record i have sent a prescription to your pharmacy, for an antibiotic pill

## 2013-11-16 ENCOUNTER — Telehealth: Payer: Self-pay | Admitting: Endocrinology

## 2013-11-16 NOTE — Telephone Encounter (Signed)
Let the pt know it was $45 for her vaccine.

## 2013-11-16 NOTE — Telephone Encounter (Signed)
Will the shingle shot be covered? Yvonne Reynolds is going to call her back

## 2013-11-18 ENCOUNTER — Other Ambulatory Visit: Payer: Self-pay

## 2013-11-18 MED ORDER — "INSULIN SYRINGE 29G X 1/2"" 0.5 ML MISC": Status: DC

## 2013-11-23 ENCOUNTER — Telehealth: Payer: Self-pay | Admitting: Endocrinology

## 2013-11-23 NOTE — Telephone Encounter (Signed)
Patient that she havent heard anything about her diabetic supply's. And she need Lancets and testing strips

## 2013-11-24 ENCOUNTER — Ambulatory Visit (INDEPENDENT_AMBULATORY_CARE_PROVIDER_SITE_OTHER)
Admission: RE | Admit: 2013-11-24 | Discharge: 2013-11-24 | Disposition: A | Discharge status: Home / Self Care | Payer: Commercial Managed Care - HMO | Source: Ambulatory Visit | Attending: Endocrinology | Admitting: Endocrinology

## 2013-11-24 DIAGNOSIS — M810 Age-related osteoporosis without current pathological fracture: Secondary | ICD-10-CM

## 2013-11-24 NOTE — Telephone Encounter (Signed)
Unable to reach pt. Test strips and lancets have been sent to [facility]. 11/15/2013.

## 2013-11-29 ENCOUNTER — Other Ambulatory Visit: Payer: Self-pay | Admitting: Endocrinology

## 2013-11-29 DIAGNOSIS — M810 Age-related osteoporosis without current pathological fracture: Secondary | ICD-10-CM

## 2013-11-29 MED ORDER — GLUCOSE BLOOD VI STRP: 1.0000 | ORAL_STRIP | Freq: Two times a day (BID) | Status: DC

## 2013-11-29 MED ORDER — LISINOPRIL 10 MG PO TABS: 10.0000 mg | ORAL_TABLET | Freq: Every day | ORAL | Status: DC

## 2013-11-29 NOTE — Telephone Encounter (Signed)
Patient need refill of Lancets, test strip and Blood pressure medication Lisinopril.

## 2013-11-29 NOTE — Telephone Encounter (Signed)
please call patient: Is she taking this med?

## 2013-11-29 NOTE — Telephone Encounter (Signed)
See below. Refill for test strips and lancets sent to pharmacy. I could not locate Lisinopril on pt's medication list. Should this med be refilled? Thank

## 2013-11-29 NOTE — Telephone Encounter (Signed)
Pt advised that rx has been sent

## 2013-11-29 NOTE — Telephone Encounter (Signed)
Ok, i have sent a prescription to your pharmacy  

## 2013-11-29 NOTE — Telephone Encounter (Signed)
Contacted pt. She states that she has been taking lisinopril 10 mg for about 15 years now.  Please advise, Thanks!

## 2013-12-07 ENCOUNTER — Other Ambulatory Visit (INDEPENDENT_AMBULATORY_CARE_PROVIDER_SITE_OTHER): Payer: Commercial Managed Care - HMO

## 2013-12-07 DIAGNOSIS — M810 Age-related osteoporosis without current pathological fracture: Secondary | ICD-10-CM

## 2013-12-07 LAB — BASIC METABOLIC PANEL
BUN: 25 mg/dL — ABNORMAL HIGH (ref 6–23)
CHLORIDE: 104 meq/L (ref 96–112)
CO2: 29 mEq/L (ref 19–32)
CREATININE: 1 mg/dL (ref 0.4–1.2)
Calcium: 9.4 mg/dL (ref 8.4–10.5)
GFR: 58.18 mL/min — ABNORMAL LOW (ref >60.00)
Glucose, Bld: 125 mg/dL — ABNORMAL HIGH (ref 70–99)
POTASSIUM: 4.8 meq/L (ref 3.5–5.1)
Sodium: 141 mEq/L (ref 135–145)

## 2014-02-15 ENCOUNTER — Ambulatory Visit (INDEPENDENT_AMBULATORY_CARE_PROVIDER_SITE_OTHER): Payer: Commercial Managed Care - HMO | Admitting: Endocrinology

## 2014-02-15 ENCOUNTER — Encounter: Payer: Self-pay | Admitting: Endocrinology

## 2014-02-15 VITALS — BP 126/86 | HR 89 | Temp 98.6°F | Ht 61.0 in | Wt 159.0 lb

## 2014-02-15 DIAGNOSIS — Z119 Encounter for screening for infectious and parasitic diseases, unspecified: Secondary | ICD-10-CM

## 2014-02-15 DIAGNOSIS — E1021 Type 1 diabetes mellitus with diabetic nephropathy: Secondary | ICD-10-CM | POA: Diagnosis not present

## 2014-02-15 LAB — HEMOGLOBIN A1C: HEMOGLOBIN A1C: 12.4 % — AB (ref 4.6–6.5)

## 2014-02-15 MED ORDER — VENLAFAXINE HCL ER 37.5 MG PO CP24: 37.5000 mg | ORAL_CAPSULE | Freq: Every day | ORAL | Status: DC

## 2014-02-15 NOTE — Progress Notes (Signed)
Subjective:    Patient ID: Yvonne Reynolds, female    DOB: [birth year], 69 y.o.   MRN: [id number]  HPI  Pt returns for f/u of diabetes mellitus: DM type: Insulin-requiring type 2 Dx'ed: 2355 Complications: retinopathy and nephropathy Therapy: insulin since 1999 GDM: never DKA: never Severe hypoglycemia: never Pancreatitis: never Other: she chose a simple and inexpensive insulin schedule Interval history: She takes only 40 units of insulin qam.  no cbg record, but states cbg's vary from 100-300.  It is in general higher as the day goes on.   Since on the citalopram, depression is improved, but she says she has "a funny feeling in my head."  Past Medical History  Diagnosis Date  . 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 DIABETES MELLITUS, TYPE II 12/21/2006  . DIABETIC  RETINOPATHY 12/22/2006  . HYPERLIPIDEMIA 12/22/2006  . DEPRESSION 06/26/2006  . OBSTRUCTIVE SLEEP APNEA 12/22/2006    no CPAP  . HYPERTENSION 06/26/2006  . ALLERGIC RHINITIS 12/22/2006  . CHRONIC OBSTRUCTIVE PULMONARY DISEASE, ACUTE EXACERBATION 08/24/2007    pt denies 03/28/2013 sd  . Reflux esophagitis 12/22/2006  . GERD 06/26/2006    with EE  . BARRETTS ESOPHAGUS 12/22/2006    without dysplasia  . HIATAL HERNIA 12/22/2006  . OSTEOARTHRITIS 06/26/2006  . OSTEOPOROSIS 06/26/2006    Past Surgical History  Procedure Laterality Date  . Abdominal hysterectomy    . Esophagogastroduodenoscopy  05/07/2004  . Cesarean section    . Wrist surgery      s/p left  after fall  . Incontinence surgery      s/p  . Orif ankle fracture Left 07/23/2012    Procedure: OPEN REDUCTION INTERNAL FIXATION (ORIF) ANKLE FRACTURE;  Surgeon: Jessy Oto, MD;  Location: [facility];  Service: Orthopedics;  Laterality: Left;    History   Social History  . Marital Status: Widowed    Spouse Name: N/A    Number of Children: 3  . Years of Education: N/A   Occupational  History  . 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 Smoking status: Former Smoker -- 1.00 packs/day    Types: Cigarettes    Quit date: 11/11/2011  . Smokeless tobacco: Never Used  . Alcohol Use: No  . Drug Use: No  . Sexual Activity: Not on file   Other Topics Concern  . Not on file   Social History Narrative   Widowed   Daily Caffeine Use-2 cups daily or more   Pt does not get regular exercise   Works [employer], 3rd shift    Current Outpatient Prescriptions on File Prior to Visit  Medication Sig Dispense Refill  . calcium carbonate (OS-CAL) 600 MG TABS Take 1,200 mg by mouth daily.     . citalopram (CELEXA) 20 MG tablet Take 1 tablet (20 mg total) by mouth 2 (two) times daily. 180 tablet prn  . glucose blood (ONE TOUCH ULTRA TEST) test strip 1 each by Other route 2 (two) times daily. And lancets 2 per day dx 250.01 180 each 3  . insulin NPH-regular Human (NOVOLIN 70/30) (70-30) 100 UNIT/ML injection Inject 90 Units into the skin daily with breakfast. And syringes 1/day    . INSULIN SYRINGE .5CC/29G (RELION INSULIN SYR 0.5CC/29G) 29G X 1/2" 0.5 ML MISC USE AS DIRECTED 100 each 3  . lisinopril (PRINIVIL,ZESTRIL) 10 MG tablet Take 1 tablet (10  mg total) by mouth daily. 90 tablet 3  . omeprazole (PRILOSEC) 40 MG capsule Take 1 capsule (40 mg total) by mouth 2 (two) times daily. 60 capsule 11  . Omeprazole-Sodium Bicarbonate (ZEGERID OTC PO) Take 40 mg by mouth daily.    . simvastatin (ZOCOR) 80 MG tablet Take 0.5 tablets (40 mg total) by mouth at bedtime. 45 tablet 3  . sucralfate (CARAFATE) 1 G tablet Take 1 tablet (1 g total) by mouth 4 (four) times daily -  with meals and at bedtime. 30 tablet 3  . triamcinolone cream (KENALOG) 0.1 % Apply topically 3 (three) times daily. As needed for itching 30 g 3  . vitamin D, CHOLECALCIFEROL, 400 UNITS tablet Take 400 Units by mouth daily.    . vitamin E (VITAMIN E) 400 UNIT capsule Take 400  Units by mouth daily.    . [DISCONTINUED] oxybutynin (DITROPAN) 5 MG tablet Take 5 mg by mouth 2 (two) times daily.       No current facility-administered medications on file prior to visit.    Allergies  Allergen Reactions  . Procaine Hcl     REACTION: hallucinations    Family History  Problem Relation Age of Onset  . Cancer Mother     Ovarian Cancer  . Stroke Father   . Diabetes Sister   . Stroke Sister   . Diabetes Brother   . Diabetes Sister   . Hypertension Other   . Hyperlipidemia Other   . Colon cancer Neg Hx   . Pancreatic cancer Neg Hx   . Stomach cancer Neg Hx     BP 126/86 mmHg  Pulse 89  Temp(Src) 98.6 F (37 C) (Oral)  Ht 5\' 1"  (1.549 m)  Wt 159 lb (72.122 kg)  BMI 30.06 kg/m2  SpO2 90%    Review of Systems She denies hypoglycemia.  She has lost 4 lbs since last ov.    Objective:   Physical Exam VITAL SIGNS:  See vs page GENERAL: no distress Pulses: dorsalis pedis intact bilat.   MSK: no deformity of the feet CV: no leg edema Skin:  no ulcer on the feet.  normal color and temp on the feet.   Neuro: sensation is intact to touch on the feet.     Lab Results  Component Value Date   HGBA1C 12.4* 02/15/2014      Assessment & Plan:  DM: severe exacerbation.  Side-effect of rx: "sensation" due to celexa.  Depression: stable.  i changed celexa to effexor.  Noncompliance with cbg recording and insulin dosing: I'll work around this as best I can.  We discussed need for med compliance.    Subjective:   Patient here for Medicare annual wellness visit and management of other chronic and acute problems.     Risk factors: advanced age    33 of Physicians Providing Medical Care to Patient:  See "snapshot"   Activities of Daily Living: In your present state of health, do you have any difficulty performing the following activities?:  Preparing food and eating?: No  Bathing yourself: No  Getting dressed: No  Using the toilet:No  Moving  around from place to place: No  In the past year have you fallen or had a near fall?: No    Home Safety: Has smoke detector and wears seat belts. No firearms. No excess sun exposure.  Diet and Exercise  Current exercise habits: pt says not much Dietary issues discussed: pt reports a healthy diet   Depression  Screen  Q1: Over the past two weeks, have you felt down, depressed or hopeless? mild Q2: Over the past two weeks, have you felt little interest or pleasure in doing things? no   The following portions of the patient's history were reviewed and updated as appropriate: allergies, current medications, past family history, past medical history, past social history, past surgical history and problem list.   Review of Systems  Denies hearing loss, and visual loss Objective:   Vision:  Sees opthalmologist Hearing: grossly normal Body mass index:  See vs page Msk: pt easily and quickly performs "get-up-and-go" from a sitting position Cognitive Impairment Assessment: cognition, memory and judgment appear normal.  remembers 3/3 at 5 minutes.  excellent recall.  can easily read and write a sentence.  alert and oriented x 3.     Assessment:   Medicare wellness utd on preventive parameters    Plan:   During the course of the visit the patient was educated and counseled about appropriate screening and preventive services including:        Fall prevention   Screening mammography  Bone densitometry screening  Diabetes screening  Nutrition counseling   Vaccines / LABS Zostavax / Pneumococcal Vaccine  today   Patient Instructions (the written plan) was given to the patient.

## 2014-02-15 NOTE — Patient Instructions (Addendum)
blood tests are being requested for you today.  We'll contact you with results.   Please come back for a follow-up appointment in 3 months.   check your blood sugar twice a day.  vary the time of day when you check, between before the 3 meals, and at bedtime.  also check if you have symptoms of your blood sugar being too high or too low.  please keep a record of the readings and bring it to your next appointment here.  You can write it on any piece of paper.  please call us sooner if your blood sugar goes below 70, or if you have a lot of readings over 200.   i have sent a prescription to your pharmacy, for a different pill against depression.  Please call if this does not help, so we can double it.   please consider these measures for your health:  minimize alcohol.  do not use tobacco products.  have a colonoscopy at least every 10 years from age 42.  Women should have an annual mammogram from age 29.  keep firearms safely stored.  always use seat belts.  have working smoke alarms in your home.  see an eye doctor and dentist regularly.  never drive under the influence of alcohol or drugs (including prescription drugs).  those with fair skin should take precautions against the sun. it is critically important to prevent falling down (keep floor areas well-lit, dry, and free of loose objects.  If you have a cane, walker, or wheelchair, you should use it, even for short trips around the house.  Also, try not to rush).

## 2014-02-15 NOTE — Progress Notes (Signed)
we discussed code status.  pt requests full code, but would not want to be started or maintained on artificial life-support measures if there was not a reasonable chance of recovery 

## 2014-02-16 LAB — HEPATITIS C ANTIBODY: HCV Ab: NEGATIVE

## 2014-02-28 DIAGNOSIS — H3582 Retinal ischemia: Secondary | ICD-10-CM | POA: Diagnosis not present

## 2014-02-28 DIAGNOSIS — E11351 Type 2 diabetes mellitus with proliferative diabetic retinopathy with macular edema: Secondary | ICD-10-CM | POA: Diagnosis not present

## 2014-05-17 ENCOUNTER — Ambulatory Visit (INDEPENDENT_AMBULATORY_CARE_PROVIDER_SITE_OTHER): Payer: Commercial Managed Care - HMO | Admitting: Endocrinology

## 2014-05-17 ENCOUNTER — Encounter: Payer: Self-pay | Admitting: Endocrinology

## 2014-05-17 VITALS — BP 134/70 | HR 92 | Temp 98.7°F | Ht 61.0 in | Wt 160.0 lb

## 2014-05-17 DIAGNOSIS — E1021 Type 1 diabetes mellitus with diabetic nephropathy: Secondary | ICD-10-CM | POA: Diagnosis not present

## 2014-05-17 LAB — HEMOGLOBIN A1C: Hgb A1c MFr Bld: 10.6 % — ABNORMAL HIGH (ref 4.6–6.5)

## 2014-05-17 MED ORDER — VENLAFAXINE HCL ER 75 MG PO CP24: 75.0000 mg | ORAL_CAPSULE | Freq: Every day | ORAL | Status: DC

## 2014-05-17 MED ORDER — DIAZEPAM 2 MG PO TABS: 2.0000 mg | ORAL_TABLET | Freq: Four times a day (QID) | ORAL | Status: DC | PRN

## 2014-05-17 MED ORDER — INSULIN REGULAR HUMAN 100 UNIT/ML IJ SOLN: 45.0000 [IU] | Freq: Every day | INTRAMUSCULAR | Status: DC

## 2014-05-17 MED ORDER — INSULIN NPH (HUMAN) (ISOPHANE) 100 UNIT/ML ~~LOC~~ SUSP: 45.0000 [IU] | Freq: Every day | SUBCUTANEOUS | Status: DC

## 2014-05-17 NOTE — Patient Instructions (Addendum)
blood tests are being requested for you today.  We'll contact you with results.   Please change your current insulin to 2 different injections (but both before breakfast), 45 units of each.  Please come back for a follow-up appointment in 3 months.   check your blood sugar twice a day.  vary the time of day when you check, between before the 3 meals, and at bedtime.  also check if you have symptoms of your blood sugar being too high or too low.  please keep a record of the readings and bring it to your next appointment here.  You can write it on any piece of paper.  please call us sooner if your blood sugar goes below 70, or if you have a lot of readings over 200.   i have sent a prescription to your pharmacy, to double the pill against depression.  Please call if this does not help, so we can double it again.  Here is a prescription for valium, to help with anxiety or sleep.

## 2014-05-17 NOTE — Progress Notes (Signed)
Subjective:    Patient ID: Yvonne Reynolds, female    DOB: 1945/11/07, 69 y.o.   MRN: [id number]  HPI Pt returns for f/u of diabetes mellitus: DM type: Insulin-requiring type 2 Dx'ed: 9518 Complications: retinopathy and nephropathy Therapy: insulin since 1999 GDM: never DKA: never Severe hypoglycemia: never Pancreatitis: never Other: she chose a simple and inexpensive insulin schedule Interval history: Pt says she now takes the full 90 units qam.  She has a new meter now.  no cbg record, but states cbg's vary from 90-500.  It is in general higher as the day goes on.  She says depression persists, due to her son's terminal cancer. She also reports anxiety and insomnia.   Past Medical History  Diagnosis Date  . 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 DIABETES MELLITUS, TYPE II 12/21/2006  . DIABETIC  RETINOPATHY 12/22/2006  . HYPERLIPIDEMIA 12/22/2006  . DEPRESSION 06/26/2006  . OBSTRUCTIVE SLEEP APNEA 12/22/2006    no CPAP  . HYPERTENSION 06/26/2006  . ALLERGIC RHINITIS 12/22/2006  . CHRONIC OBSTRUCTIVE PULMONARY DISEASE, ACUTE EXACERBATION 08/24/2007    pt denies 03/28/2013 sd  . Reflux esophagitis 12/22/2006  . GERD 06/26/2006    with EE  . BARRETTS ESOPHAGUS 12/22/2006    without dysplasia  . HIATAL HERNIA 12/22/2006  . OSTEOARTHRITIS 06/26/2006  . OSTEOPOROSIS 06/26/2006    Past Surgical History  Procedure Laterality Date  . Abdominal hysterectomy    . Esophagogastroduodenoscopy  05/07/2004  . Cesarean section    . Wrist surgery      s/p left  after fall  . Incontinence surgery      s/p  . Orif ankle fracture Left 07/23/2012    Procedure: OPEN REDUCTION INTERNAL FIXATION (ORIF) ANKLE FRACTURE;  Surgeon: Jessy Oto, MD;  Location: [facility];  Service: Orthopedics;  Laterality: Left;    History   Social History  . Marital Status: Widowed    Spouse Name: N/A  . Number of Children: 3  . Years of  Education: N/A   Occupational History  . 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 Smoking status: Former Smoker -- 1.00 packs/day    Types: Cigarettes    Quit date: 11/11/2011  . Smokeless tobacco: Never Used  . Alcohol Use: No  . Drug Use: No  . Sexual Activity: Not on file   Other Topics Concern  . Not on file   Social History Narrative   Widowed   Daily Caffeine Use-2 cups daily or more   Pt does not get regular exercise   Works [employer], 3rd shift    Current Outpatient Prescriptions on File Prior to Visit  Medication Sig Dispense Refill  . calcium carbonate (OS-CAL) 600 MG TABS Take 1,200 mg by mouth daily.     . citalopram (CELEXA) 20 MG tablet Take 1 tablet (20 mg total) by mouth 2 (two) times daily. 180 tablet prn  . glucose blood (ONE TOUCH ULTRA TEST) test strip 1 each by Other route 2 (two) times daily. And lancets 2 per day dx 250.01 180 each 3  . INSULIN SYRINGE .5CC/29G (RELION INSULIN SYR 0.5CC/29G) 29G X 1/2" 0.5 ML MISC USE AS DIRECTED 100 each 3  . lisinopril (PRINIVIL,ZESTRIL) 10 MG tablet Take 1 tablet (10 mg total) by mouth daily. 90 tablet 3  . omeprazole (PRILOSEC) 40 MG capsule Take 1 capsule (40 mg  total) by mouth 2 (two) times daily. 60 capsule 11  . Omeprazole-Sodium Bicarbonate (ZEGERID OTC PO) Take 40 mg by mouth daily.    . sucralfate (CARAFATE) 1 G tablet Take 1 tablet (1 g total) by mouth 4 (four) times daily -  with meals and at bedtime. 30 tablet 3  . triamcinolone cream (KENALOG) 0.1 % Apply topically 3 (three) times daily. As needed for itching 30 g 3  . vitamin D, CHOLECALCIFEROL, 400 UNITS tablet Take 400 Units by mouth daily.    . vitamin E (VITAMIN E) 400 UNIT capsule Take 400 Units by mouth daily.    . simvastatin (ZOCOR) 80 MG tablet Take 0.5 tablets (40 mg total) by mouth at bedtime. 45 tablet 3  . [DISCONTINUED] oxybutynin (DITROPAN) 5 MG tablet Take 5 mg by mouth 2 (two) times daily.        No current facility-administered medications on file prior to visit.    Allergies  Allergen Reactions  . Procaine Hcl     REACTION: hallucinations    Family History  Problem Relation Age of Onset  . Cancer Mother     Ovarian Cancer  . Stroke Father   . Diabetes Sister   . Stroke Sister   . Diabetes Brother   . Diabetes Sister   . Hypertension Other   . Hyperlipidemia Other   . Colon cancer Neg Hx   . Pancreatic cancer Neg Hx   . Stomach cancer Neg Hx     BP 134/70 mmHg  Pulse 92  Temp(Src) 98.7 F (37.1 C) (Oral)  Ht 5\' 1"  (1.549 m)  Wt 160 lb (72.576 kg)  BMI 30.25 kg/m2  SpO2 92%    Review of Systems She denies hypoglycemia and weight change.      Objective:   Physical Exam VITAL SIGNS:  See vs page.  GENERAL: no distress. Pulses: dorsalis pedis intact bilat.   MSK: no deformity of the feet.  CV: no leg edema.   Skin:  no ulcer on the feet.  normal color and temp on the feet. Neuro: sensation is intact to touch on the feet.   Ext: several toenails are ingrown, but no erythema/tend/drainage  Lab Results  Component Value Date   HGBA1C 10.6* 05/17/2014       Assessment & Plan:  DM: she needs increased rx, but based on the pattern of her cbg's, she needs more reg insulin, and less NPH Depression, exac by family illness.  She needs increased rx Insomnia/anxiety: worse.  Patient is advised the following: Patient Instructions  blood tests are being requested for you today.  We'll contact you with results.   Please change your current insulin to 2 different injections (but both before breakfast), 45 units of each.  Please come back for a follow-up appointment in 3 months.   check your blood sugar twice a day.  vary the time of day when you check, between before the 3 meals, and at bedtime.  also check if you have symptoms of your blood sugar being too high or too low.  please keep a record of the readings and bring it to your next appointment here.   You can write it on any piece of paper.  please call us sooner if your blood sugar goes below 70, or if you have a lot of readings over 200.   i have sent a prescription to your pharmacy, to double the pill against depression.  Please call if this does not help, so  we can double it again.  Here is a prescription for valium, to help with anxiety or sleep.

## 2014-05-29 ENCOUNTER — Telehealth: Payer: Self-pay | Admitting: Endocrinology

## 2014-05-29 MED ORDER — DIAZEPAM 5 MG PO TABS: 5.0000 mg | ORAL_TABLET | Freq: Three times a day (TID) | ORAL | Status: DC | PRN

## 2014-05-29 NOTE — Telephone Encounter (Signed)
i printed 

## 2014-05-29 NOTE — Telephone Encounter (Signed)
Rx faxed to pharmacy. Pt notified.

## 2014-05-29 NOTE — Telephone Encounter (Signed)
See note below and please advise, Thanks! 

## 2014-05-29 NOTE — Telephone Encounter (Signed)
Patient states that she is needing a rx called in  She states that Dr. Loanne Drilling has prescribed her 2 mg of valium and its not working  Due to the loss of her son she believes she needs 5 mg or 10 mg  [facility]: [facility] [address]   Please advise patient    Thank you

## 2014-05-30 ENCOUNTER — Telehealth: Payer: Self-pay | Admitting: Endocrinology

## 2014-05-30 ENCOUNTER — Telehealth: Payer: Self-pay | Admitting: Gastroenterology

## 2014-05-30 DIAGNOSIS — K2270 Barrett's esophagus without dysplasia: Secondary | ICD-10-CM

## 2014-05-30 DIAGNOSIS — K219 Gastro-esophageal reflux disease without esophagitis: Secondary | ICD-10-CM

## 2014-05-30 MED ORDER — SIMVASTATIN 80 MG PO TABS: 40.0000 mg | ORAL_TABLET | Freq: Every day | ORAL | Status: DC

## 2014-05-30 MED ORDER — OMEPRAZOLE 40 MG PO CPDR: 40.0000 mg | DELAYED_RELEASE_CAPSULE | Freq: Two times a day (BID) | ORAL | Status: DC

## 2014-05-30 NOTE — Telephone Encounter (Signed)
Rx sent per patients request.  

## 2014-05-30 NOTE — Telephone Encounter (Signed)
Prescription sent to patient's pharmacy. Left message letting patient know that I sent prescription to the pharmacy and she is due next month for follow up visit.

## 2014-05-30 NOTE — Telephone Encounter (Signed)
Patient need refill of simvastatin

## 2014-06-16 ENCOUNTER — Telehealth: Payer: Self-pay | Admitting: Endocrinology

## 2014-06-16 MED ORDER — VENLAFAXINE HCL ER 75 MG PO CP24: 75.0000 mg | ORAL_CAPSULE | Freq: Every day | ORAL | Status: DC

## 2014-06-16 NOTE — Telephone Encounter (Signed)
Rx sent per patients request.  

## 2014-06-16 NOTE — Telephone Encounter (Signed)
Patient would like to have her anti-depressant medication called in to mail order pharmacy  Rx: Effexor   Pharmacy: [facility] mail order  Thank you

## 2014-06-21 ENCOUNTER — Telehealth: Payer: Self-pay | Admitting: Endocrinology

## 2014-06-21 MED ORDER — GLUCOSE BLOOD VI STRP: ORAL_STRIP | Status: DC

## 2014-06-21 NOTE — Progress Notes (Signed)
This encounter was created in error - please disregard.  This encounter was created in error - please disregard.

## 2014-06-21 NOTE — Telephone Encounter (Signed)
Patient called stating she will be receiving a free accucheck nano and would like strips called in   Rx: Accu check Nano  Pharmacy: [facility]   Thank you

## 2014-06-21 NOTE — Telephone Encounter (Signed)
Rx sent per pt's request.  

## 2014-06-27 DIAGNOSIS — E11351 Type 2 diabetes mellitus with proliferative diabetic retinopathy with macular edema: Secondary | ICD-10-CM | POA: Diagnosis not present

## 2014-06-27 DIAGNOSIS — H3582 Retinal ischemia: Secondary | ICD-10-CM | POA: Diagnosis not present

## 2014-06-30 ENCOUNTER — Telehealth: Payer: Self-pay | Admitting: Gastroenterology

## 2014-06-30 DIAGNOSIS — K2270 Barrett's esophagus without dysplasia: Secondary | ICD-10-CM

## 2014-06-30 DIAGNOSIS — K219 Gastro-esophageal reflux disease without esophagitis: Secondary | ICD-10-CM

## 2014-06-30 MED ORDER — OMEPRAZOLE 40 MG PO CPDR: 40.0000 mg | DELAYED_RELEASE_CAPSULE | Freq: Two times a day (BID) | ORAL | Status: DC

## 2014-06-30 NOTE — Telephone Encounter (Signed)
Prescription sent to patient's pharmacy until scheduled appt. 

## 2014-08-14 ENCOUNTER — Other Ambulatory Visit: Payer: Self-pay

## 2014-08-15 ENCOUNTER — Telehealth: Payer: Self-pay | Admitting: Endocrinology

## 2014-08-15 NOTE — Telephone Encounter (Signed)
Pt calling to get a new rx for a new truemetrics meter called into [facility] along with the test strips

## 2014-08-15 NOTE — Telephone Encounter (Signed)
[facility] #  Pt to call back with number phone disconnected

## 2014-08-16 ENCOUNTER — Ambulatory Visit: Payer: Commercial Managed Care - HMO | Admitting: Endocrinology

## 2014-08-16 ENCOUNTER — Ambulatory Visit (INDEPENDENT_AMBULATORY_CARE_PROVIDER_SITE_OTHER): Payer: Commercial Managed Care - HMO | Admitting: Gastroenterology

## 2014-08-16 ENCOUNTER — Encounter: Payer: Self-pay | Admitting: Gastroenterology

## 2014-08-16 VITALS — BP 118/68 | HR 98 | Ht 61.0 in | Wt 159.0 lb

## 2014-08-16 DIAGNOSIS — K2100 Gastro-esophageal reflux disease with esophagitis, without bleeding: Secondary | ICD-10-CM

## 2014-08-16 DIAGNOSIS — Z1211 Encounter for screening for malignant neoplasm of colon: Secondary | ICD-10-CM | POA: Diagnosis not present

## 2014-08-16 DIAGNOSIS — K219 Gastro-esophageal reflux disease without esophagitis: Secondary | ICD-10-CM | POA: Diagnosis not present

## 2014-08-16 DIAGNOSIS — K2270 Barrett's esophagus without dysplasia: Secondary | ICD-10-CM

## 2014-08-16 MED ORDER — RANITIDINE HCL 300 MG PO TABS: 300.0000 mg | ORAL_TABLET | Freq: Every day | ORAL | Status: DC

## 2014-08-16 MED ORDER — OMEPRAZOLE 40 MG PO CPDR: 40.0000 mg | DELAYED_RELEASE_CAPSULE | Freq: Two times a day (BID) | ORAL | Status: DC

## 2014-08-16 NOTE — Telephone Encounter (Signed)
Pt did not return call with [facility] phone number

## 2014-08-16 NOTE — Progress Notes (Signed)
    History of Present Illness: This is a 69 year old female with a history of LA class C erosive esophagitis and Barrett's esophagus. She has been noncompliant with recommended follow-up in the past. However over the past year she states she has been taking her medications as prescribed and her symptoms have substantially improved. She still has frequent episodes of nocturnal regurgitation and burning.  Current Medications, Allergies, Past Medical History, Past Surgical History, Family History and Social History were reviewed in [employer] record.  Physical Exam: General: Well developed , well nourished, no acute distress Head: Normocephalic and atraumatic Eyes:  sclerae anicteric, EOMI Ears: Normal auditory acuity Mouth: No deformity or lesions Lungs: Clear throughout to auscultation Heart: Regular rate and rhythm; no murmurs, rubs or bruits Abdomen: Soft, non tender and non distended. No masses, hepatosplenomegaly or hernias noted. Normal Bowel sounds Musculoskeletal: Symmetrical with no gross deformities  Pulses:  Normal pulses noted Extremities: No clubbing, cyanosis, edema or deformities noted Neurological: Alert oriented x 4, grossly nonfocal Psychological:  Alert and cooperative. Normal mood and affect  Assessment and Recommendations:  1. LA class C erosive esophagitis and Barrett's esophagus. Symptoms under much better control but not adequately controlled. Continue all standard antireflux measures and continue omeprazole 40 mg twice daily before meals. Begin 4" bed blocks and ranitidine 300 mg hs. 3 year interval surveillance endoscopy recommended in June 2018.  2. CRC screening, average risk. Schedule colonoscopy. The risks (including bleeding, perforation, infection, missed lesions, medication reactions and possible hospitalization or surgery if complications occur), benefits, and alternatives to colonoscopy with possible biopsy and possible polypectomy  were discussed with the patient and they consent to proceed.

## 2014-08-16 NOTE — Patient Instructions (Addendum)
We have sent the following medications to your pharmacy for you to pick up at your convenience:omeprazole and zantac.  Patient advised to avoid spicy, acidic, citrus, chocolate, mints, fruit and fruit juices.  Limit the intake of caffeine, alcohol and Soda.  Don't exercise too soon after eating.  Don't lie down within 3-4 hours of eating.  Elevate the head of your bed.  You have been scheduled for a colonoscopy. Please follow written instructions given to you at your visit today.  Please pick up your prep supplies at the pharmacy within the next 1-3 days. If you use inhalers (even only as needed), please bring them with you on the day of your procedure. Your physician has requested that you go to www.startemmi.com and enter the access code given to you at your visit today. This web site gives a general overview about your procedure. However, you should still follow specific instructions given to you by our office regarding your preparation for the procedure.  Thank you for choosing me and [facility].  Pricilla Riffle. Dagoberto Ligas., MD., Marval Regal

## 2014-08-17 ENCOUNTER — Telehealth: Payer: Self-pay

## 2014-08-17 ENCOUNTER — Telehealth: Payer: Self-pay | Admitting: Endocrinology

## 2014-08-17 DIAGNOSIS — Z1211 Encounter for screening for malignant neoplasm of colon: Secondary | ICD-10-CM

## 2014-08-17 NOTE — Telephone Encounter (Signed)
Pt was asking Dr Loanne Drilling to put in a referral for her colonoscopy. Appears Dr. Loanne Drilling did it today after lunch. So this is completed.

## 2014-08-17 NOTE — Telephone Encounter (Signed)
Please refer pt over to GI for her colonoscopy. Thank you!

## 2014-08-17 NOTE — Telephone Encounter (Signed)
Please put in a GI referral for this pt.Due to her new insurance she will need this in order to be seen and for her colonoscopy on 10/11/2014

## 2014-08-17 NOTE — Telephone Encounter (Signed)
See below

## 2014-08-17 NOTE — Telephone Encounter (Signed)
Misunderstanding your message.The pt is an established pt over at GI with Dr.Stark.Spoke with Evelena Peat who works at GI to confirm this.What exactly needs to be done here?

## 2014-08-17 NOTE — Telephone Encounter (Signed)
done

## 2014-08-30 DIAGNOSIS — L0291 Cutaneous abscess, unspecified: Secondary | ICD-10-CM | POA: Diagnosis not present

## 2014-09-04 DIAGNOSIS — L0291 Cutaneous abscess, unspecified: Secondary | ICD-10-CM | POA: Diagnosis not present

## 2014-09-06 DIAGNOSIS — L0291 Cutaneous abscess, unspecified: Secondary | ICD-10-CM | POA: Diagnosis not present

## 2014-09-08 ENCOUNTER — Telehealth: Payer: Self-pay | Admitting: Endocrinology

## 2014-09-08 ENCOUNTER — Other Ambulatory Visit: Payer: Self-pay | Admitting: *Deleted

## 2014-09-08 DIAGNOSIS — L0291 Cutaneous abscess, unspecified: Secondary | ICD-10-CM | POA: Diagnosis not present

## 2014-09-08 MED ORDER — "INSULIN SYRINGE 29G X 1/2"" 0.5 ML MISC": Status: DC

## 2014-09-08 NOTE — Telephone Encounter (Signed)
Patient would like to talk to you about her surgery.

## 2014-09-08 NOTE — Telephone Encounter (Signed)
I contacted the pt. She stated she has recently gone to urgent and hade a cyst drained. Pt was advised by urgent care the cyst may need to be surgically removed. Pt is coming to the office on 8/16 to address this with Dr. Loanne Drilling.

## 2014-09-12 ENCOUNTER — Ambulatory Visit (INDEPENDENT_AMBULATORY_CARE_PROVIDER_SITE_OTHER): Payer: Commercial Managed Care - HMO | Admitting: Endocrinology

## 2014-09-12 ENCOUNTER — Encounter: Payer: Self-pay | Admitting: Endocrinology

## 2014-09-12 VITALS — BP 134/82 | HR 89 | Temp 98.0°F | Ht 61.0 in | Wt 160.0 lb

## 2014-09-12 DIAGNOSIS — E1021 Type 1 diabetes mellitus with diabetic nephropathy: Secondary | ICD-10-CM | POA: Diagnosis not present

## 2014-09-12 LAB — HEPATIC FUNCTION PANEL
ALK PHOS: 90 U/L (ref 39–117)
ALT: 14 U/L (ref 0–35)
AST: 17 U/L (ref 0–37)
Albumin: 3.8 g/dL (ref 3.5–5.2)
Bilirubin, Direct: 0.1 mg/dL (ref 0.0–0.3)
TOTAL PROTEIN: 7.1 g/dL (ref 6.0–8.3)
Total Bilirubin: 0.2 mg/dL (ref 0.2–1.2)

## 2014-09-12 LAB — POCT GLYCOSYLATED HEMOGLOBIN (HGB A1C): Hemoglobin A1C: 10.5

## 2014-09-12 MED ORDER — INSULIN REGULAR HUMAN 100 UNIT/ML IJ SOLN: 70.0000 [IU] | Freq: Every day | INTRAMUSCULAR | Status: DC

## 2014-09-12 MED ORDER — INSULIN NPH (HUMAN) (ISOPHANE) 100 UNIT/ML ~~LOC~~ SUSP: 40.0000 [IU] | Freq: Every day | SUBCUTANEOUS | Status: DC

## 2014-09-12 MED ORDER — CEPHALEXIN 500 MG PO CAPS: 500.0000 mg | ORAL_CAPSULE | Freq: Three times a day (TID) | ORAL | Status: DC

## 2014-09-12 MED ORDER — TRUE METRIX AIR GLUCOSE METER W/DEVICE KIT: 1.0000 | PACK | Freq: Once | Status: AC

## 2014-09-12 MED ORDER — TERBINAFINE HCL 250 MG PO TABS: 250.0000 mg | ORAL_TABLET | Freq: Every day | ORAL | Status: DC

## 2014-09-12 NOTE — Patient Instructions (Addendum)
please take the 2 different injections (but both before breakfast), 40 units of N, and 70 units of R Please come back for a follow-up appointment in 3 months.   blood tests are requested for you today.  We'll let you know about the results. check your blood sugar twice a day.  vary the time of day when you check, between before the 3 meals, and at bedtime.  also check if you have symptoms of your blood sugar being too high or too low.  please keep a record of the readings and bring it to your next appointment here.  You can write it on any piece of paper.  please call us sooner if your blood sugar goes below 70, or if you have a lot of readings over 200.   i have sent a prescription to your pharmacy, for a pill for the toenail fungus.  i have sent also a prescription to your pharmacy, for an antibiotic.  I hope you feel better soon.  If you don't feel better by next week, please call back.  Please call sooner if you get worse.

## 2014-09-12 NOTE — Progress Notes (Signed)
Subjective:    Patient ID: Yvonne Reynolds, female    DOB: 29-Apr-1945, 69 y.o.   MRN: [id number]  HPI Pt returns for f/u of diabetes mellitus: DM type: Insulin-requiring type 2 Dx'ed: 3790 Complications: retinopathy and nephropathy Therapy: insulin since 1999 GDM: never DKA: never Severe hypoglycemia: never Pancreatitis: never Other: she chose a simple and inexpensive insulin schedule Interval history: no cbg record, but states cbg's 60-500.  It is in general higher as the day goes on.   She says depression persists, due to her son's recent death.  Xanax helps.  She also reports anxiety and insomnia.  Pt states 2 weeks of slight swelling at the xyphoid area, and assoc drainage.  She was seen at urgent care, who did i and d and rx'ed abx.  It is much better now, per pt.   Past Medical History  Diagnosis Date  . 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 DIABETES MELLITUS, TYPE II 12/21/2006  . DIABETIC  RETINOPATHY 12/22/2006  . HYPERLIPIDEMIA 12/22/2006  . DEPRESSION 06/26/2006  . OBSTRUCTIVE SLEEP APNEA 12/22/2006    no CPAP  . HYPERTENSION 06/26/2006  . ALLERGIC RHINITIS 12/22/2006  . CHRONIC OBSTRUCTIVE PULMONARY DISEASE, ACUTE EXACERBATION 08/24/2007    pt denies 03/28/2013 sd  . Reflux esophagitis 12/22/2006  . GERD 06/26/2006    with EE  . BARRETTS ESOPHAGUS 12/22/2006    without dysplasia  . HIATAL HERNIA 12/22/2006  . OSTEOARTHRITIS 06/26/2006  . OSTEOPOROSIS 06/26/2006    Past Surgical History  Procedure Laterality Date  . Abdominal hysterectomy    . Esophagogastroduodenoscopy  05/07/2004  . Cesarean section    . Wrist surgery      s/p left  after fall  . Incontinence surgery      s/p  . Orif ankle fracture Left 07/23/2012    Procedure: OPEN REDUCTION INTERNAL FIXATION (ORIF) ANKLE FRACTURE;  Surgeon: Jessy Oto, MD;  Location: [facility];  Service: Orthopedics;  Laterality: Left;    Social History    Social History  . Marital Status: Widowed    Spouse Name: N/A  . Number of Children: 3  . Years of Education: N/A   Occupational History  . 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 Smoking status: Former Smoker -- 1.00 packs/day    Types: Cigarettes    Quit date: 11/11/2011  . Smokeless tobacco: Never Used  . Alcohol Use: No  . Drug Use: No  . Sexual Activity: Not on file   Other Topics Concern  . Not on file   Social History Narrative   Widowed   Daily Caffeine Use-2 cups daily or more   Pt does not get regular exercise   Works [employer], 3rd shift    Current Outpatient Prescriptions on File Prior to Visit  Medication Sig Dispense Refill  . Alcohol Swabs (B-D SINGLE USE SWABS REGULAR) PADS     . Blood Glucose Calibration (TRUE METRIX LEVEL 3) HIGH SOLN     . calcium carbonate (OS-CAL) 600 MG TABS Take 1,200 mg by mouth daily.     . citalopram (CELEXA) 20 MG tablet Take 1 tablet (20 mg total) by mouth 2 (two) times daily. 180 tablet prn  . diazepam (VALIUM) 5 MG tablet Take 1 tablet (5 mg total) by mouth every 8 (eight) hours as needed for anxiety (or sleep). 30 tablet 1  .  glucose blood (ACCU-CHEK SMARTVIEW) test strip Use to check blood sugar 2 times per day. And Lancets 2/day Dx Code: E11.9 200 each 1  . INSULIN SYRINGE .5CC/29G (RELION INSULIN SYR 0.5CC/29G) 29G X 1/2" 0.5 ML MISC USE AS DIRECTED 100 each 0  . lisinopril (PRINIVIL,ZESTRIL) 10 MG tablet Take 1 tablet (10 mg total) by mouth daily. 90 tablet 3  . omeprazole (PRILOSEC) 40 MG capsule Take 1 capsule (40 mg total) by mouth 2 (two) times daily. 180 capsule 3  . ranitidine (ZANTAC) 300 MG tablet Take 1 tablet (300 mg total) by mouth at bedtime. 90 tablet 3  . simvastatin (ZOCOR) 80 MG tablet Take 0.5 tablets (40 mg total) by mouth at bedtime. 45 tablet 3  . sucralfate (CARAFATE) 1 G tablet Take 1 tablet (1 g total) by mouth 4 (four) times daily -  with meals  and at bedtime. 30 tablet 3  . TRUEPLUS LANCETS 28G MISC     . venlafaxine XR (EFFEXOR-XR) 75 MG 24 hr capsule Take 1 capsule (75 mg total) by mouth daily with breakfast. 90 capsule 1  . vitamin D, CHOLECALCIFEROL, 400 UNITS tablet Take 400 Units by mouth daily.    . vitamin E (VITAMIN E) 400 UNIT capsule Take 400 Units by mouth daily.     No current facility-administered medications on file prior to visit.    Allergies  Allergen Reactions  . Procaine Hcl     REACTION: hallucinations    Family History  Problem Relation Age of Onset  . Cancer Mother     Ovarian Cancer  . Stroke Father   . Diabetes Sister   . Stroke Sister   . Diabetes Brother   . Diabetes Sister   . Hypertension Other   . Hyperlipidemia Other   . Colon cancer Neg Hx   . Pancreatic cancer Neg Hx   . Stomach cancer Neg Hx     BP 134/82 mmHg  Pulse 89  Temp(Src) 98 F (36.7 C) (Oral)  Ht 5\' 1"  (1.549 m)  Wt 160 lb (72.576 kg)  BMI 30.25 kg/m2  SpO2 94%  Review of Systems Denies LOC and fever.  She says a toenail fell off, due to fungus.     Objective:   Physical Exam VITAL SIGNS:  See vs page GENERAL: no distress Chest: over the xyphoid, there is a 2 cm area of erythema and slight purulent drainage.   Pulses: dorsalis pedis intact bilat.   MSK: no deformity of the feet. CV: no leg edema.  Skin:  no ulcer on the feet.  normal color and temp on the feet. Neuro: sensation is intact to touch on the feet Ext: There is bilateral onychomycosis of the toenails, and right great toenail is absent. Psych: tearful   A1c=10.5%    Assessment & Plan:  DM: she needs increased rx Abscess, new to me Onychomycosis: pt wants to have rx now Bereavement/depression: she is advised to continue same valium.  Patient is advised the following: Patient Instructions  please take the 2 different injections (but both before breakfast), 40 units of N, and 70 units of R Please come back for a follow-up appointment in 3  months.   blood tests are requested for you today.  We'll let you know about the results. check your blood sugar twice a day.  vary the time of day when you check, between before the 3 meals, and at bedtime.  also check if you have symptoms of your blood sugar being  too high or too low.  please keep a record of the readings and bring it to your next appointment here.  You can write it on any piece of paper.  please call us sooner if your blood sugar goes below 70, or if you have a lot of readings over 200.   i have sent a prescription to your pharmacy, for a pill for the toenail fungus.  i have sent also a prescription to your pharmacy, for an antibiotic.  I hope you feel better soon.  If you don't feel better by next week, please call back.  Please call sooner if you get worse.

## 2014-09-13 ENCOUNTER — Telehealth: Payer: Self-pay | Admitting: Gastroenterology

## 2014-09-13 NOTE — Telephone Encounter (Signed)
Patient is wanting to know if she can have her colonoscopy with a cyst on her breast that is "infected".  She is advised that she should see her primary care and that as long as she does not have a fever ok to keep her appt.  She is scheduled for a colonoscopy for 10/11/14

## 2014-09-14 ENCOUNTER — Other Ambulatory Visit: Payer: Self-pay | Admitting: Endocrinology

## 2014-09-14 DIAGNOSIS — L02213 Cutaneous abscess of chest wall: Secondary | ICD-10-CM | POA: Insufficient documentation

## 2014-09-26 ENCOUNTER — Telehealth: Payer: Self-pay

## 2014-09-26 NOTE — Telephone Encounter (Signed)
Pt will get A1c recheck done on f/u 11/11/2014

## 2014-09-28 DIAGNOSIS — L723 Sebaceous cyst: Secondary | ICD-10-CM

## 2014-09-28 HISTORY — DX: Sebaceous cyst: L72.3

## 2014-09-29 ENCOUNTER — Other Ambulatory Visit: Payer: Self-pay | Admitting: Endocrinology

## 2014-10-06 DIAGNOSIS — L723 Sebaceous cyst: Secondary | ICD-10-CM | POA: Diagnosis not present

## 2014-10-11 ENCOUNTER — Encounter: Payer: Commercial Managed Care - HMO | Admitting: Gastroenterology

## 2014-10-12 ENCOUNTER — Encounter (HOSPITAL_BASED_OUTPATIENT_CLINIC_OR_DEPARTMENT_OTHER): Payer: Self-pay | Admitting: *Deleted

## 2014-10-12 ENCOUNTER — Telehealth: Payer: Self-pay | Admitting: Endocrinology

## 2014-10-12 DIAGNOSIS — G479 Sleep disorder, unspecified: Secondary | ICD-10-CM | POA: Insufficient documentation

## 2014-10-12 NOTE — Pre-Procedure Instructions (Signed)
To come for BMET 

## 2014-10-12 NOTE — Telephone Encounter (Signed)
please call patient: There is a ? of sleep apnea.  Please see a specialist.  you will receive a phone call, about a day and time for an appointment

## 2014-10-12 NOTE — Telephone Encounter (Signed)
Yvonne Reynolds, could you contact this pt and schedule this referral?  Thanks!

## 2014-10-12 NOTE — Progress Notes (Signed)
   10/12/14 1116  OBSTRUCTIVE SLEEP APNEA  Have you ever been diagnosed with sleep apnea through a sleep study? No  Do you snore loudly (loud enough to be heard through closed doors)?  1  Do you often feel tired, fatigued, or sleepy during the daytime (such as falling asleep during driving or talking to someone)? 1  Has anyone observed you stop breathing during your sleep? 0  Do you have, or are you being treated for high blood pressure? 1  BMI more than 35 kg/m2? 0  Age > 91 (1-yes) 1  Female Gender (Yes=1) 0  Obstructive Sleep Apnea Score 4  Score 5 or greater  Results sent to PCP (Dr. Renato Shin)

## 2014-10-13 ENCOUNTER — Encounter (HOSPITAL_BASED_OUTPATIENT_CLINIC_OR_DEPARTMENT_OTHER)
Admission: RE | Admit: 2014-10-13 | Discharge: 2014-10-13 | Disposition: A | Discharge status: Home / Self Care | Payer: Commercial Managed Care - HMO | Source: Ambulatory Visit | Attending: General Surgery | Admitting: General Surgery

## 2014-10-13 ENCOUNTER — Other Ambulatory Visit: Payer: Self-pay | Admitting: General Surgery

## 2014-10-13 DIAGNOSIS — J439 Emphysema, unspecified: Secondary | ICD-10-CM | POA: Diagnosis not present

## 2014-10-13 DIAGNOSIS — Z992 Dependence on renal dialysis: Secondary | ICD-10-CM | POA: Diagnosis not present

## 2014-10-13 DIAGNOSIS — G4730 Sleep apnea, unspecified: Secondary | ICD-10-CM | POA: Diagnosis not present

## 2014-10-13 DIAGNOSIS — Z21 Asymptomatic human immunodeficiency virus [HIV] infection status: Secondary | ICD-10-CM | POA: Diagnosis not present

## 2014-10-13 DIAGNOSIS — Z79891 Long term (current) use of opiate analgesic: Secondary | ICD-10-CM | POA: Diagnosis not present

## 2014-10-13 DIAGNOSIS — L723 Sebaceous cyst: Secondary | ICD-10-CM | POA: Diagnosis present

## 2014-10-13 DIAGNOSIS — C259 Malignant neoplasm of pancreas, unspecified: Secondary | ICD-10-CM | POA: Diagnosis not present

## 2014-10-13 DIAGNOSIS — N189 Chronic kidney disease, unspecified: Secondary | ICD-10-CM | POA: Diagnosis not present

## 2014-10-13 DIAGNOSIS — Z87891 Personal history of nicotine dependence: Secondary | ICD-10-CM | POA: Diagnosis not present

## 2014-10-13 DIAGNOSIS — I509 Heart failure, unspecified: Secondary | ICD-10-CM | POA: Diagnosis not present

## 2014-10-13 DIAGNOSIS — I129 Hypertensive chronic kidney disease with stage 1 through stage 4 chronic kidney disease, or unspecified chronic kidney disease: Secondary | ICD-10-CM | POA: Diagnosis not present

## 2014-10-13 DIAGNOSIS — L720 Epidermal cyst: Secondary | ICD-10-CM | POA: Diagnosis not present

## 2014-10-13 DIAGNOSIS — E119 Type 2 diabetes mellitus without complications: Secondary | ICD-10-CM | POA: Diagnosis not present

## 2014-10-13 DIAGNOSIS — Z9981 Dependence on supplemental oxygen: Secondary | ICD-10-CM | POA: Diagnosis not present

## 2014-10-13 DIAGNOSIS — K219 Gastro-esophageal reflux disease without esophagitis: Secondary | ICD-10-CM | POA: Diagnosis not present

## 2014-10-13 DIAGNOSIS — D649 Anemia, unspecified: Secondary | ICD-10-CM | POA: Diagnosis not present

## 2014-10-13 DIAGNOSIS — Z79899 Other long term (current) drug therapy: Secondary | ICD-10-CM | POA: Diagnosis not present

## 2014-10-13 DIAGNOSIS — E780 Pure hypercholesterolemia: Secondary | ICD-10-CM | POA: Diagnosis not present

## 2014-10-13 DIAGNOSIS — Z6830 Body mass index (BMI) 30.0-30.9, adult: Secondary | ICD-10-CM | POA: Diagnosis not present

## 2014-10-13 DIAGNOSIS — Z794 Long term (current) use of insulin: Secondary | ICD-10-CM | POA: Diagnosis not present

## 2014-10-13 LAB — BASIC METABOLIC PANEL
Anion gap: 7 (ref 5–15)
BUN: 20 mg/dL (ref 6–20)
CALCIUM: 9.3 mg/dL (ref 8.9–10.3)
CHLORIDE: 99 mmol/L — AB (ref 101–111)
CO2: 30 mmol/L (ref 22–32)
CREATININE: 1.05 mg/dL — AB (ref 0.44–1.00)
GFR calc Af Amer: >60 mL/min (ref >60)
GFR, EST NON AFRICAN AMERICAN: 54 mL/min — AB (ref >60)
Glucose, Bld: 315 mg/dL — ABNORMAL HIGH (ref 65–99)
Potassium: 5.1 mmol/L (ref 3.5–5.1)
SODIUM: 136 mmol/L (ref 135–145)

## 2014-10-16 ENCOUNTER — Telehealth: Payer: Self-pay | Admitting: Endocrinology

## 2014-10-16 NOTE — Telephone Encounter (Signed)
Ov next available 

## 2014-10-16 NOTE — Telephone Encounter (Signed)
Alisha from Dr Donne Hazel office [facility]) stated that patient has surgery tomorrow but Dr was apprehensive about the surgery because patient B/S reading was to high 315. So they are rescheduling her surgery until her B/S is controlled. Please advise  [phone]

## 2014-10-16 NOTE — Telephone Encounter (Signed)
See note below to be advised.  Thanks! 

## 2014-10-17 ENCOUNTER — Ambulatory Visit (HOSPITAL_BASED_OUTPATIENT_CLINIC_OR_DEPARTMENT_OTHER): Payer: Commercial Managed Care - HMO | Admitting: Anesthesiology

## 2014-10-17 ENCOUNTER — Encounter (HOSPITAL_BASED_OUTPATIENT_CLINIC_OR_DEPARTMENT_OTHER): Payer: Self-pay | Admitting: *Deleted

## 2014-10-17 ENCOUNTER — Encounter (HOSPITAL_BASED_OUTPATIENT_CLINIC_OR_DEPARTMENT_OTHER)
Admission: RE | Disposition: A | Discharge status: Home / Self Care | Payer: Self-pay | Source: Ambulatory Visit | Attending: General Surgery

## 2014-10-17 ENCOUNTER — Ambulatory Visit (HOSPITAL_BASED_OUTPATIENT_CLINIC_OR_DEPARTMENT_OTHER)
Admission: RE | Admit: 2014-10-17 | Discharge: 2014-10-17 | Disposition: A | Discharge status: Home / Self Care | Payer: Commercial Managed Care - HMO | Source: Ambulatory Visit | Attending: General Surgery | Admitting: General Surgery

## 2014-10-17 DIAGNOSIS — D649 Anemia, unspecified: Secondary | ICD-10-CM | POA: Insufficient documentation

## 2014-10-17 DIAGNOSIS — L720 Epidermal cyst: Secondary | ICD-10-CM | POA: Diagnosis not present

## 2014-10-17 DIAGNOSIS — N189 Chronic kidney disease, unspecified: Secondary | ICD-10-CM | POA: Diagnosis not present

## 2014-10-17 DIAGNOSIS — J439 Emphysema, unspecified: Secondary | ICD-10-CM | POA: Diagnosis not present

## 2014-10-17 DIAGNOSIS — I129 Hypertensive chronic kidney disease with stage 1 through stage 4 chronic kidney disease, or unspecified chronic kidney disease: Secondary | ICD-10-CM | POA: Insufficient documentation

## 2014-10-17 DIAGNOSIS — K219 Gastro-esophageal reflux disease without esophagitis: Secondary | ICD-10-CM | POA: Diagnosis not present

## 2014-10-17 DIAGNOSIS — Z79891 Long term (current) use of opiate analgesic: Secondary | ICD-10-CM | POA: Insufficient documentation

## 2014-10-17 DIAGNOSIS — G4730 Sleep apnea, unspecified: Secondary | ICD-10-CM | POA: Diagnosis not present

## 2014-10-17 DIAGNOSIS — I10 Essential (primary) hypertension: Secondary | ICD-10-CM | POA: Diagnosis not present

## 2014-10-17 DIAGNOSIS — I509 Heart failure, unspecified: Secondary | ICD-10-CM | POA: Diagnosis not present

## 2014-10-17 DIAGNOSIS — E119 Type 2 diabetes mellitus without complications: Secondary | ICD-10-CM | POA: Insufficient documentation

## 2014-10-17 DIAGNOSIS — C259 Malignant neoplasm of pancreas, unspecified: Secondary | ICD-10-CM | POA: Diagnosis not present

## 2014-10-17 DIAGNOSIS — E780 Pure hypercholesterolemia: Secondary | ICD-10-CM | POA: Insufficient documentation

## 2014-10-17 DIAGNOSIS — Z9981 Dependence on supplemental oxygen: Secondary | ICD-10-CM | POA: Insufficient documentation

## 2014-10-17 DIAGNOSIS — L723 Sebaceous cyst: Secondary | ICD-10-CM | POA: Diagnosis not present

## 2014-10-17 DIAGNOSIS — Z79899 Other long term (current) drug therapy: Secondary | ICD-10-CM | POA: Insufficient documentation

## 2014-10-17 DIAGNOSIS — Z21 Asymptomatic human immunodeficiency virus [HIV] infection status: Secondary | ICD-10-CM | POA: Insufficient documentation

## 2014-10-17 DIAGNOSIS — Z87891 Personal history of nicotine dependence: Secondary | ICD-10-CM | POA: Insufficient documentation

## 2014-10-17 DIAGNOSIS — Z6830 Body mass index (BMI) 30.0-30.9, adult: Secondary | ICD-10-CM | POA: Insufficient documentation

## 2014-10-17 DIAGNOSIS — Z992 Dependence on renal dialysis: Secondary | ICD-10-CM | POA: Insufficient documentation

## 2014-10-17 DIAGNOSIS — Z794 Long term (current) use of insulin: Secondary | ICD-10-CM | POA: Insufficient documentation

## 2014-10-17 HISTORY — DX: Nocturia: R35.1

## 2014-10-17 HISTORY — DX: Age-related osteoporosis without current pathological fracture: M81.0

## 2014-10-17 HISTORY — DX: Adverse effect of unspecified anesthetic, initial encounter: T41.45XA

## 2014-10-17 HISTORY — DX: Gastro-esophageal reflux disease without esophagitis: K21.9

## 2014-10-17 HISTORY — DX: Depression, unspecified: F32.A

## 2014-10-17 HISTORY — PX: MASS EXCISION: SHX2000

## 2014-10-17 HISTORY — DX: Reserved for inherently not codable concepts without codable children: IMO0001

## 2014-10-17 HISTORY — DX: Unspecified macular degeneration: H35.30

## 2014-10-17 HISTORY — DX: Long term (current) use of insulin: Z79.4

## 2014-10-17 HISTORY — DX: Sebaceous cyst: L72.3

## 2014-10-17 HISTORY — DX: Personal history of other specified conditions: Z87.898

## 2014-10-17 HISTORY — DX: Other complications of anesthesia, initial encounter: T88.59XA

## 2014-10-17 HISTORY — DX: Type 2 diabetes mellitus without complications: E11.9

## 2014-10-17 HISTORY — DX: Major depressive disorder, single episode, unspecified: F32.9

## 2014-10-17 HISTORY — DX: Essential (primary) hypertension: I10

## 2014-10-17 HISTORY — DX: Tinea unguium: B35.1

## 2014-10-17 HISTORY — DX: Other symptoms and signs involving the musculoskeletal system: R29.898

## 2014-10-17 HISTORY — DX: Unspecified osteoarthritis, unspecified site: M19.90

## 2014-10-17 HISTORY — DX: Pure hypercholesterolemia, unspecified: E78.00

## 2014-10-17 LAB — GLUCOSE, CAPILLARY
GLUCOSE-CAPILLARY: 226 mg/dL — AB (ref 65–99)
GLUCOSE-CAPILLARY: 227 mg/dL — AB (ref 65–99)

## 2014-10-17 LAB — POCT HEMOGLOBIN-HEMACUE: Hemoglobin: 12.5 g/dL (ref 12.0–15.0)

## 2014-10-17 SURGERY — EXCISION MASS
Anesthesia: Monitor Anesthesia Care | Site: Chest

## 2014-10-17 MED ORDER — FENTANYL CITRATE (PF) 100 MCG/2ML IJ SOLN
50.0000 ug | INTRAMUSCULAR | Status: DC | PRN
Start: 2014-10-17 — End: 2014-10-17

## 2014-10-17 MED ORDER — DEXAMETHASONE SODIUM PHOSPHATE 10 MG/ML IJ SOLN
INTRAMUSCULAR | Status: AC
  Filled 2014-10-17: qty 1

## 2014-10-17 MED ORDER — MIDAZOLAM HCL 5 MG/5ML IJ SOLN
INTRAMUSCULAR | Status: DC | PRN
  Administered 2014-10-17: 2 mg via INTRAVENOUS

## 2014-10-17 MED ORDER — MIDAZOLAM HCL 2 MG/2ML IJ SOLN: 1.0000 mg | INTRAMUSCULAR | Status: DC | PRN

## 2014-10-17 MED ORDER — PROPOFOL 10 MG/ML IV BOLUS
INTRAVENOUS | Status: AC
  Filled 2014-10-17: qty 20

## 2014-10-17 MED ORDER — ONDANSETRON HCL 4 MG/2ML IJ SOLN
INTRAMUSCULAR | Status: DC | PRN
  Administered 2014-10-17: 4 mg via INTRAVENOUS

## 2014-10-17 MED ORDER — MIDAZOLAM HCL 2 MG/2ML IJ SOLN
INTRAMUSCULAR | Status: AC
  Filled 2014-10-17: qty 4

## 2014-10-17 MED ORDER — CEFAZOLIN SODIUM-DEXTROSE 2-3 GM-% IV SOLR
INTRAVENOUS | Status: AC
  Filled 2014-10-17: qty 50

## 2014-10-17 MED ORDER — FENTANYL CITRATE (PF) 100 MCG/2ML IJ SOLN
INTRAMUSCULAR | Status: AC
  Filled 2014-10-17: qty 4

## 2014-10-17 MED ORDER — HYDROCODONE-ACETAMINOPHEN 10-325 MG PO TABS: 1.0000 | ORAL_TABLET | Freq: Four times a day (QID) | ORAL | Status: DC | PRN

## 2014-10-17 MED ORDER — ACETAMINOPHEN 325 MG PO TABS: 325.0000 mg | ORAL_TABLET | ORAL | Status: DC | PRN

## 2014-10-17 MED ORDER — GLYCOPYRROLATE 0.2 MG/ML IJ SOLN: 0.2000 mg | Freq: Once | INTRAMUSCULAR | Status: DC | PRN

## 2014-10-17 MED ORDER — FENTANYL CITRATE (PF) 100 MCG/2ML IJ SOLN: 25.0000 ug | INTRAMUSCULAR | Status: DC | PRN

## 2014-10-17 MED ORDER — LIDOCAINE HCL (PF) 1 % IJ SOLN
INTRAMUSCULAR | Status: AC
  Filled 2014-10-17: qty 30

## 2014-10-17 MED ORDER — BUPIVACAINE HCL (PF) 0.25 % IJ SOLN
INTRAMUSCULAR | Status: AC
  Filled 2014-10-17: qty 30

## 2014-10-17 MED ORDER — OXYCODONE HCL 5 MG PO TABS: 5.0000 mg | ORAL_TABLET | Freq: Once | ORAL | Status: DC | PRN

## 2014-10-17 MED ORDER — CEFAZOLIN SODIUM-DEXTROSE 2-3 GM-% IV SOLR
2.0000 g | INTRAVENOUS | Status: AC
  Administered 2014-10-17: 2 g via INTRAVENOUS

## 2014-10-17 MED ORDER — BACITRACIN ZINC 500 UNIT/GM EX OINT
TOPICAL_OINTMENT | CUTANEOUS | Status: AC
  Filled 2014-10-17: qty 28.35

## 2014-10-17 MED ORDER — OXYCODONE HCL 5 MG/5ML PO SOLN: 5.0000 mg | Freq: Once | ORAL | Status: DC | PRN

## 2014-10-17 MED ORDER — ACETAMINOPHEN 160 MG/5ML PO SOLN: 325.0000 mg | ORAL | Status: DC | PRN

## 2014-10-17 MED ORDER — LIDOCAINE HCL (CARDIAC) 20 MG/ML IV SOLN
INTRAVENOUS | Status: DC | PRN
  Administered 2014-10-17: 50 mg via INTRAVENOUS

## 2014-10-17 MED ORDER — LIDOCAINE-EPINEPHRINE (PF) 1 %-1:200000 IJ SOLN
INTRAMUSCULAR | Status: DC | PRN
  Administered 2014-10-17: 6 mL

## 2014-10-17 MED ORDER — FENTANYL CITRATE (PF) 100 MCG/2ML IJ SOLN
INTRAMUSCULAR | Status: DC | PRN
  Administered 2014-10-17: 100 ug via INTRAVENOUS

## 2014-10-17 MED ORDER — PHENYLEPHRINE HCL 10 MG/ML IJ SOLN
INTRAMUSCULAR | Status: DC | PRN
  Administered 2014-10-17: 40 ug via INTRAVENOUS

## 2014-10-17 MED ORDER — ONDANSETRON HCL 4 MG/2ML IJ SOLN
INTRAMUSCULAR | Status: AC
  Filled 2014-10-17: qty 2

## 2014-10-17 MED ORDER — PROPOFOL 10 MG/ML IV BOLUS
INTRAVENOUS | Status: DC | PRN
  Administered 2014-10-17: 50 mg via INTRAVENOUS
  Administered 2014-10-17: 150 mg via INTRAVENOUS

## 2014-10-17 MED ORDER — SCOPOLAMINE 1 MG/3DAYS TD PT72: 1.0000 | MEDICATED_PATCH | Freq: Once | TRANSDERMAL | Status: DC | PRN

## 2014-10-17 MED ORDER — LIDOCAINE HCL (CARDIAC) 20 MG/ML IV SOLN
INTRAVENOUS | Status: AC
  Filled 2014-10-17: qty 5

## 2014-10-17 MED ORDER — LIDOCAINE-EPINEPHRINE (PF) 1 %-1:200000 IJ SOLN
INTRAMUSCULAR | Status: AC
  Filled 2014-10-17: qty 30

## 2014-10-17 MED ORDER — LACTATED RINGERS IV SOLN
INTRAVENOUS | Status: DC
  Administered 2014-10-17 (×2): via INTRAVENOUS

## 2014-10-17 SURGICAL SUPPLY — 51 items
BLADE CLIPPER SURG (BLADE) IMPLANT
BLADE SURG 15 STRL LF DISP TIS (BLADE) ×1 IMPLANT
BLADE SURG 15 STRL SS (BLADE) ×3
CANISTER SUCT 1200ML W/VALVE (MISCELLANEOUS) IMPLANT
CHLORAPREP W/TINT 26ML (MISCELLANEOUS) ×1 IMPLANT
CLOSURE STERI-STRIP 1/2X4 (GAUZE/BANDAGES/DRESSINGS)
CLSR STERI-STRIP ANTIMIC 1/2X4 (GAUZE/BANDAGES/DRESSINGS) ×1 IMPLANT
COVER BACK TABLE 60X90IN (DRAPES) ×3 IMPLANT
COVER MAYO STAND STRL (DRAPES) ×3 IMPLANT
DECANTER SPIKE VIAL GLASS SM (MISCELLANEOUS) IMPLANT
DRAPE LAPAROTOMY 100X72 PEDS (DRAPES) ×3 IMPLANT
DRSG TEGADERM 4X4.75 (GAUZE/BANDAGES/DRESSINGS) IMPLANT
ELECT COATED BLADE 2.86 ST (ELECTRODE) IMPLANT
ELECT REM PT RETURN 9FT ADLT (ELECTROSURGICAL) ×3
ELECTRODE REM PT RTRN 9FT ADLT (ELECTROSURGICAL) ×1 IMPLANT
GAUZE PACKING IODOFORM 1/4X15 (GAUZE/BANDAGES/DRESSINGS) ×2 IMPLANT
GLOVE BIO SURGEON STRL SZ7 (GLOVE) ×3 IMPLANT
GLOVE BIOGEL PI IND STRL 7.0 (GLOVE) IMPLANT
GLOVE BIOGEL PI IND STRL 7.5 (GLOVE) ×1 IMPLANT
GLOVE BIOGEL PI INDICATOR 7.0 (GLOVE) ×2
GLOVE BIOGEL PI INDICATOR 7.5 (GLOVE) ×2
GLOVE ECLIPSE 6.5 STRL STRAW (GLOVE) ×2 IMPLANT
GLOVE EXAM NITRILE EXT CUFF MD (GLOVE) ×2 IMPLANT
GOWN STRL REUS W/ TWL LRG LVL3 (GOWN DISPOSABLE) ×3 IMPLANT
GOWN STRL REUS W/TWL LRG LVL3 (GOWN DISPOSABLE) ×6
LIQUID BAND (GAUZE/BANDAGES/DRESSINGS) ×1 IMPLANT
MARKER SKIN DUAL TIP RULER LAB (MISCELLANEOUS) ×1 IMPLANT
NDL HYPO 25X1 1.5 SAFETY (NEEDLE) ×1 IMPLANT
NEEDLE HYPO 25X1 1.5 SAFETY (NEEDLE) ×3 IMPLANT
NS IRRIG 1000ML POUR BTL (IV SOLUTION) ×2 IMPLANT
PACK BASIN DAY SURGERY FS (CUSTOM PROCEDURE TRAY) ×3 IMPLANT
PENCIL BUTTON HOLSTER BLD 10FT (ELECTRODE) ×3 IMPLANT
SLEEVE SCD COMPRESS KNEE MED (MISCELLANEOUS) ×1 IMPLANT
SPONGE GAUZE 4X4 12PLY STER LF (GAUZE/BANDAGES/DRESSINGS) IMPLANT
SUT ETHILON 2 0 FS 18 (SUTURE) ×4 IMPLANT
SUT MNCRL AB 4-0 PS2 18 (SUTURE) ×1 IMPLANT
SUT SILK 2 0 SH (SUTURE) IMPLANT
SUT VIC AB 2-0 SH 27 (SUTURE)
SUT VIC AB 2-0 SH 27XBRD (SUTURE) IMPLANT
SUT VICRYL 3-0 CR8 SH (SUTURE) ×2 IMPLANT
SUT VICRYL 4-0 PS2 18IN ABS (SUTURE) IMPLANT
SWAB COLLECTION DEVICE MRSA (MISCELLANEOUS) IMPLANT
SYR CONTROL 10ML LL (SYRINGE) ×3 IMPLANT
TOWEL OR 17X24 6PK STRL BLUE (TOWEL DISPOSABLE) ×3 IMPLANT
TOWEL OR NON WOVEN STRL DISP B (DISPOSABLE) ×1 IMPLANT
TRAY DSU PREP LF (CUSTOM PROCEDURE TRAY) ×2 IMPLANT
TUBE ANAEROBIC SPECIMEN COL (MISCELLANEOUS) IMPLANT
TUBE CONNECTING 20'X1/4 (TUBING)
TUBE CONNECTING 20X1/4 (TUBING) IMPLANT
UNDERPAD 30X30 (UNDERPADS AND DIAPERS) IMPLANT
YANKAUER SUCT BULB TIP NO VENT (SUCTIONS) IMPLANT

## 2014-10-17 NOTE — Op Note (Signed)
Preoperative diagnosis: sebaceous cyst, chest wall Postoperative diagnosis: same as above Procedure: excision of 2x3 cm sebaceous cyst Surgeon: Dr Serita Grammes Anes: general with lma EBL: minimal Specimen: sebaceous cyst and overlying skin to pathology Complications none Drains none dispo to pacu stable Sponge and needle counts correct  Indications: this is a 4 yof with previously infected subxyphoid sebaceous cyst desires excision. We discussed risks and benefits prior to beginning.  Procedure: After informed consent was obtained she was taken to the operating room. She was given antibiotics.  She was placed under general anesthesia without complication.  She was prepped and draped in the standard sterile surgical fashion.  A timeout was performed.    I made an elliptical incision that included the connection to the skin. This was very adherent to the skin but no further connection superiorly.  I removed the entire sebaceous cyst.  It was thin at the superior portion but appeared viable.  Hemostasis was obtained. I then closed this partially with 3-0 vicryl and 2-0 nylon suture. I packed the cavity with iodoform gauze.  A dressing was placed. She tolerated this well and was transferred to recovery stable.

## 2014-10-17 NOTE — Telephone Encounter (Signed)
Noted  

## 2014-10-17 NOTE — Anesthesia Preprocedure Evaluation (Signed)
Anesthesia Evaluation  Patient identified by MRN, date of birth, ID band Patient awake    Reviewed: Allergy & Precautions, NPO status , Patient's Chart, lab work & pertinent test results  History of Anesthesia Complications Negative for: history of anesthetic complications  Airway Mallampati: II  TM Distance: >3 FB Neck ROM: Full    Dental  (+) Missing, Edentulous Upper   Pulmonary neg shortness of breath, sleep apnea , neg COPD, neg recent URI, former smoker,    breath sounds clear to auscultation       Cardiovascular hypertension, Pt. on medications (-) angina(-) Past MI and (-) CHF (-) dysrhythmias  Rhythm:Regular     Neuro/Psych PSYCHIATRIC DISORDERS Depression  Neuromuscular disease    GI/Hepatic Neg liver ROS, GERD  Medicated and Controlled,  Endo/Other  diabetes, Type 2, Insulin DependentMorbid obesity  Renal/GU Renal InsufficiencyRenal disease     Musculoskeletal  (+) Arthritis ,   Abdominal   Peds  Hematology  (+) anemia ,   Anesthesia Other Findings   Reproductive/Obstetrics                             Anesthesia Physical Anesthesia Plan  ASA: III  Anesthesia Plan: MAC   Post-op Pain Management:    Induction: Intravenous  Airway Management Planned: Nasal Cannula  Additional Equipment: None  Intra-op Plan:   Post-operative Plan:   Informed Consent: I have reviewed the patients History and Physical, chart, labs and discussed the procedure including the risks, benefits and alternatives for the proposed anesthesia with the patient or authorized representative who has indicated his/her understanding and acceptance.   Dental advisory given  Plan Discussed with: CRNA and Surgeon  Anesthesia Plan Comments:         Anesthesia Quick Evaluation

## 2014-10-17 NOTE — Anesthesia Procedure Notes (Addendum)
Procedure Name: LMA Insertion Date/Time: 10/17/2014 7:44 AM Performed by: Toula Moos L Pre-anesthesia Checklist: Patient identified, Emergency Drugs available, Suction available and Patient being monitored Patient Re-evaluated:Patient Re-evaluated prior to inductionOxygen Delivery Method: Circle System Utilized Preoxygenation: Pre-oxygenation with 100% oxygen Intubation Type: IV induction Ventilation: Mask ventilation without difficulty LMA: LMA inserted LMA Size: 4.0 Number of attempts: 1 Airway Equipment and Method: Bite block Placement Confirmation: positive ETCO2 Tube secured with: Tape Dental Injury: Teeth and Oropharynx as per pre-operative assessment

## 2014-10-17 NOTE — H&P (Signed)
69 year old female who presents with a complaint of Mass. 75 yof with dm who presents with years long history of a xyphoid mass. this has been infected twice and drained before. she presents to discuss excision+   Other Problems Marjean Donna, CMA; 10/06/2014 9:25 AM) Alcohol Abuse Arthritis Atrial Fibrillation Back Pain Bladder Problems Cancer Cerebrovascular Accident Cervical Cancer Chest pain Chronic Obstructive Lung Disease Chronic Renal Failure Syndrome Colon Cancer Congestive Heart Failure Crohn's Disease Emphysema Of Lung Gastric Ulcer Gastroesophageal Reflux Disease Heart murmur Hemorrhoids Hepatitis High blood pressure HIV-positive Home Oxygen Use Hypercholesterolemia Inguinal Hernia Kidney Stone Lump In Breast Lung Cancer Migraine Headache Pancreatic Cancer Pulmonary Embolism / Blood Clot in Legs Seizure Disorder Sickle cell disease Sleep Apnea Thyroid Disease Transfusion history Umbilical Hernia Repair Vascular Disease Ventral Hernia Repair  Past Surgical History Marjean Donna, CMA; 10/06/2014 9:25 AM) Dialysis Shunt / Fistula  Diagnostic Studies History Marjean Donna, CMA; 10/06/2014 9:25 AM) Mammogram 1-3 years ago  Allergies Davy Pique Bynum, CMA; 10/06/2014 9:26 AM) Procaine *CHEMICALS*  Medication History (Sonya Bynum, CMA; 10/06/2014 9:27 AM) Hydrocodone-Acetaminophen (5-325MG Tablet, Oral as needed) Active. Diazepam (5MG Tablet, Oral) Active. BD Swab Single Use Regular Active. HumuLIN N (100UNIT/ML Suspension, Subcutaneous) Active. Lisinopril (10MG Tablet, Oral) Active. Omeprazole (40MG Capsule DR, Oral) Active. HumuLIN R (100UNIT/ML Solution, Injection) Active. Ranitidine HCl (300MG Tablet, Oral) Active. ReliOn Insulin Syringe (29G X 1/2"0.5 ML Misc,) Active. Simvastatin (80MG Tablet, Oral) Active. Terbinafine HCl (250MG Tablet, Oral) Active. Venlafaxine HCl ER (75MG Capsule ER 24HR, Oral)  Active. True Metrix Level 3 (High Solution, In Vitro) Active. True Metrix Air Glucose Meter (w/Device Kit,) Active. TRUEplus Lancets 28G Active. OneTouch Ultra Blue (In Vitro) Active. Medications Reconciled  Social History Marjean Donna, CMA; 10/06/2014 9:25 AM) Caffeine use Coffee. Illicit drug use Recently quit drug use. Tobacco use Former smoker.  Family History Marjean Donna, CMA; 10/06/2014 9:25 AM) Cancer Mother. Cerebrovascular Accident Father, Sister. Colon Polyps Family Members In General. Depression Brother, Family Members In General. Heart Disease Family Members In General. Hypertension Father.  Pregnancy / Birth History Marjean Donna, CMA; 10/06/2014 9:25 AM) Age of menopause 34-55 Maternal age 61-20  Review of Systems ([place]; 10/06/2014 9:25 AM) Respiratory Present- Snoring. Not Present- Bloody sputum, Chronic Cough, Difficulty Breathing and Wheezing. Cardiovascular Present- Swelling of Extremities. Not Present- Chest Pain, Difficulty Breathing Lying Down, Leg Cramps, Palpitations, Rapid Heart Rate and Shortness of Breath. Neurological Present- Trouble walking. Not Present- Decreased Memory, Fainting, Headaches, Numbness, Seizures, Tingling, Tremor and Weakness. Psychiatric Present- Anxiety. Not Present- Bipolar, Change in Sleep Pattern, Depression, Fearful and Frequent crying. Endocrine Present- Hot flashes. Not Present- Cold Intolerance, Excessive Hunger, Hair Changes, Heat Intolerance and New Diabetes. Hematology Present- Persistent Infections. Not Present- Easy Bruising, Excessive bleeding, Gland problems and HIV.   Vitals (Sonya Bynum CMA; 10/06/2014 9:25 AM) 10/06/2014 9:25 AM Weight: 158 lb Height: 61in Body Surface Area: 1.76 m Body Mass Index: 29.85 kg/m Temp.: 69F(Temporal)  Pulse: 79 (Regular)  BP: 126/80 (Sitting, Left Arm, Standard)    Physical Exam Rolm Bookbinder MD; 10/06/2014 9:51 AM) General Mental  Status-Alert. Orientation-Oriented X3.  Chest and Lung Exam Chest and lung exam reveals -on auscultation, normal breath sounds, no adventitious sounds and normal vocal resonance. Note: 2 x 3 subq mass at xyphoid with skin connection, not infected currently   Cardiovascular Cardiovascular examination reveals -normal heart sounds, regular rate and rhythm with no murmurs.    Assessment & Plan Rolm Bookbinder MD; 10/06/2014 9:42 AM) SEBACEOUS CYST (L72.3)  Current Plans

## 2014-10-17 NOTE — Telephone Encounter (Signed)
Left voice mail advising patient to call back and schedule office visit.  

## 2014-10-17 NOTE — Telephone Encounter (Signed)
Patient stated that she no longer an appt she will keep the appt she has.

## 2014-10-17 NOTE — Transfer of Care (Signed)
Immediate Anesthesia Transfer of Care Note  Patient: Yvonne Reynolds  Procedure(s) Performed: Procedure(s): EXCISION 2X3CM SUBCUTANEOUS CHEST WALL CYST (N/A)  Patient Location: PACU  Anesthesia Type:General  Level of Consciousness: awake and patient cooperative  Airway & Oxygen Therapy: Patient Spontanous Breathing and Patient connected to face mask oxygen  Post-op Assessment: Report given to RN and Post -op Vital signs reviewed and stable  Post vital signs: Reviewed and stable  Last Vitals:  Filed Vitals:   10/17/14 0631  BP: 174/67  Pulse: 93  Temp: 36.9 C  Resp: 18    Complications: No apparent anesthesia complications

## 2014-10-17 NOTE — Interval H&P Note (Signed)
History and Physical Interval Note:  10/17/2014 7:26 AM  Yvonne Reynolds  has presented today for surgery, with the diagnosis of SEBACEOUS CYST  The various methods of treatment have been discussed with the patient and family. After consideration of risks, benefits and other options for treatment, the patient has consented to  Procedure(s): EXCISION 2X3CM SUBCUTANEOUS CHEST WALL MASS (N/A) as a surgical intervention .  The patient's history has been reviewed, patient examined, no change in status, stable for surgery.  I have reviewed the patient's chart and labs.  Questions were answered to the patient's satisfaction.     WAKEFIELD,MATTHEW

## 2014-10-17 NOTE — Anesthesia Postprocedure Evaluation (Signed)
  Anesthesia Post-op Note  Patient: Yvonne Reynolds  Procedure(s) Performed: Procedure(s): EXCISION 2X3CM SUBCUTANEOUS CHEST WALL CYST (N/A)  Patient Location: PACU  Anesthesia Type:General  Level of Consciousness: awake  Airway and Oxygen Therapy: Patient Spontanous Breathing  Post-op Pain: none  Post-op Assessment: Post-op Vital signs reviewed, Patient's Cardiovascular Status Stable, Respiratory Function Stable, Patent Airway, No signs of Nausea or vomiting and Pain level controlled              Post-op Vital Signs: Reviewed and stable  Last Vitals:  Filed Vitals:   10/17/14 0838  BP: 159/71  Pulse:   Temp:   Resp:     Complications: No apparent anesthesia complications

## 2014-10-17 NOTE — Discharge Instructions (Signed)
Remove packing on Thursday then may cover.  May shower in 48 hours.     Post Anesthesia Home Care Instructions  Activity: Get plenty of rest for the remainder of the day. A responsible adult should stay with you for 24 hours following the procedure.  For the next 24 hours, DO NOT: -Drive a car -Paediatric nurse -Drink alcoholic beverages -Take any medication unless instructed by your physician -Make any legal decisions or sign important papers.  Meals: Start with liquid foods such as gelatin or soup. Progress to regular foods as tolerated. Avoid greasy, spicy, heavy foods. If nausea and/or vomiting occur, drink only clear liquids until the nausea and/or vomiting subsides. Call your physician if vomiting continues.  Special Instructions/Symptoms: Your throat may feel dry or sore from the anesthesia or the breathing tube placed in your throat during surgery. If this causes discomfort, gargle with warm salt water. The discomfort should disappear within 24 hours.  If you had a scopolamine patch placed behind your ear for the management of post- operative nausea and/or vomiting:  1. The medication in the patch is effective for 72 hours, after which it should be removed.  Wrap patch in a tissue and discard in the trash. Wash hands thoroughly with soap and water. 2. You may remove the patch earlier than 72 hours if you experience unpleasant side effects which may include dry mouth, dizziness or visual disturbances. 3. Avoid touching the patch. Wash your hands with soap and water after contact with the patch.

## 2014-10-18 ENCOUNTER — Encounter (HOSPITAL_BASED_OUTPATIENT_CLINIC_OR_DEPARTMENT_OTHER): Payer: Self-pay | Admitting: General Surgery

## 2014-12-13 ENCOUNTER — Ambulatory Visit (INDEPENDENT_AMBULATORY_CARE_PROVIDER_SITE_OTHER): Payer: Commercial Managed Care - HMO | Admitting: Endocrinology

## 2014-12-13 ENCOUNTER — Ambulatory Visit: Payer: Self-pay | Admitting: Endocrinology

## 2014-12-13 ENCOUNTER — Encounter: Payer: Self-pay | Admitting: Endocrinology

## 2014-12-13 VITALS — BP 132/70 | HR 94 | Temp 98.1°F | Ht 61.0 in | Wt 165.0 lb

## 2014-12-13 DIAGNOSIS — Z23 Encounter for immunization: Secondary | ICD-10-CM

## 2014-12-13 DIAGNOSIS — E1021 Type 1 diabetes mellitus with diabetic nephropathy: Secondary | ICD-10-CM

## 2014-12-13 LAB — POCT GLYCOSYLATED HEMOGLOBIN (HGB A1C): HEMOGLOBIN A1C: 10.5

## 2014-12-13 NOTE — Progress Notes (Signed)
Subjective:    Patient ID: Yvonne Reynolds, female    DOB: 11/09/1945, 69 y.o.   MRN: [id number]  HPI Pt returns for f/u of diabetes mellitus: DM type: Insulin-requiring type 2 Dx'ed: 1478 Complications: retinopathy and nephropathy.  Therapy: insulin since 1999 GDM: never DKA: never Severe hypoglycemia: never Pancreatitis: never.  Other: she chose a simple and inexpensive insulin schedule.   Interval history: she is several hrs late for her appt today.  no cbg record, but states cbg's 40-500.  It is in general higher as the day goes on.  She takes a widely varying insulin dosage, according to her fasting cbg.   She says depression is much improved.   Past Medical History  Diagnosis Date  . History of seizure     x 1 - due to hyperglycemia  . Macular degeneration   . High cholesterol   . Osteoporosis   . Nocturia   . Insulin dependent diabetes mellitus ([place])   . Sebaceous cyst 09/2014    chest wall - sternum  . Hypertension     states under control with med., has been on med. > 20 yr.  . Depression   . GERD (gastroesophageal reflux disease)   . Osteoarthritis     shoulder  . Popping of temporomandibular joint on opening of jaw   . Complication of anesthesia     states was hard to wake up after 1st EGD  . Toenail fungus     Past Surgical History  Procedure Laterality Date  . Abdominal hysterectomy      partial  . Esophagogastroduodenoscopy  05/07/2004  . Cesarean section    . Orif wrist fracture Left   . Orif ankle fracture Left 07/23/2012    Procedure: OPEN REDUCTION INTERNAL FIXATION (ORIF) ANKLE FRACTURE;  Surgeon: Jessy Oto, MD;  Location: [place];  Service: Orthopedics;  Laterality: Left;  . Esophagogastroduodenoscopy (egd) with propofol  07/06/2013  . Cataract extraction w/ intraocular lens  implant, bilateral Bilateral   . Bladder suspension    . Mass excision N/A 10/17/2014    Procedure: EXCISION 2X3CM SUBCUTANEOUS CHEST WALL CYST;  Surgeon: Rolm Bookbinder, MD;  Location: [facility];  Service: General;  Laterality: N/A;    Social History   Social History  . Marital Status: Widowed    Spouse Name: N/A  . Number of Children: 3  . Years of Education: N/A   Occupational History  . 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 Smoking status: Former Smoker -- 0.00 packs/day    Quit date: 11/11/2011  . Smokeless tobacco: Never Used  . Alcohol Use: No  . Drug Use: No  . Sexual Activity: Not on file   Other Topics Concern  . Not on file   Social History Narrative   Widowed   Daily Caffeine Use-2 cups daily or more   Pt does not get regular exercise   Works [employer], 3rd shift    Current Outpatient Prescriptions on File Prior to Visit  Medication Sig Dispense Refill  . Alcohol Swabs (B-D SINGLE USE SWABS REGULAR) PADS     . Blood Glucose Calibration (TRUE METRIX LEVEL 3) HIGH SOLN     . Blood Glucose Monitoring Suppl (TRUE METRIX AIR GLUCOSE METER) W/DEVICE KIT 1 Device by Other route once. 1 kit 0  . calcium carbonate (OS-CAL) 600 MG TABS Take 2,600 mg by mouth daily.     Marland Kitchen  glucose blood (ACCU-CHEK SMARTVIEW) test strip Use to check blood sugar 2 times per day. And Lancets 2/day Dx Code: E11.9 200 each 1  . insulin NPH Human (HUMULIN N,NOVOLIN N) 100 UNIT/ML injection Inject 30 Units into the skin daily before breakfast.    . insulin regular (NOVOLIN R,HUMULIN R) 100 units/mL injection Inject 25 Units into the skin daily with breakfast.    . lisinopril (PRINIVIL,ZESTRIL) 10 MG tablet TAKE 1 TABLET EVERY DAY 90 tablet 3  . omeprazole (PRILOSEC) 40 MG capsule Take 1 capsule (40 mg total) by mouth 2 (two) times daily. 180 capsule 3  . ranitidine (ZANTAC) 300 MG tablet Take 1 tablet (300 mg total) by mouth at bedtime. 90 tablet 3  . simvastatin (ZOCOR) 80 MG tablet Take 0.5 tablets (40 mg total) by mouth at bedtime. 45 tablet 3  . TRUEPLUS LANCETS 28G MISC     .  venlafaxine XR (EFFEXOR-XR) 75 MG 24 hr capsule Take 1 capsule (75 mg total) by mouth daily with breakfast. 90 capsule 1  . vitamin D, CHOLECALCIFEROL, 400 UNITS tablet Take 400 Units by mouth daily.    . vitamin E (VITAMIN E) 400 UNIT capsule Take 400 Units by mouth daily.     No current facility-administered medications on file prior to visit.    Allergies  Allergen Reactions  . Adhesive [Tape] Rash    SKIN IRRITATION    Family History  Problem Relation Age of Onset  . Ovarian cancer Mother   . Stroke Father   . Diabetes Sister   . Stroke Sister   . Diabetes Brother   . Diabetes Sister     BP 132/70 mmHg  Pulse 94  Temp(Src) 98.1 F (36.7 C) (Oral)  Ht _0  (1.549 m)  Wt 165 lb (74.844 kg)  BMI 31.19 kg/m2  SpO2 94%  Review of Systems Chest wound is healing.      Objective:   Physical Exam VITAL SIGNS: See vs page GENERAL: no distress Chest: over the xyphoid, a healing scar is present.  Pulses: dorsalis pedis intact bilat.  MSK: no deformity of the feet. CV: no leg edema.  Skin: no ulcer on the feet. normal color and temp on the feet. Neuro: sensation is intact to touch on the feet Ext: There is bilateral onychomycosis of the toenails, and right great toenail is absent.     Lab Results  Component Value Date   HGBA1C 10.5 12/13/2014      Assessment & Plan:  DM: glycemic control is worse: therapy limited by noncompliance with cbg recording and insulin dosing.   Depression: improved, but she should continue mediation for this, at least for now.    Patient is advised the following: Patient Instructions  please take the 2 different injections (but both before breakfast), 40 units of N, and 70 units of R.   It is important to take these numbers, no matter what your blood sugar is.   Please come back for a "medicare wellness" appointment in 2-3 months.   check your blood sugar twice a day.  vary the time of day when you check, between before the 3  meals, and at bedtime.  also check if you have symptoms of your blood sugar being too high or too low.  please keep a record of the readings and bring it to your next appointment here.  You can write it on any piece of paper.  please call us sooner if your blood sugar goes below 70,  or if you have a lot of readings over 200.   Please continue the same pill for depression for now.

## 2014-12-13 NOTE — Patient Instructions (Addendum)
please take the 2 different injections (but both before breakfast), 40 units of N, and 70 units of R.   It is important to take these numbers, no matter what your blood sugar is.   Please come back for a "medicare wellness" appointment in 2-3 months.   check your blood sugar twice a day.  vary the time of day when you check, between before the 3 meals, and at bedtime.  also check if you have symptoms of your blood sugar being too high or too low.  please keep a record of the readings and bring it to your next appointment here.  You can write it on any piece of paper.  please call us sooner if your blood sugar goes below 70, or if you have a lot of readings over 200.   Please continue the same pill for depression for now.

## 2014-12-18 ENCOUNTER — Telehealth: Payer: Self-pay | Admitting: Endocrinology

## 2014-12-18 NOTE — Telephone Encounter (Signed)
Please verify pt takes 2 different insulins (both before breakfast), 40 units of N, and 70 units of R. If so, please reduce NPH to 30 units qam

## 2014-12-18 NOTE — Telephone Encounter (Signed)
See note below and please advise, Thanks! 

## 2014-12-18 NOTE — Telephone Encounter (Signed)
Patient stated b/s dropped 45, 46 this morning around 4:30 am, but is feeling fine right now,

## 2014-12-18 NOTE — Telephone Encounter (Signed)
I contacted the pt and left a voicemail advising of the new insulin instructions.

## 2015-01-01 ENCOUNTER — Other Ambulatory Visit: Payer: Self-pay | Admitting: Endocrinology

## 2015-02-13 ENCOUNTER — Other Ambulatory Visit: Payer: Self-pay | Admitting: Endocrinology

## 2015-02-14 ENCOUNTER — Telehealth: Payer: Self-pay | Admitting: Endocrinology

## 2015-02-14 MED ORDER — INSULIN REGULAR HUMAN 100 UNIT/ML IJ SOLN: 25.0000 [IU] | Freq: Every day | INTRAMUSCULAR | Status: DC

## 2015-02-14 MED ORDER — INSULIN NPH (HUMAN) (ISOPHANE) 100 UNIT/ML ~~LOC~~ SUSP: 30.0000 [IU] | Freq: Every day | SUBCUTANEOUS | Status: DC

## 2015-02-14 NOTE — Telephone Encounter (Signed)
Pt notified rx has been changed to Novolin N and R. Pt verbalized understanding.

## 2015-02-14 NOTE — Telephone Encounter (Signed)
Patient stated that insurance Co. will not cover her medication Humulin N and R, it need a prior Auth, please advise

## 2015-03-05 ENCOUNTER — Encounter: Payer: Self-pay | Admitting: Endocrinology

## 2015-03-05 ENCOUNTER — Ambulatory Visit (INDEPENDENT_AMBULATORY_CARE_PROVIDER_SITE_OTHER): Payer: Commercial Managed Care - HMO | Admitting: Endocrinology

## 2015-03-05 VITALS — BP 132/62 | HR 92 | Temp 98.8°F | Ht 61.0 in | Wt 176.0 lb

## 2015-03-05 DIAGNOSIS — L97509 Non-pressure chronic ulcer of other part of unspecified foot with unspecified severity: Secondary | ICD-10-CM | POA: Insufficient documentation

## 2015-03-05 DIAGNOSIS — E1021 Type 1 diabetes mellitus with diabetic nephropathy: Secondary | ICD-10-CM | POA: Diagnosis not present

## 2015-03-05 DIAGNOSIS — L97519 Non-pressure chronic ulcer of other part of right foot with unspecified severity: Secondary | ICD-10-CM

## 2015-03-05 LAB — POCT GLYCOSYLATED HEMOGLOBIN (HGB A1C): Hemoglobin A1C: 8.8

## 2015-03-05 MED ORDER — INSULIN NPH (HUMAN) (ISOPHANE) 100 UNIT/ML ~~LOC~~ SUSP: 30.0000 [IU] | SUBCUTANEOUS | Status: DC

## 2015-03-05 MED ORDER — INSULIN REGULAR HUMAN 100 UNIT/ML IJ SOLN: 70.0000 [IU] | Freq: Every day | INTRAMUSCULAR | Status: DC

## 2015-03-05 NOTE — Progress Notes (Signed)
Subjective:    Patient ID: Yvonne Reynolds, female    DOB: 07-08-1945, 70 y.o.   MRN: [id number]  HPI Pt returns for f/u of diabetes mellitus: DM type: Insulin-requiring type 2 Dx'ed: 3710 Complications: retinopathy and nephropathy.  Therapy: insulin since 1999 GDM: never DKA: never Severe hypoglycemia: never Pancreatitis: never.  Other: she chose a simple and inexpensive insulin schedule.   Interval history: Pt says she takes regular, 70 units qam, and NPH, 30 units qam also.  no cbg record, but states cbg's are well-controlled.  She has mild hypoglycemia in the early hrs of the am. Pt says she never misses the insulin.   Pt has 6 weeks of ulcer at the right little toe, and assoc pain.   Past Medical History  Diagnosis Date  . History of seizure     x 1 - due to hyperglycemia  . Macular degeneration   . High cholesterol   . Osteoporosis   . Nocturia   . Insulin dependent diabetes mellitus ([place])   . Sebaceous cyst 09/2014    chest wall - sternum  . Hypertension     states under control with med., has been on med. > 20 yr.  . Depression   . GERD (gastroesophageal reflux disease)   . Osteoarthritis     shoulder  . Popping of temporomandibular joint on opening of jaw   . Complication of anesthesia     states was hard to wake up after 1st EGD  . Toenail fungus     Past Surgical History  Procedure Laterality Date  . Abdominal hysterectomy      partial  . Esophagogastroduodenoscopy  05/07/2004  . Cesarean section    . Orif wrist fracture Left   . Orif ankle fracture Left 07/23/2012    Procedure: OPEN REDUCTION INTERNAL FIXATION (ORIF) ANKLE FRACTURE;  Surgeon: Jessy Oto, MD;  Location: [place];  Service: Orthopedics;  Laterality: Left;  . Esophagogastroduodenoscopy (egd) with propofol  07/06/2013  . Cataract extraction w/ intraocular lens  implant, bilateral Bilateral   . Bladder suspension    . Mass excision N/A 10/17/2014    Procedure: EXCISION 2X3CM SUBCUTANEOUS  CHEST WALL CYST;  Surgeon: Rolm Bookbinder, MD;  Location: [place];  Service: General;  Laterality: N/A;    Social History   Social History  . Marital Status: Widowed    Spouse Name: N/A  . Number of Children: 3  . Years of Education: N/A   Occupational History  . 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 Smoking status: Former Smoker -- 0.00 packs/day    Quit date: 11/11/2011  . Smokeless tobacco: Never Used  . Alcohol Use: No  . Drug Use: No  . Sexual Activity: Not on file   Other Topics Concern  . Not on file   Social History Narrative   Widowed   Daily Caffeine Use-2 cups daily or more   Pt does not get regular exercise   Works [employer], 3rd shift    Current Outpatient Prescriptions on File Prior to Visit  Medication Sig Dispense Refill  . Alcohol Swabs (B-D SINGLE USE SWABS REGULAR) PADS     . Blood Glucose Calibration (TRUE METRIX LEVEL 3) HIGH SOLN     . Blood Glucose Monitoring Suppl (TRUE METRIX AIR GLUCOSE METER) W/DEVICE KIT 1 Device by Other route once. 1 kit 0  . calcium carbonate (OS-CAL) 600 MG TABS  Take 2,600 mg by mouth daily.     Marland Kitchen glucose blood (ACCU-CHEK SMARTVIEW) test strip Use to check blood sugar 2 times per day. And Lancets 2/day Dx Code: E11.9 200 each 1  . lisinopril (PRINIVIL,ZESTRIL) 10 MG tablet TAKE 1 TABLET EVERY DAY 90 tablet 3  . MONOJECT ULTRA COMFORT SYRINGE 29G X 1/2" 0.5 ML MISC USE AS DIRECTED 300 each 3  . omeprazole (PRILOSEC) 40 MG capsule Take 1 capsule (40 mg total) by mouth 2 (two) times daily. 180 capsule 3  . ranitidine (ZANTAC) 300 MG tablet Take 1 tablet (300 mg total) by mouth at bedtime. 90 tablet 3  . simvastatin (ZOCOR) 80 MG tablet Take 0.5 tablets (40 mg total) by mouth at bedtime. 45 tablet 3  . TRUEPLUS LANCETS 28G MISC     . venlafaxine XR (EFFEXOR-XR) 75 MG 24 hr capsule TAKE 1 CAPSULE (75 MG TOTAL) BY MOUTH DAILY WITH BREAKFAST. 90 capsule 1    . vitamin D, CHOLECALCIFEROL, 400 UNITS tablet Take 400 Units by mouth daily.    . vitamin E (VITAMIN E) 400 UNIT capsule Take 400 Units by mouth daily.     No current facility-administered medications on file prior to visit.    Allergies  Allergen Reactions  . Adhesive [Tape] Rash    SKIN IRRITATION    Family History  Problem Relation Age of Onset  . Ovarian cancer Mother   . Stroke Father   . Diabetes Sister   . Stroke Sister   . Diabetes Brother   . Diabetes Sister     BP 132/62 mmHg  Pulse 92  Temp(Src) 98.8 F (37.1 C) (Oral)  Ht 5' 1"  (1.549 m)  Wt 176 lb (79.833 kg)  BMI 33.27 kg/m2  SpO2 97%  Review of Systems She denies fever and LOC.      Objective:   Physical Exam VITAL SIGNS:  See vs page GENERAL: no distress Pulses: dorsalis pedis intact bilat.   MSK: no deformity of the feet CV: no leg edema Skin:  1 cm shallow ulcer on the tip of the right little toe.  normal color and temp on the feet. Neuro: sensation is intact to touch on the feet     A1c=8.5%    Assessment & Plan:  DM: she needs increased rx Noncompliance with cbg recording: the next step is to see cbg record. Toe ulcer, new  Patient is advised the following: Patient Instructions  please take the 2 different injections (but both before breakfast), 30 units of N, and 70 units of R.   It is important to take these numbers, no matter what your blood sugar is.   Please come back for a "medicare wellness" appointment in 2 months.   check your blood sugar twice a day.  vary the time of day when you check, between before the 3 meals, and at bedtime.  also check if you have symptoms of your blood sugar being too high or too low.  please keep a record of the readings and bring it to your next appointment here.  You can write it on any piece of paper.  please call us sooner if your blood sugar goes below 70, or if you have a lot of readings over 200.   Please see a wound specialist.  you will  receive a phone call, about a day and time for an appointment.

## 2015-03-05 NOTE — Patient Instructions (Addendum)
please take the 2 different injections (but both before breakfast), 30 units of N, and 70 units of R.   It is important to take these numbers, no matter what your blood sugar is.   Please come back for a "medicare wellness" appointment in 2 months.   check your blood sugar twice a day.  vary the time of day when you check, between before the 3 meals, and at bedtime.  also check if you have symptoms of your blood sugar being too high or too low.  please keep a record of the readings and bring it to your next appointment here.  You can write it on any piece of paper.  please call us sooner if your blood sugar goes below 70, or if you have a lot of readings over 200.   Please see a wound specialist.  you will receive a phone call, about a day and time for an appointment.

## 2015-03-09 ENCOUNTER — Telehealth: Payer: Self-pay | Admitting: Endocrinology

## 2015-03-09 MED ORDER — HYDROCODONE-ACETAMINOPHEN 5-325 MG PO TABS: 1.0000 | ORAL_TABLET | Freq: Four times a day (QID) | ORAL | Status: DC | PRN

## 2015-03-09 NOTE — Telephone Encounter (Signed)
See note below and please advise. [facility] is currently working on the referral to wound care.

## 2015-03-09 NOTE — Telephone Encounter (Signed)
Patient stated that none has called her to set up an appointment, she is in so much pain, can you call her something in for pain please, please advise

## 2015-03-09 NOTE — Telephone Encounter (Signed)
Pt advised the rx for the hydrocodone is ready for pick up. Rx placed up front for the pt to pick up.

## 2015-03-09 NOTE — Telephone Encounter (Signed)
i printed 

## 2015-03-13 ENCOUNTER — Telehealth: Payer: Self-pay | Admitting: Endocrinology

## 2015-03-13 NOTE — Telephone Encounter (Signed)
[facility] has been notified to schedule the appointment. They will contact the pt for her appointment.

## 2015-03-13 NOTE — Telephone Encounter (Signed)
Patient stated that wound center haven't called her yet to set up an appointment. Please advise

## 2015-03-14 ENCOUNTER — Telehealth: Payer: Self-pay | Admitting: Endocrinology

## 2015-03-14 NOTE — Telephone Encounter (Signed)
Pt's granddaughter was advised appointment has been scheduled and the pt has been contacted.

## 2015-03-14 NOTE — Telephone Encounter (Signed)
Patient ask for the address for thewound clinic so she can call her, please advise herself [phone]

## 2015-03-15 ENCOUNTER — Ambulatory Visit: Payer: Commercial Managed Care - HMO | Admitting: Endocrinology

## 2015-03-19 ENCOUNTER — Telehealth: Payer: Self-pay | Admitting: Endocrinology

## 2015-03-19 MED ORDER — INSULIN NPH (HUMAN) (ISOPHANE) 100 UNIT/ML ~~LOC~~ SUSP: 30.0000 [IU] | SUBCUTANEOUS | Status: DC

## 2015-03-19 MED ORDER — INSULIN REGULAR HUMAN 100 UNIT/ML IJ SOLN: 70.0000 [IU] | Freq: Every day | INTRAMUSCULAR | Status: DC

## 2015-03-19 NOTE — Telephone Encounter (Signed)
Pt called and said that her insulin dosage that was sent to her was messed up and wanted to talk to Dr. Cordelia Pen nurse.

## 2015-03-19 NOTE — Telephone Encounter (Signed)
I contacted the Yvonne Reynolds and advised per dr. Loanne Drilling she is take 30 units of Novolin N and 70 units of Novolin R. Yvonne Reynolds voiced understanding and requested the medication to be sent to her local [place]. Rx's sent per Yvonne Reynolds's request.

## 2015-03-21 ENCOUNTER — Other Ambulatory Visit: Payer: Self-pay | Admitting: Surgery

## 2015-03-21 ENCOUNTER — Encounter (HOSPITAL_BASED_OUTPATIENT_CLINIC_OR_DEPARTMENT_OTHER): Payer: Commercial Managed Care - HMO | Attending: Surgery

## 2015-03-21 ENCOUNTER — Ambulatory Visit (HOSPITAL_COMMUNITY)
Admission: RE | Admit: 2015-03-21 | Discharge: 2015-03-21 | Disposition: A | Discharge status: Home / Self Care | Payer: Commercial Managed Care - HMO | Source: Ambulatory Visit | Attending: Surgery | Admitting: Surgery

## 2015-03-21 DIAGNOSIS — L97511 Non-pressure chronic ulcer of other part of right foot limited to breakdown of skin: Secondary | ICD-10-CM | POA: Diagnosis not present

## 2015-03-21 DIAGNOSIS — Z79899 Other long term (current) drug therapy: Secondary | ICD-10-CM | POA: Diagnosis not present

## 2015-03-21 DIAGNOSIS — L97512 Non-pressure chronic ulcer of other part of right foot with fat layer exposed: Secondary | ICD-10-CM | POA: Insufficient documentation

## 2015-03-21 DIAGNOSIS — E11621 Type 2 diabetes mellitus with foot ulcer: Secondary | ICD-10-CM | POA: Insufficient documentation

## 2015-03-21 DIAGNOSIS — S91104A Unspecified open wound of right lesser toe(s) without damage to nail, initial encounter: Secondary | ICD-10-CM | POA: Diagnosis not present

## 2015-03-21 DIAGNOSIS — H409 Unspecified glaucoma: Secondary | ICD-10-CM | POA: Insufficient documentation

## 2015-03-21 DIAGNOSIS — I10 Essential (primary) hypertension: Secondary | ICD-10-CM | POA: Diagnosis not present

## 2015-03-21 DIAGNOSIS — E785 Hyperlipidemia, unspecified: Secondary | ICD-10-CM | POA: Insufficient documentation

## 2015-03-21 DIAGNOSIS — Z79891 Long term (current) use of opiate analgesic: Secondary | ICD-10-CM | POA: Diagnosis not present

## 2015-03-21 DIAGNOSIS — E1152 Type 2 diabetes mellitus with diabetic peripheral angiopathy with gangrene: Secondary | ICD-10-CM | POA: Insufficient documentation

## 2015-03-21 DIAGNOSIS — E1121 Type 2 diabetes mellitus with diabetic nephropathy: Secondary | ICD-10-CM | POA: Insufficient documentation

## 2015-03-21 DIAGNOSIS — M79674 Pain in right toe(s): Secondary | ICD-10-CM

## 2015-03-21 DIAGNOSIS — H3530 Unspecified macular degeneration: Secondary | ICD-10-CM | POA: Diagnosis not present

## 2015-03-21 DIAGNOSIS — E1136 Type 2 diabetes mellitus with diabetic cataract: Secondary | ICD-10-CM | POA: Insufficient documentation

## 2015-03-21 DIAGNOSIS — E7800 Pure hypercholesterolemia, unspecified: Secondary | ICD-10-CM | POA: Insufficient documentation

## 2015-03-21 DIAGNOSIS — I70235 Atherosclerosis of native arteries of right leg with ulceration of other part of foot: Secondary | ICD-10-CM | POA: Insufficient documentation

## 2015-03-21 DIAGNOSIS — M19011 Primary osteoarthritis, right shoulder: Secondary | ICD-10-CM | POA: Diagnosis not present

## 2015-03-21 DIAGNOSIS — Z8541 Personal history of malignant neoplasm of cervix uteri: Secondary | ICD-10-CM | POA: Insufficient documentation

## 2015-03-21 DIAGNOSIS — Z794 Long term (current) use of insulin: Secondary | ICD-10-CM | POA: Diagnosis not present

## 2015-03-21 DIAGNOSIS — Z87891 Personal history of nicotine dependence: Secondary | ICD-10-CM | POA: Insufficient documentation

## 2015-03-21 DIAGNOSIS — M19012 Primary osteoarthritis, left shoulder: Secondary | ICD-10-CM | POA: Insufficient documentation

## 2015-03-21 DIAGNOSIS — S91301A Unspecified open wound, right foot, initial encounter: Secondary | ICD-10-CM | POA: Diagnosis not present

## 2015-03-27 ENCOUNTER — Ambulatory Visit (HOSPITAL_COMMUNITY)
Admission: RE | Admit: 2015-03-27 | Discharge: 2015-03-27 | Disposition: A | Discharge status: Home / Self Care | Payer: Commercial Managed Care - HMO | Source: Ambulatory Visit | Attending: Vascular Surgery | Admitting: Vascular Surgery

## 2015-03-27 ENCOUNTER — Other Ambulatory Visit: Payer: Self-pay | Admitting: Surgery

## 2015-03-27 DIAGNOSIS — E7800 Pure hypercholesterolemia, unspecified: Secondary | ICD-10-CM | POA: Insufficient documentation

## 2015-03-27 DIAGNOSIS — L98499 Non-pressure chronic ulcer of skin of other sites with unspecified severity: Secondary | ICD-10-CM | POA: Insufficient documentation

## 2015-03-27 DIAGNOSIS — E119 Type 2 diabetes mellitus without complications: Secondary | ICD-10-CM | POA: Diagnosis not present

## 2015-03-27 DIAGNOSIS — I10 Essential (primary) hypertension: Secondary | ICD-10-CM | POA: Insufficient documentation

## 2015-03-28 ENCOUNTER — Other Ambulatory Visit (HOSPITAL_COMMUNITY): Payer: Self-pay | Admitting: Surgery

## 2015-03-28 ENCOUNTER — Encounter (HOSPITAL_BASED_OUTPATIENT_CLINIC_OR_DEPARTMENT_OTHER): Payer: Commercial Managed Care - HMO | Attending: Surgery

## 2015-03-28 DIAGNOSIS — Z87891 Personal history of nicotine dependence: Secondary | ICD-10-CM | POA: Insufficient documentation

## 2015-03-28 DIAGNOSIS — I70235 Atherosclerosis of native arteries of right leg with ulceration of other part of foot: Secondary | ICD-10-CM | POA: Diagnosis not present

## 2015-03-28 DIAGNOSIS — F40240 Claustrophobia: Secondary | ICD-10-CM | POA: Diagnosis not present

## 2015-03-28 DIAGNOSIS — E1152 Type 2 diabetes mellitus with diabetic peripheral angiopathy with gangrene: Secondary | ICD-10-CM | POA: Diagnosis not present

## 2015-03-28 DIAGNOSIS — M1990 Unspecified osteoarthritis, unspecified site: Secondary | ICD-10-CM | POA: Insufficient documentation

## 2015-03-28 DIAGNOSIS — F419 Anxiety disorder, unspecified: Secondary | ICD-10-CM | POA: Diagnosis not present

## 2015-03-28 DIAGNOSIS — Z79899 Other long term (current) drug therapy: Secondary | ICD-10-CM

## 2015-03-28 DIAGNOSIS — L97511 Non-pressure chronic ulcer of other part of right foot limited to breakdown of skin: Secondary | ICD-10-CM | POA: Diagnosis not present

## 2015-03-28 DIAGNOSIS — I10 Essential (primary) hypertension: Secondary | ICD-10-CM | POA: Insufficient documentation

## 2015-03-28 DIAGNOSIS — E11621 Type 2 diabetes mellitus with foot ulcer: Secondary | ICD-10-CM | POA: Diagnosis not present

## 2015-03-29 ENCOUNTER — Other Ambulatory Visit: Payer: Self-pay | Admitting: Surgery

## 2015-03-29 ENCOUNTER — Ambulatory Visit (HOSPITAL_COMMUNITY)
Admission: RE | Admit: 2015-03-29 | Discharge: 2015-03-29 | Disposition: A | Discharge status: Home / Self Care | Payer: Commercial Managed Care - HMO | Source: Ambulatory Visit | Attending: Surgery | Admitting: Surgery

## 2015-03-29 ENCOUNTER — Ambulatory Visit (HOSPITAL_COMMUNITY): Payer: Commercial Managed Care - HMO

## 2015-03-29 DIAGNOSIS — Z0000 Encounter for general adult medical examination without abnormal findings: Secondary | ICD-10-CM

## 2015-03-29 DIAGNOSIS — Z01810 Encounter for preprocedural cardiovascular examination: Secondary | ICD-10-CM | POA: Insufficient documentation

## 2015-03-29 DIAGNOSIS — R0602 Shortness of breath: Secondary | ICD-10-CM | POA: Diagnosis not present

## 2015-03-30 ENCOUNTER — Other Ambulatory Visit: Payer: Self-pay | Admitting: Endocrinology

## 2015-03-30 DIAGNOSIS — L97519 Non-pressure chronic ulcer of other part of right foot with unspecified severity: Secondary | ICD-10-CM

## 2015-04-06 ENCOUNTER — Encounter: Payer: Self-pay | Admitting: Vascular Surgery

## 2015-04-10 ENCOUNTER — Encounter: Payer: Self-pay | Admitting: Vascular Surgery

## 2015-04-10 ENCOUNTER — Other Ambulatory Visit: Payer: Self-pay

## 2015-04-10 ENCOUNTER — Telehealth: Payer: Self-pay | Admitting: Endocrinology

## 2015-04-10 ENCOUNTER — Ambulatory Visit (INDEPENDENT_AMBULATORY_CARE_PROVIDER_SITE_OTHER): Payer: Commercial Managed Care - HMO | Admitting: Vascular Surgery

## 2015-04-10 VITALS — BP 136/53 | HR 104 | Ht 61.0 in | Wt 177.4 lb

## 2015-04-10 DIAGNOSIS — I739 Peripheral vascular disease, unspecified: Secondary | ICD-10-CM | POA: Diagnosis not present

## 2015-04-10 MED ORDER — HYDROCODONE-ACETAMINOPHEN 10-325 MG PO TABS: 1.0000 | ORAL_TABLET | Freq: Three times a day (TID) | ORAL | Status: DC | PRN

## 2015-04-10 NOTE — Telephone Encounter (Signed)
Please see message and advise 

## 2015-04-10 NOTE — Progress Notes (Signed)
Subjective:     Patient ID: Yvonne Reynolds, female   DOB: 1946/01/02, 70 y.o.   MRN: [id number]  HPI this 70 year old female was referred I Dr. Con Memos for gangrene of the right fifth toe. Patient states that she stepped on a broken Christmas ornament over the Christmas holidays and some glass entered her right fifth toe and the plantar aspect of her foot. She had no problems with her fifth toe at that time. She did have a history of calf claudication after 1-2 blocks. She had no history of rest pain and nonhealing ulcers or infection. Over the past 8 weeks she has been seen in the wound center and has also had progressive darkening of the tip of the right fifth toe. She does have type 1 diabetes mellitus and has a long history of tobacco abuse although she quit smoking 4 years ago. She smoked for 40+ years one half to one pack per day. She has no symptoms in the contralateral left leg. She had a recent x-ray which revealed no osteomyelitis and no evidence of foreign body in the right fifth toe. She did state that she remove particles of glass from te end of the fifth toe after this incident.  Past Medical History  Diagnosis Date  . History of seizure     x 1 - due to hyperglycemia  . Macular degeneration   . High cholesterol   . Osteoporosis   . Nocturia   . Insulin dependent diabetes mellitus ([place])   . Sebaceous cyst 09/2014    chest wall - sternum  . Hypertension     states under control with med., has been on med. > 20 yr.  . Depression   . GERD (gastroesophageal reflux disease)   . Osteoarthritis     shoulder  . Popping of temporomandibular joint on opening of jaw   . Complication of anesthesia     states was hard to wake up after 1st EGD  . Toenail fungus     Social History  Substance Use Topics  . Smoking status: Former Smoker -- 0.00 packs/day    Quit date: 11/11/2011  . Smokeless tobacco: Never Used  . Alcohol Use: No    Family History  Problem Relation Age of Onset  .  Ovarian cancer Mother   . Stroke Father   . Diabetes Sister   . Stroke Sister   . Heart disease Sister     before age 70  . Diabetes Brother   . Heart disease Brother     before age 12  . Diabetes Sister     Allergies  Allergen Reactions  . Procaine Other (See Comments)  . Adhesive [Tape] Rash    SKIN IRRITATION     Current outpatient prescriptions:  .  Alcohol Swabs (B-D SINGLE USE SWABS REGULAR) PADS, , Disp: , Rfl:  .  Blood Glucose Calibration (TRUE METRIX LEVEL 3) HIGH SOLN, , Disp: , Rfl:  .  Blood Glucose Monitoring Suppl (TRUE METRIX AIR GLUCOSE METER) W/DEVICE KIT, 1 Device by Other route once., Disp: 1 kit, Rfl: 0 .  calcium carbonate (OS-CAL) 600 MG TABS, Take 2,600 mg by mouth daily. , Disp: , Rfl:  .  glucose blood (ACCU-CHEK SMARTVIEW) test strip, Use to check blood sugar 2 times per day. And Lancets 2/day Dx Code: E11.9, Disp: 200 each, Rfl: 1 .  HYDROcodone-acetaminophen (NORCO/VICODIN) 5-325 MG tablet, Take 1 tablet by mouth every 6 (six) hours as needed for moderate pain., Disp: 30  tablet, Rfl: 0 .  insulin NPH Human (NOVOLIN N) 100 UNIT/ML injection, Inject 0.3 mLs (30 Units total) into the skin every morning., Disp: 10 mL, Rfl: 11 .  insulin regular (NOVOLIN R) 100 units/mL injection, Inject 0.7 mLs (70 Units total) into the skin daily with breakfast., Disp: 30 mL, Rfl: 11 .  lisinopril (PRINIVIL,ZESTRIL) 10 MG tablet, TAKE 1 TABLET EVERY DAY, Disp: 90 tablet, Rfl: 3 .  MONOJECT ULTRA COMFORT SYRINGE 29G X 1/2" 0.5 ML MISC, USE AS DIRECTED, Disp: 300 each, Rfl: 3 .  omeprazole (PRILOSEC) 40 MG capsule, Take 1 capsule (40 mg total) by mouth 2 (two) times daily., Disp: 180 capsule, Rfl: 3 .  ranitidine (ZANTAC) 300 MG tablet, Take 1 tablet (300 mg total) by mouth at bedtime., Disp: 90 tablet, Rfl: 3 .  simvastatin (ZOCOR) 80 MG tablet, Take 0.5 tablets (40 mg total) by mouth at bedtime., Disp: 45 tablet, Rfl: 3 .  TRUEPLUS LANCETS 28G MISC, , Disp: , Rfl:  .   venlafaxine XR (EFFEXOR-XR) 75 MG 24 hr capsule, TAKE 1 CAPSULE (75 MG TOTAL) BY MOUTH DAILY WITH BREAKFAST., Disp: 90 capsule, Rfl: 1 .  vitamin D, CHOLECALCIFEROL, 400 UNITS tablet, Take 400 Units by mouth daily., Disp: , Rfl:  .  vitamin E (VITAMIN E) 400 UNIT capsule, Take 400 Units by mouth daily., Disp: , Rfl:   Filed Vitals:   04/10/15 1424 04/10/15 1431  BP: 149/60 136/53  Pulse: 104   Height: 5' 1"  (1.549 m)   Weight: 177 lb 6.4 oz (80.468 kg)   SpO2: 97%     Body mass index is 33.54 kg/(m^2).           Review of Systems Denies chest pain but does have chronic dyspnea on exertion. Walking is limited not only by dyspnea but also by leg discomfort. Has history of ypertension, hyperlipidemia, gastroesophageal reflux disease. No previous myocardial infarction reported. Other systems negative an a complete reiew of systems other than type 1 diabetes mellitus and tobacco abuse     Objective:   Physical Exam BP 136/53 mmHg  Pulse 104  Ht 5' 1"  (1.549 m)  Wt 177 lb 6.4 oz (80.468 kg)  BMI 33.54 kg/m2  SpO2 97%  Gen.-alert and oriented x3 in no apparent distress HEENT normal for age - obese Lungs no rhonchi or wheezing Cardiovascular regular rhythm no murmurs carotid pulses 3+ palpable no bruits audible Abdomen soft nontender no palpable masses Musculoskeletal free of  major deformities Skin clear -no rashes Neurologic normal Lower extremities 3+ femoral and  2+ dorsalis pedis pulse palpable in the right leg. Dry gangrene tip of right fifth toe with some mild erythema proximally. No purulence or fluctuance noted.  left leg free of any ischemic ulcers. 3+ femoral and posterior tibial pulse palpable left foot.  Today I ordered lower extremity arterial study which I reviewed and interpreted. There is triphasic flow in the left foot and biphasic flow in the right foot with fairly good waveforms. ABI on the right posterior tibial is 0.60 in the left is 0.90.        Assessment:      traumatic injury right fifth toe with broken glass but no evidence of retained foreign body on plain x-ray and no evidence of osteomyelitis -resulting in gangrene tip right fifth toe  PAD with suspected mild to moderate right superficial femoral and tibial occlusive disease -ABI equals 0.60 but palpable dorsalis pedis pulse Type 1 diabetes mellitus Hypertension Hyperlipidemia  GERD  Plan:      we'll schedule patient for aortobifemoral angiogram with right leg runoff to see if PTA and stenting of superficial femoral or popliteal lesion might be feasible or indicated. This will occur on Thursday March 16 by Dr. Bridgett Larsson    if no evidence of potential for endovascular intervention and patient has open in-line flow to foot will then probably need to perform right fifth toe amputation in the near future possibly next week Await results of angiograpy

## 2015-04-10 NOTE — Telephone Encounter (Signed)
Spoke to pt. Told her Dr. Loanne Drilling increased her pain medication and I have a Rx ready for pickup, will be at the front desk. Pt verbalized understanding. Rx printed and signed by Dr. Loanne Drilling.

## 2015-04-10 NOTE — Telephone Encounter (Signed)
PT daughter called and said she is still having bad pains that are keeping her up at night and she is out of her pain medication.  She also wants to know if he can give her a stronger dose.

## 2015-04-10 NOTE — Telephone Encounter (Signed)
i printed 

## 2015-04-10 NOTE — Telephone Encounter (Signed)
Pt is calling again for a rx for her pain meds

## 2015-04-11 ENCOUNTER — Telehealth: Payer: Self-pay | Admitting: *Deleted

## 2015-04-11 NOTE — Telephone Encounter (Signed)
LMTCB   Patient had called to request another time for her aortogram tomorrow. Unfortunately we can't switch her back to the old time.  She is currently a 5:30am arrival for 7:30am aortogram in the [facility] lab with Dr. Bridgett Larsson

## 2015-04-12 ENCOUNTER — Ambulatory Visit (HOSPITAL_COMMUNITY)
Admission: RE | Admit: 2015-04-12 | Discharge: 2015-04-12 | Disposition: A | Discharge status: Home / Self Care | Payer: Commercial Managed Care - HMO | Source: Ambulatory Visit | Attending: Vascular Surgery | Admitting: Vascular Surgery

## 2015-04-12 ENCOUNTER — Encounter (HOSPITAL_COMMUNITY)
Admission: RE | Disposition: A | Discharge status: Home / Self Care | Payer: Self-pay | Source: Ambulatory Visit | Attending: Vascular Surgery

## 2015-04-12 ENCOUNTER — Other Ambulatory Visit: Payer: Self-pay | Admitting: *Deleted

## 2015-04-12 DIAGNOSIS — Z87891 Personal history of nicotine dependence: Secondary | ICD-10-CM | POA: Diagnosis not present

## 2015-04-12 DIAGNOSIS — I10 Essential (primary) hypertension: Secondary | ICD-10-CM | POA: Diagnosis not present

## 2015-04-12 DIAGNOSIS — F329 Major depressive disorder, single episode, unspecified: Secondary | ICD-10-CM | POA: Insufficient documentation

## 2015-04-12 DIAGNOSIS — E1052 Type 1 diabetes mellitus with diabetic peripheral angiopathy with gangrene: Secondary | ICD-10-CM | POA: Diagnosis not present

## 2015-04-12 DIAGNOSIS — I70261 Atherosclerosis of native arteries of extremities with gangrene, right leg: Secondary | ICD-10-CM | POA: Diagnosis not present

## 2015-04-12 DIAGNOSIS — E7800 Pure hypercholesterolemia, unspecified: Secondary | ICD-10-CM | POA: Diagnosis not present

## 2015-04-12 DIAGNOSIS — Z8249 Family history of ischemic heart disease and other diseases of the circulatory system: Secondary | ICD-10-CM | POA: Insufficient documentation

## 2015-04-12 DIAGNOSIS — I739 Peripheral vascular disease, unspecified: Secondary | ICD-10-CM

## 2015-04-12 DIAGNOSIS — K219 Gastro-esophageal reflux disease without esophagitis: Secondary | ICD-10-CM | POA: Diagnosis not present

## 2015-04-12 HISTORY — PX: LOWER EXTREMITY ANGIOGRAM: SHX5508

## 2015-04-12 HISTORY — PX: PERIPHERAL VASCULAR CATHETERIZATION: SHX172C

## 2015-04-12 LAB — POCT I-STAT, CHEM 8
BUN: 22 mg/dL — ABNORMAL HIGH (ref 6–20)
CHLORIDE: 105 mmol/L (ref 101–111)
CREATININE: 0.9 mg/dL (ref 0.44–1.00)
Calcium, Ion: 1.18 mmol/L (ref 1.13–1.30)
GLUCOSE: 104 mg/dL — AB (ref 65–99)
HCT: 39 % (ref 36.0–46.0)
Hemoglobin: 13.3 g/dL (ref 12.0–15.0)
POTASSIUM: 4.1 mmol/L (ref 3.5–5.1)
Sodium: 142 mmol/L (ref 135–145)
TCO2: 22 mmol/L (ref 0–100)

## 2015-04-12 LAB — POCT ACTIVATED CLOTTING TIME
ACTIVATED CLOTTING TIME: 209 s
Activated Clotting Time: 178 seconds
Activated Clotting Time: 193 seconds

## 2015-04-12 LAB — GLUCOSE, CAPILLARY
GLUCOSE-CAPILLARY: 99 mg/dL (ref 65–99)
Glucose-Capillary: 115 mg/dL — ABNORMAL HIGH (ref 65–99)

## 2015-04-12 SURGERY — ABDOMINAL AORTOGRAM
Laterality: Right

## 2015-04-12 MED ORDER — FENTANYL CITRATE (PF) 100 MCG/2ML IJ SOLN
INTRAMUSCULAR | Status: AC
  Filled 2015-04-12: qty 2

## 2015-04-12 MED ORDER — MIDAZOLAM HCL 2 MG/2ML IJ SOLN
INTRAMUSCULAR | Status: DC | PRN
  Administered 2015-04-12: 1 mg via INTRAVENOUS

## 2015-04-12 MED ORDER — HYDRALAZINE HCL 20 MG/ML IJ SOLN
INTRAMUSCULAR | Status: AC
  Filled 2015-04-12: qty 1

## 2015-04-12 MED ORDER — SODIUM CHLORIDE 0.9 % IV SOLN: 1.0000 mL/kg/h | INTRAVENOUS | Status: DC

## 2015-04-12 MED ORDER — MIDAZOLAM HCL 2 MG/2ML IJ SOLN
INTRAMUSCULAR | Status: AC
  Filled 2015-04-12: qty 2

## 2015-04-12 MED ORDER — FENTANYL CITRATE (PF) 100 MCG/2ML IJ SOLN
INTRAMUSCULAR | Status: DC | PRN
  Administered 2015-04-12: 25 ug via INTRAVENOUS

## 2015-04-12 MED ORDER — HYDRALAZINE HCL 20 MG/ML IJ SOLN
INTRAMUSCULAR | Status: DC | PRN
  Administered 2015-04-12: 10 mg via INTRAVENOUS

## 2015-04-12 MED ORDER — LIDOCAINE HCL (PF) 1 % IJ SOLN
INTRAMUSCULAR | Status: DC | PRN
  Administered 2015-04-12: 12 mL

## 2015-04-12 MED ORDER — LIDOCAINE HCL (PF) 1 % IJ SOLN
INTRAMUSCULAR | Status: AC
  Filled 2015-04-12: qty 30

## 2015-04-12 MED ORDER — HEPARIN SODIUM (PORCINE) 1000 UNIT/ML IJ SOLN
INTRAMUSCULAR | Status: DC | PRN
  Administered 2015-04-12: 8000 [IU] via INTRAVENOUS

## 2015-04-12 MED ORDER — SODIUM CHLORIDE 0.9 % IV SOLN
INTRAVENOUS | Status: DC
  Administered 2015-04-12: 07:00:00 via INTRAVENOUS

## 2015-04-12 MED ORDER — IODIXANOL 320 MG/ML IV SOLN
INTRAVENOUS | Status: DC | PRN
  Administered 2015-04-12: 150 mL via INTRAVENOUS

## 2015-04-12 MED ORDER — OXYCODONE-ACETAMINOPHEN 5-325 MG PO TABS: 1.0000 | ORAL_TABLET | Freq: Four times a day (QID) | ORAL | Status: DC | PRN

## 2015-04-12 MED ORDER — MORPHINE SULFATE (PF) 2 MG/ML IV SOLN: 2.0000 mg | INTRAVENOUS | Status: DC | PRN

## 2015-04-12 MED ORDER — HEPARIN (PORCINE) IN NACL 2-0.9 UNIT/ML-% IJ SOLN
INTRAMUSCULAR | Status: DC | PRN
  Administered 2015-04-12: 1000 mL

## 2015-04-12 MED ORDER — HEPARIN (PORCINE) IN NACL 2-0.9 UNIT/ML-% IJ SOLN
INTRAMUSCULAR | Status: AC
  Filled 2015-04-12: qty 1000

## 2015-04-12 SURGICAL SUPPLY — 24 items
BALLN LUTONIX DCB 5X40X130 (BALLOONS) ×5
BALLN MUSTANG 5.0X40 75 (BALLOONS) ×4
BALLN MUSTANG 5.0X40 75CM (BALLOONS) ×1
BALLOON LUTONIX DCB 5X40X130 (BALLOONS) IMPLANT
BALLOON MUSTANG 5.0X40 75 (BALLOONS) IMPLANT
CATH OMNI FLUSH 5F 65CM (CATHETERS) ×2 IMPLANT
CATH STRAIGHT 5FR 65CM (CATHETERS) ×2 IMPLANT
COVER PRB 48X5XTLSCP FOLD TPE (BAG) IMPLANT
COVER PROBE 5X48 (BAG) ×5
DEVICE CONTINUOUS FLUSH (MISCELLANEOUS) ×2 IMPLANT
GUIDEWIRE ANGLED .035X150CM (WIRE) ×2 IMPLANT
KIT ENCORE 26 ADVANTAGE (KITS) ×2 IMPLANT
KIT MICROINTRODUCER STIFF 5F (SHEATH) ×2 IMPLANT
KIT PV (KITS) ×5 IMPLANT
SHEATH PINNACLE 5F 10CM (SHEATH) ×2 IMPLANT
SHEATH PINNACLE MP 6F 45CM (SHEATH) ×2 IMPLANT
SHIELD RADPAD SCOOP 12X17 (MISCELLANEOUS) ×2 IMPLANT
SYR MEDRAD MARK V 150ML (SYRINGE) ×5 IMPLANT
TAPE RADIOPAQUE TURBO (MISCELLANEOUS) ×2 IMPLANT
TRANSDUCER W/STOPCOCK (MISCELLANEOUS) ×5 IMPLANT
TRAY PV CATH (CUSTOM PROCEDURE TRAY) ×5 IMPLANT
WIRE BENTSON .035X145CM (WIRE) ×2 IMPLANT
WIRE HI TORQ VERSACORE J 260CM (WIRE) ×2 IMPLANT
WIRE ROSEN-J .035X180CM (WIRE) ×2 IMPLANT

## 2015-04-12 NOTE — Progress Notes (Signed)
Dr. Kellie Simmering by to speak w/patient.

## 2015-04-12 NOTE — H&P (View-Only) (Signed)
Subjective:     Patient ID: Yvonne Reynolds, female   DOB: 06-11-1945, 70 y.o.   MRN: [id number]  HPI this 70 year old female was referred I Dr. Con Memos for gangrene of the right fifth toe. Patient states that she stepped on a broken Christmas ornament over the Christmas holidays and some glass entered her right fifth toe and the plantar aspect of her foot. She had no problems with her fifth toe at that time. She did have a history of calf claudication after 1-2 blocks. She had no history of rest pain and nonhealing ulcers or infection. Over the past 8 weeks she has been seen in the wound center and has also had progressive darkening of the tip of the right fifth toe. She does have type 1 diabetes mellitus and has a long history of tobacco abuse although she quit smoking 4 years ago. She smoked for 40+ years one half to one pack per day. She has no symptoms in the contralateral left leg. She had a recent x-ray which revealed no osteomyelitis and no evidence of foreign body in the right fifth toe. She did state that she remove particles of glass from te end of the fifth toe after this incident.  Past Medical History  Diagnosis Date  . History of seizure     x 1 - due to hyperglycemia  . Macular degeneration   . High cholesterol   . Osteoporosis   . Nocturia   . Insulin dependent diabetes mellitus ([place])   . Sebaceous cyst 09/2014    chest wall - sternum  . Hypertension     states under control with med., has been on med. > 20 yr.  . Depression   . GERD (gastroesophageal reflux disease)   . Osteoarthritis     shoulder  . Popping of temporomandibular joint on opening of jaw   . Complication of anesthesia     states was hard to wake up after 1st EGD  . Toenail fungus     Social History  Substance Use Topics  . Smoking status: Former Smoker -- 0.00 packs/day    Quit date: 11/11/2011  . Smokeless tobacco: Never Used  . Alcohol Use: No    Family History  Problem Relation Age of Onset  .  Ovarian cancer Mother   . Stroke Father   . Diabetes Sister   . Stroke Sister   . Heart disease Sister     before age 22  . Diabetes Brother   . Heart disease Brother     before age 34  . Diabetes Sister     Allergies  Allergen Reactions  . Procaine Other (See Comments)  . Adhesive [Tape] Rash    SKIN IRRITATION     Current outpatient prescriptions:  .  Alcohol Swabs (B-D SINGLE USE SWABS REGULAR) PADS, , Disp: , Rfl:  .  Blood Glucose Calibration (TRUE METRIX LEVEL 3) HIGH SOLN, , Disp: , Rfl:  .  Blood Glucose Monitoring Suppl (TRUE METRIX AIR GLUCOSE METER) W/DEVICE KIT, 1 Device by Other route once., Disp: 1 kit, Rfl: 0 .  calcium carbonate (OS-CAL) 600 MG TABS, Take 2,600 mg by mouth daily. , Disp: , Rfl:  .  glucose blood (ACCU-CHEK SMARTVIEW) test strip, Use to check blood sugar 2 times per day. And Lancets 2/day Dx Code: E11.9, Disp: 200 each, Rfl: 1 .  HYDROcodone-acetaminophen (NORCO/VICODIN) 5-325 MG tablet, Take 1 tablet by mouth every 6 (six) hours as needed for moderate pain., Disp: 30  tablet, Rfl: 0 .  insulin NPH Human (NOVOLIN N) 100 UNIT/ML injection, Inject 0.3 mLs (30 Units total) into the skin every morning., Disp: 10 mL, Rfl: 11 .  insulin regular (NOVOLIN R) 100 units/mL injection, Inject 0.7 mLs (70 Units total) into the skin daily with breakfast., Disp: 30 mL, Rfl: 11 .  lisinopril (PRINIVIL,ZESTRIL) 10 MG tablet, TAKE 1 TABLET EVERY DAY, Disp: 90 tablet, Rfl: 3 .  MONOJECT ULTRA COMFORT SYRINGE 29G X 1/2" 0.5 ML MISC, USE AS DIRECTED, Disp: 300 each, Rfl: 3 .  omeprazole (PRILOSEC) 40 MG capsule, Take 1 capsule (40 mg total) by mouth 2 (two) times daily., Disp: 180 capsule, Rfl: 3 .  ranitidine (ZANTAC) 300 MG tablet, Take 1 tablet (300 mg total) by mouth at bedtime., Disp: 90 tablet, Rfl: 3 .  simvastatin (ZOCOR) 80 MG tablet, Take 0.5 tablets (40 mg total) by mouth at bedtime., Disp: 45 tablet, Rfl: 3 .  TRUEPLUS LANCETS 28G MISC, , Disp: , Rfl:  .   venlafaxine XR (EFFEXOR-XR) 75 MG 24 hr capsule, TAKE 1 CAPSULE (75 MG TOTAL) BY MOUTH DAILY WITH BREAKFAST., Disp: 90 capsule, Rfl: 1 .  vitamin D, CHOLECALCIFEROL, 400 UNITS tablet, Take 400 Units by mouth daily., Disp: , Rfl:  .  vitamin E (VITAMIN E) 400 UNIT capsule, Take 400 Units by mouth daily., Disp: , Rfl:   Filed Vitals:   04/10/15 1424 04/10/15 1431  BP: 149/60 136/53  Pulse: 104   Height: 5' 1"  (1.549 m)   Weight: 177 lb 6.4 oz (80.468 kg)   SpO2: 97%     Body mass index is 33.54 kg/(m^2).           Review of Systems Denies chest pain but does have chronic dyspnea on exertion. Walking is limited not only by dyspnea but also by leg discomfort. Has history of ypertension, hyperlipidemia, gastroesophageal reflux disease. No previous myocardial infarction reported. Other systems negative an a complete reiew of systems other than type 1 diabetes mellitus and tobacco abuse     Objective:   Physical Exam BP 136/53 mmHg  Pulse 104  Ht 5' 1"  (1.549 m)  Wt 177 lb 6.4 oz (80.468 kg)  BMI 33.54 kg/m2  SpO2 97%  Gen.-alert and oriented x3 in no apparent distress HEENT normal for age - obese Lungs no rhonchi or wheezing Cardiovascular regular rhythm no murmurs carotid pulses 3+ palpable no bruits audible Abdomen soft nontender no palpable masses Musculoskeletal free of  major deformities Skin clear -no rashes Neurologic normal Lower extremities 3+ femoral and  2+ dorsalis pedis pulse palpable in the right leg. Dry gangrene tip of right fifth toe with some mild erythema proximally. No purulence or fluctuance noted.  left leg free of any ischemic ulcers. 3+ femoral and posterior tibial pulse palpable left foot.  Today I ordered lower extremity arterial study which I reviewed and interpreted. There is triphasic flow in the left foot and biphasic flow in the right foot with fairly good waveforms. ABI on the right posterior tibial is 0.60 in the left is 0.90.        Assessment:      traumatic injury right fifth toe with broken glass but no evidence of retained foreign body on plain x-ray and no evidence of osteomyelitis -resulting in gangrene tip right fifth toe  PAD with suspected mild to moderate right superficial femoral and tibial occlusive disease -ABI equals 0.60 but palpable dorsalis pedis pulse Type 1 diabetes mellitus Hypertension Hyperlipidemia  GERD  Plan:      we'll schedule patient for aortobifemoral angiogram with right leg runoff to see if PTA and stenting of superficial femoral or popliteal lesion might be feasible or indicated. This will occur on Thursday March 16 by Dr. Bridgett Larsson    if no evidence of potential for endovascular intervention and patient has open in-line flow to foot will then probably need to perform right fifth toe amputation in the near future possibly next week Await results of angiograpy

## 2015-04-12 NOTE — Progress Notes (Signed)
Site area: left goin Site Prior to Removal:  Level 0 Pressure Applied For:  25 minutes Manual:   yes Patient Status During Pull:  stable Post Pull Site:  Level  0 Post Pull Instructions Given:  yes Post Pull Pulses Present:  yes Dressing Applied:  tegaderm Bedrest begins @  [id number] Comments:

## 2015-04-12 NOTE — Interval H&P Note (Signed)
[facility] [place]  History and Physical Update  The patient was interviewed and re-examined.  The patient's previous History and Physical has been reviewed and is unchanged from Dr. Evelena Leyden consult.  There is no change in the plan of care: aortogram, bilateral leg runoff, and possible right leg intervention.  I discussed with the patient the nature of angiographic procedures, especially the limited patencies of any endovascular intervention.   The patient is aware of that the risks of an angiographic procedure include but are not limited to: bleeding, infection, access site complications, renal failure, embolization, rupture of vessel, dissection, arteriovenous fistula, possible need for emergent surgical intervention, possible need for surgical procedures to treat the patient's pathology, anaphylactic reaction to contrast, and stroke and death.  The patient is aware of the risks and agrees to proceed.   Adele Barthel, MD [facility] [place] Office: [phone] Pager: [phone]  04/12/2015, 7:20 AM

## 2015-04-12 NOTE — Op Note (Signed)
OPERATIVE NOTE   PROCEDURE: 1.  Left common femoral artery cannulation under ultrasound guidance 2.  Placement of catheter in aorta 3.  Aortogram 4.  Conscious sedation for 63 minutes 5.  Second order selection 6.  Right leg runoff via catheter 7.  Angioplasty of right superficial femoral artery with bland angioplasty ballon (5 mm x 40 mm) 8.  Drug coat angioplasty of right superficial femoral artery with Lutonix (5 mm x 40 mm) 9.  Left leg runoff via left femoral sheath  PRE-OPERATIVE DIAGNOSIS: right toe gangrene  POST-OPERATIVE DIAGNOSIS: same as above   SURGEON: Adele Barthel, MD  ANESTHESIA: conscious sedation  ESTIMATED BLOOD LOSS: 50 cc  CONTRAST: 150 cc  FINDING(S):  Aorta: patent  Superior mesenteric artery: patent Celiac artery: patent   Right Left  RA patent patent  CIA patent patent  EIA patent patent  IIA Patent with <30% stenosis Patent with <50% stenosis  CFA patent patent  SFA Patent with 75-90% stenosis in proximal 1/3: near resolved after serial angioplasty patent  PFA patent patent  Pop patent patent  Trif patent patent  AT Patent, intact runoff, small distally Patent, intact runoff, small distally  Pero Patent, co-dominant runoff Patent, intact runoff, small distally  PT Patent, co-dominant runoff Patent, dominant runoff   SPECIMEN(S):  none  INDICATIONS:   Yvonne Reynolds is a 70 y.o. female who presents with right toe gangrene.  The patient presents for: aortogram, bilateral leg runoff, and possible right leg runoff.  I discussed with the patient the nature of angiographic procedures, especially the limited patencies of any endovascular intervention.  The patient is aware of that the risks of an angiographic procedure include but are not limited to: bleeding, infection, access site complications, renal failure, embolization, rupture of vessel, dissection, possible need for emergent surgical intervention, possible need for surgical procedures  to treat the patient's pathology, and stroke and death.  The patient is aware of the risks and agrees to proceed.  DESCRIPTION: After full informed consent was obtained from the patient, the patient was brought back to the angiography suite.  The patient was placed supine upon the angiography table and connected to cardiopulmonary monitoring equipment.  The patient was then given conscious sedation, the amounts of which are documented in the patient's chart.  A circulating radiologic technician maintained continuous monitoring of the patient's cardiopulmonary status.  Additionally, the control room radiologic technician provided backup monitoring throughout the procedure.  The patient was prepped and drape in the standard fashion for an angiographic procedure.  At this point, attention was turned to the left groin.  Under ultrasound guidance, the subcutaneous tissue surrounding the left common femoral artery was anesthesized with 1% lidocaine with epinephrine.  Note, this patient does NOT have an allergy to local anesthesia.  She routinely gets local anesthesia for dental work.    The artery was then cannulated with a micropuncture needle.  The microwire was advanced into the iliac arterial system.  The needle was exchanged for a microsheath, which was loaded into the common femoral artery over the wire.  The microwire was exchanged for a [facility] wire which was advanced into the aorta.  The microsheath was then exchanged for a 5-Fr sheath which was loaded into the common femoral artery.  The Omniflush catheter was then loaded over the wire up to the level of L1.  The catheter was connected to the power injector circuit.  After de-airring and de-clotting the circuit, a power injector aortogram was completed.  The [facility] wire was replaced in the catheter, and using the [place] and Omniflush catheter, the right common iliac artery was selected.  Neither would advance so I exchanged both for a Glidewire which I  placed into the right external iliac artery and then the catheter was exchanged for a straight catheter.  I did a hand injection which demonstrated that a collateral vessel had been selected, so I pulled back the catheter and redirected the wire down the right external iliac artery.  The wire was removed and then the catheter connected to the power injector circuit.  An automated right leg runoff was completed.  The findings are listed above.  Based on this patient's history and abnormal ABI, the only lesion needing correction is the Right superficial femoral artery stenosis.  The wire was exchanged for a Rosen wire.  The patient's left femoral sheath was exchanged for a 6-Fr Destination sheath, which was lodged in the right superficial femoral artery.  The dilator was removed.  The patient was given 8000 units of Heparin intravenously, which was a therapeutic bolus.  I did a magnified injection of the proximal thigh to better identify the position and size of the stenosis.  I exchanged the wire for a Versacore.  Using this and a straight catheter, I navigated through the stenosis.  I removed the catheter and then placed a 5 mm x 40 mm non-drug coated balloon centered on the stenosis and inflated to 10 atm for 1 minute.  I then exchanged the balloon for a Lutonix 5 mm x 40 mm drug coated balloon.  This was centered on the stenosis and inflated to 6 atm for 2 minutes.  I then dropped the balloon down to 3 atm for 2 minutes.  The balloon was removed.  The completion angiogram demonstrated near resolution of the stenosis.  I also did another injection at the trifurcation which demonstrated no embolization.  The wire and sheath were pulled back in the left external iliac artery.  The sheath was connected to the power injector circuit.  An automated left leg runoff was completed.  The findings are listed above.   COMPLICATIONS: none  CONDITION: stable   Adele Barthel, MD [facility]  [place] Office: [phone] Pager: [phone]  04/12/2015, 8:56 AM

## 2015-04-12 NOTE — Discharge Instructions (Signed)
Angiogram, Care After °Refer to this sheet in the next few weeks. These instructions provide you with information about caring for yourself after your procedure. Your health care provider may also give you more specific instructions. Your treatment has been planned according to current medical practices, but problems sometimes occur. Call your health care provider if you have any problems or questions after your procedure. °WHAT TO EXPECT AFTER THE PROCEDURE °After your procedure, it is typical to have the following: °· Bruising at the catheter insertion site that usually fades within 1-2 weeks. °· Blood collecting in the tissue (hematoma) that may be painful to the touch. It should usually decrease in size and tenderness within 1-2 weeks. °HOME CARE INSTRUCTIONS °· Take medicines only as directed by your health care provider. °· You may shower 24-48 hours after the procedure or as directed by your health care provider. Remove the bandage (dressing) and gently wash the site with plain soap and water. Pat the area dry with a clean towel. Do not rub the site, because this may cause bleeding. °· Do not take baths, swim, or use a hot tub until your health care provider approves. °· Check your insertion site every day for redness, swelling, or drainage. °· Do not apply powder or lotion to the site. °· Do not lift over 10 lb (4.5 kg) for 5 days after your procedure or as directed by your health care provider. °· Ask your health care provider when it is okay to: °¨ Return to work or school. °¨ Resume usual physical activities or sports. °¨ Resume sexual activity. °· Do not drive home if you are discharged the same day as the procedure. Have someone else drive you. °· You may drive 24 hours after the procedure unless otherwise instructed by your health care provider. °· Do not operate machinery or power tools for 24 hours after the procedure or as directed by your health care provider. °· If your procedure was done as an  outpatient procedure, which means that you went home the same day as your procedure, a responsible adult should be with you for the first 24 hours after you arrive home. °· Keep all follow-up visits as directed by your health care provider. This is important. °SEEK MEDICAL CARE IF: °· You have a fever. °· You have chills. °· You have increased bleeding from the catheter insertion site. Hold pressure on the site. °SEEK IMMEDIATE MEDICAL CARE IF: °· You have unusual pain at the catheter insertion site. °· You have redness, warmth, or swelling at the catheter insertion site. °· You have drainage (other than a small amount of blood on the dressing) from the catheter insertion site. °· The catheter insertion site is bleeding, and the bleeding does not stop after 30 minutes of holding steady pressure on the site. °· The area near or just beyond the catheter insertion site becomes pale, cool, tingly, or numb. °  °This information is not intended to replace advice given to you by your health care provider. Make sure you discuss any questions you have with your health care provider. °  °Document Released: 08/01/2004 Document Revised: 02/03/2014 Document Reviewed: 06/16/2012 °Elsevier Interactive Patient Education ©2016 [employer] Inc[employer] ° °

## 2015-04-13 ENCOUNTER — Encounter (HOSPITAL_COMMUNITY): Payer: Self-pay | Admitting: Vascular Surgery

## 2015-04-17 DIAGNOSIS — Z87891 Personal history of nicotine dependence: Secondary | ICD-10-CM | POA: Diagnosis not present

## 2015-04-17 DIAGNOSIS — I70235 Atherosclerosis of native arteries of right leg with ulceration of other part of foot: Secondary | ICD-10-CM | POA: Diagnosis not present

## 2015-04-17 DIAGNOSIS — F40240 Claustrophobia: Secondary | ICD-10-CM | POA: Diagnosis not present

## 2015-04-17 DIAGNOSIS — E1152 Type 2 diabetes mellitus with diabetic peripheral angiopathy with gangrene: Secondary | ICD-10-CM | POA: Diagnosis not present

## 2015-04-17 DIAGNOSIS — F419 Anxiety disorder, unspecified: Secondary | ICD-10-CM | POA: Diagnosis not present

## 2015-04-17 DIAGNOSIS — L97511 Non-pressure chronic ulcer of other part of right foot limited to breakdown of skin: Secondary | ICD-10-CM | POA: Diagnosis not present

## 2015-04-17 DIAGNOSIS — M1990 Unspecified osteoarthritis, unspecified site: Secondary | ICD-10-CM | POA: Diagnosis not present

## 2015-04-17 DIAGNOSIS — E11621 Type 2 diabetes mellitus with foot ulcer: Secondary | ICD-10-CM | POA: Diagnosis not present

## 2015-04-17 DIAGNOSIS — I10 Essential (primary) hypertension: Secondary | ICD-10-CM | POA: Diagnosis not present

## 2015-04-24 DIAGNOSIS — Z87891 Personal history of nicotine dependence: Secondary | ICD-10-CM | POA: Diagnosis not present

## 2015-04-24 DIAGNOSIS — F40240 Claustrophobia: Secondary | ICD-10-CM | POA: Diagnosis not present

## 2015-04-24 DIAGNOSIS — M1990 Unspecified osteoarthritis, unspecified site: Secondary | ICD-10-CM | POA: Diagnosis not present

## 2015-04-24 DIAGNOSIS — I10 Essential (primary) hypertension: Secondary | ICD-10-CM | POA: Diagnosis not present

## 2015-04-24 DIAGNOSIS — I70235 Atherosclerosis of native arteries of right leg with ulceration of other part of foot: Secondary | ICD-10-CM | POA: Diagnosis not present

## 2015-04-24 DIAGNOSIS — E1152 Type 2 diabetes mellitus with diabetic peripheral angiopathy with gangrene: Secondary | ICD-10-CM | POA: Diagnosis not present

## 2015-04-24 DIAGNOSIS — L97511 Non-pressure chronic ulcer of other part of right foot limited to breakdown of skin: Secondary | ICD-10-CM | POA: Diagnosis not present

## 2015-04-24 DIAGNOSIS — F419 Anxiety disorder, unspecified: Secondary | ICD-10-CM | POA: Diagnosis not present

## 2015-04-24 DIAGNOSIS — E11621 Type 2 diabetes mellitus with foot ulcer: Secondary | ICD-10-CM | POA: Diagnosis not present

## 2015-04-25 ENCOUNTER — Other Ambulatory Visit: Payer: Self-pay | Admitting: Endocrinology

## 2015-04-25 DIAGNOSIS — I739 Peripheral vascular disease, unspecified: Secondary | ICD-10-CM

## 2015-04-26 DIAGNOSIS — F40240 Claustrophobia: Secondary | ICD-10-CM | POA: Diagnosis not present

## 2015-04-26 DIAGNOSIS — I10 Essential (primary) hypertension: Secondary | ICD-10-CM | POA: Diagnosis not present

## 2015-04-26 DIAGNOSIS — L97511 Non-pressure chronic ulcer of other part of right foot limited to breakdown of skin: Secondary | ICD-10-CM | POA: Diagnosis not present

## 2015-04-26 DIAGNOSIS — Z87891 Personal history of nicotine dependence: Secondary | ICD-10-CM | POA: Diagnosis not present

## 2015-04-26 DIAGNOSIS — E11621 Type 2 diabetes mellitus with foot ulcer: Secondary | ICD-10-CM | POA: Diagnosis not present

## 2015-04-26 DIAGNOSIS — M1990 Unspecified osteoarthritis, unspecified site: Secondary | ICD-10-CM | POA: Diagnosis not present

## 2015-04-26 DIAGNOSIS — F419 Anxiety disorder, unspecified: Secondary | ICD-10-CM | POA: Diagnosis not present

## 2015-04-26 DIAGNOSIS — I70235 Atherosclerosis of native arteries of right leg with ulceration of other part of foot: Secondary | ICD-10-CM | POA: Diagnosis not present

## 2015-04-27 DIAGNOSIS — F40240 Claustrophobia: Secondary | ICD-10-CM | POA: Diagnosis not present

## 2015-04-27 DIAGNOSIS — I10 Essential (primary) hypertension: Secondary | ICD-10-CM | POA: Diagnosis not present

## 2015-04-27 DIAGNOSIS — F419 Anxiety disorder, unspecified: Secondary | ICD-10-CM | POA: Diagnosis not present

## 2015-04-27 DIAGNOSIS — M1990 Unspecified osteoarthritis, unspecified site: Secondary | ICD-10-CM | POA: Diagnosis not present

## 2015-04-27 DIAGNOSIS — Z87891 Personal history of nicotine dependence: Secondary | ICD-10-CM | POA: Diagnosis not present

## 2015-04-27 DIAGNOSIS — E11621 Type 2 diabetes mellitus with foot ulcer: Secondary | ICD-10-CM | POA: Diagnosis not present

## 2015-04-27 DIAGNOSIS — L97511 Non-pressure chronic ulcer of other part of right foot limited to breakdown of skin: Secondary | ICD-10-CM | POA: Diagnosis not present

## 2015-04-27 DIAGNOSIS — I70235 Atherosclerosis of native arteries of right leg with ulceration of other part of foot: Secondary | ICD-10-CM | POA: Diagnosis not present

## 2015-04-27 LAB — GLUCOSE, CAPILLARY
GLUCOSE-CAPILLARY: 310 mg/dL — AB (ref 65–99)
GLUCOSE-CAPILLARY: 181 mg/dL — AB (ref 65–99)

## 2015-04-30 ENCOUNTER — Encounter (HOSPITAL_BASED_OUTPATIENT_CLINIC_OR_DEPARTMENT_OTHER): Payer: Commercial Managed Care - HMO | Attending: Surgery

## 2015-04-30 DIAGNOSIS — L97511 Non-pressure chronic ulcer of other part of right foot limited to breakdown of skin: Secondary | ICD-10-CM | POA: Diagnosis not present

## 2015-04-30 DIAGNOSIS — E11621 Type 2 diabetes mellitus with foot ulcer: Secondary | ICD-10-CM | POA: Diagnosis not present

## 2015-04-30 DIAGNOSIS — G40909 Epilepsy, unspecified, not intractable, without status epilepticus: Secondary | ICD-10-CM | POA: Insufficient documentation

## 2015-04-30 DIAGNOSIS — Z87891 Personal history of nicotine dependence: Secondary | ICD-10-CM | POA: Insufficient documentation

## 2015-04-30 DIAGNOSIS — I70235 Atherosclerosis of native arteries of right leg with ulceration of other part of foot: Secondary | ICD-10-CM | POA: Insufficient documentation

## 2015-04-30 DIAGNOSIS — H409 Unspecified glaucoma: Secondary | ICD-10-CM | POA: Insufficient documentation

## 2015-04-30 DIAGNOSIS — M810 Age-related osteoporosis without current pathological fracture: Secondary | ICD-10-CM | POA: Diagnosis not present

## 2015-04-30 DIAGNOSIS — E1152 Type 2 diabetes mellitus with diabetic peripheral angiopathy with gangrene: Secondary | ICD-10-CM | POA: Diagnosis not present

## 2015-04-30 DIAGNOSIS — E7800 Pure hypercholesterolemia, unspecified: Secondary | ICD-10-CM | POA: Insufficient documentation

## 2015-04-30 DIAGNOSIS — I10 Essential (primary) hypertension: Secondary | ICD-10-CM | POA: Diagnosis not present

## 2015-04-30 LAB — GLUCOSE, CAPILLARY
GLUCOSE-CAPILLARY: 209 mg/dL — AB (ref 65–99)
GLUCOSE-CAPILLARY: 131 mg/dL — AB (ref 65–99)
Glucose-Capillary: 220 mg/dL — ABNORMAL HIGH (ref 65–99)
Glucose-Capillary: 137 mg/dL — ABNORMAL HIGH (ref 65–99)

## 2015-05-01 ENCOUNTER — Encounter: Payer: Self-pay | Admitting: Vascular Surgery

## 2015-05-01 DIAGNOSIS — I10 Essential (primary) hypertension: Secondary | ICD-10-CM | POA: Diagnosis not present

## 2015-05-01 DIAGNOSIS — E11621 Type 2 diabetes mellitus with foot ulcer: Secondary | ICD-10-CM | POA: Diagnosis not present

## 2015-05-01 DIAGNOSIS — H409 Unspecified glaucoma: Secondary | ICD-10-CM | POA: Diagnosis not present

## 2015-05-01 DIAGNOSIS — E1152 Type 2 diabetes mellitus with diabetic peripheral angiopathy with gangrene: Secondary | ICD-10-CM | POA: Diagnosis not present

## 2015-05-01 DIAGNOSIS — E7800 Pure hypercholesterolemia, unspecified: Secondary | ICD-10-CM | POA: Diagnosis not present

## 2015-05-01 DIAGNOSIS — I70235 Atherosclerosis of native arteries of right leg with ulceration of other part of foot: Secondary | ICD-10-CM | POA: Diagnosis not present

## 2015-05-01 DIAGNOSIS — L97511 Non-pressure chronic ulcer of other part of right foot limited to breakdown of skin: Secondary | ICD-10-CM | POA: Diagnosis not present

## 2015-05-01 DIAGNOSIS — G40909 Epilepsy, unspecified, not intractable, without status epilepticus: Secondary | ICD-10-CM | POA: Diagnosis not present

## 2015-05-01 DIAGNOSIS — M810 Age-related osteoporosis without current pathological fracture: Secondary | ICD-10-CM | POA: Diagnosis not present

## 2015-05-01 LAB — GLUCOSE, CAPILLARY
Glucose-Capillary: 166 mg/dL — ABNORMAL HIGH (ref 65–99)
Glucose-Capillary: 197 mg/dL — ABNORMAL HIGH (ref 65–99)
Glucose-Capillary: 65 mg/dL (ref 65–99)
Glucose-Capillary: 68 mg/dL (ref 65–99)

## 2015-05-02 ENCOUNTER — Telehealth: Payer: Self-pay | Admitting: Endocrinology

## 2015-05-02 DIAGNOSIS — I70235 Atherosclerosis of native arteries of right leg with ulceration of other part of foot: Secondary | ICD-10-CM | POA: Diagnosis not present

## 2015-05-02 DIAGNOSIS — E7800 Pure hypercholesterolemia, unspecified: Secondary | ICD-10-CM | POA: Diagnosis not present

## 2015-05-02 DIAGNOSIS — E1152 Type 2 diabetes mellitus with diabetic peripheral angiopathy with gangrene: Secondary | ICD-10-CM | POA: Diagnosis not present

## 2015-05-02 DIAGNOSIS — E11621 Type 2 diabetes mellitus with foot ulcer: Secondary | ICD-10-CM | POA: Diagnosis not present

## 2015-05-02 DIAGNOSIS — L97511 Non-pressure chronic ulcer of other part of right foot limited to breakdown of skin: Secondary | ICD-10-CM | POA: Diagnosis not present

## 2015-05-02 DIAGNOSIS — G40909 Epilepsy, unspecified, not intractable, without status epilepticus: Secondary | ICD-10-CM | POA: Diagnosis not present

## 2015-05-02 DIAGNOSIS — M810 Age-related osteoporosis without current pathological fracture: Secondary | ICD-10-CM | POA: Diagnosis not present

## 2015-05-02 DIAGNOSIS — I10 Essential (primary) hypertension: Secondary | ICD-10-CM | POA: Diagnosis not present

## 2015-05-02 DIAGNOSIS — H409 Unspecified glaucoma: Secondary | ICD-10-CM | POA: Diagnosis not present

## 2015-05-02 LAB — GLUCOSE, CAPILLARY
Glucose-Capillary: 105 mg/dL — ABNORMAL HIGH (ref 65–99)
Glucose-Capillary: 144 mg/dL — ABNORMAL HIGH (ref 65–99)
Glucose-Capillary: 140 mg/dL — ABNORMAL HIGH (ref 65–99)

## 2015-05-02 NOTE — Telephone Encounter (Signed)
Pt was advised we referred her due PAD. Pt verbalized understanding on this. Pt was advised to contact the vein specialist back about rescheduling because we would not reschedule this appointment for her. Pt voiced understanding.

## 2015-05-02 NOTE — Telephone Encounter (Signed)
Pt needs to know why she is going to the vein specialist and why when she called the vein place to reschedule they told her they wont reschedule the appt i gave her the # and told her to try again

## 2015-05-03 ENCOUNTER — Ambulatory Visit (HOSPITAL_COMMUNITY): Admission: RE | Admit: 2015-05-03 | Payer: Commercial Managed Care - HMO | Source: Ambulatory Visit

## 2015-05-03 DIAGNOSIS — G40909 Epilepsy, unspecified, not intractable, without status epilepticus: Secondary | ICD-10-CM | POA: Diagnosis not present

## 2015-05-03 DIAGNOSIS — I10 Essential (primary) hypertension: Secondary | ICD-10-CM | POA: Diagnosis not present

## 2015-05-03 DIAGNOSIS — H409 Unspecified glaucoma: Secondary | ICD-10-CM | POA: Diagnosis not present

## 2015-05-03 DIAGNOSIS — E11621 Type 2 diabetes mellitus with foot ulcer: Secondary | ICD-10-CM | POA: Diagnosis not present

## 2015-05-03 DIAGNOSIS — I70235 Atherosclerosis of native arteries of right leg with ulceration of other part of foot: Secondary | ICD-10-CM | POA: Diagnosis not present

## 2015-05-03 DIAGNOSIS — L97511 Non-pressure chronic ulcer of other part of right foot limited to breakdown of skin: Secondary | ICD-10-CM | POA: Diagnosis not present

## 2015-05-03 DIAGNOSIS — E7800 Pure hypercholesterolemia, unspecified: Secondary | ICD-10-CM | POA: Diagnosis not present

## 2015-05-03 DIAGNOSIS — M810 Age-related osteoporosis without current pathological fracture: Secondary | ICD-10-CM | POA: Diagnosis not present

## 2015-05-03 DIAGNOSIS — E1152 Type 2 diabetes mellitus with diabetic peripheral angiopathy with gangrene: Secondary | ICD-10-CM | POA: Diagnosis not present

## 2015-05-03 LAB — GLUCOSE, CAPILLARY
Glucose-Capillary: 135 mg/dL — ABNORMAL HIGH (ref 65–99)
Glucose-Capillary: 75 mg/dL (ref 65–99)

## 2015-05-04 ENCOUNTER — Other Ambulatory Visit: Payer: Self-pay | Admitting: Endocrinology

## 2015-05-04 DIAGNOSIS — I70235 Atherosclerosis of native arteries of right leg with ulceration of other part of foot: Secondary | ICD-10-CM | POA: Diagnosis not present

## 2015-05-04 DIAGNOSIS — L97511 Non-pressure chronic ulcer of other part of right foot limited to breakdown of skin: Secondary | ICD-10-CM | POA: Diagnosis not present

## 2015-05-04 DIAGNOSIS — E7800 Pure hypercholesterolemia, unspecified: Secondary | ICD-10-CM | POA: Diagnosis not present

## 2015-05-04 DIAGNOSIS — E1152 Type 2 diabetes mellitus with diabetic peripheral angiopathy with gangrene: Secondary | ICD-10-CM | POA: Diagnosis not present

## 2015-05-04 DIAGNOSIS — E11621 Type 2 diabetes mellitus with foot ulcer: Secondary | ICD-10-CM | POA: Diagnosis not present

## 2015-05-04 DIAGNOSIS — H409 Unspecified glaucoma: Secondary | ICD-10-CM | POA: Diagnosis not present

## 2015-05-04 DIAGNOSIS — I10 Essential (primary) hypertension: Secondary | ICD-10-CM | POA: Diagnosis not present

## 2015-05-04 DIAGNOSIS — G40909 Epilepsy, unspecified, not intractable, without status epilepticus: Secondary | ICD-10-CM | POA: Diagnosis not present

## 2015-05-04 DIAGNOSIS — M810 Age-related osteoporosis without current pathological fracture: Secondary | ICD-10-CM | POA: Diagnosis not present

## 2015-05-04 LAB — GLUCOSE, CAPILLARY
GLUCOSE-CAPILLARY: 226 mg/dL — AB (ref 65–99)
Glucose-Capillary: 164 mg/dL — ABNORMAL HIGH (ref 65–99)

## 2015-05-07 DIAGNOSIS — I10 Essential (primary) hypertension: Secondary | ICD-10-CM | POA: Diagnosis not present

## 2015-05-07 DIAGNOSIS — E7800 Pure hypercholesterolemia, unspecified: Secondary | ICD-10-CM | POA: Diagnosis not present

## 2015-05-07 DIAGNOSIS — I70235 Atherosclerosis of native arteries of right leg with ulceration of other part of foot: Secondary | ICD-10-CM | POA: Diagnosis not present

## 2015-05-07 DIAGNOSIS — L97511 Non-pressure chronic ulcer of other part of right foot limited to breakdown of skin: Secondary | ICD-10-CM | POA: Diagnosis not present

## 2015-05-07 DIAGNOSIS — H409 Unspecified glaucoma: Secondary | ICD-10-CM | POA: Diagnosis not present

## 2015-05-07 DIAGNOSIS — M810 Age-related osteoporosis without current pathological fracture: Secondary | ICD-10-CM | POA: Diagnosis not present

## 2015-05-07 DIAGNOSIS — G40909 Epilepsy, unspecified, not intractable, without status epilepticus: Secondary | ICD-10-CM | POA: Diagnosis not present

## 2015-05-07 DIAGNOSIS — E1152 Type 2 diabetes mellitus with diabetic peripheral angiopathy with gangrene: Secondary | ICD-10-CM | POA: Diagnosis not present

## 2015-05-07 DIAGNOSIS — E11621 Type 2 diabetes mellitus with foot ulcer: Secondary | ICD-10-CM | POA: Diagnosis not present

## 2015-05-07 LAB — GLUCOSE, CAPILLARY
Glucose-Capillary: 336 mg/dL — ABNORMAL HIGH (ref 65–99)
Glucose-Capillary: 241 mg/dL — ABNORMAL HIGH (ref 65–99)

## 2015-05-08 ENCOUNTER — Ambulatory Visit: Payer: Commercial Managed Care - HMO | Admitting: Vascular Surgery

## 2015-05-08 DIAGNOSIS — I70235 Atherosclerosis of native arteries of right leg with ulceration of other part of foot: Secondary | ICD-10-CM | POA: Diagnosis not present

## 2015-05-08 DIAGNOSIS — G40909 Epilepsy, unspecified, not intractable, without status epilepticus: Secondary | ICD-10-CM | POA: Diagnosis not present

## 2015-05-08 DIAGNOSIS — M810 Age-related osteoporosis without current pathological fracture: Secondary | ICD-10-CM | POA: Diagnosis not present

## 2015-05-08 DIAGNOSIS — E1152 Type 2 diabetes mellitus with diabetic peripheral angiopathy with gangrene: Secondary | ICD-10-CM | POA: Diagnosis not present

## 2015-05-08 DIAGNOSIS — H409 Unspecified glaucoma: Secondary | ICD-10-CM | POA: Diagnosis not present

## 2015-05-08 DIAGNOSIS — L97511 Non-pressure chronic ulcer of other part of right foot limited to breakdown of skin: Secondary | ICD-10-CM | POA: Diagnosis not present

## 2015-05-08 DIAGNOSIS — I10 Essential (primary) hypertension: Secondary | ICD-10-CM | POA: Diagnosis not present

## 2015-05-08 DIAGNOSIS — E11621 Type 2 diabetes mellitus with foot ulcer: Secondary | ICD-10-CM | POA: Diagnosis not present

## 2015-05-08 DIAGNOSIS — E7800 Pure hypercholesterolemia, unspecified: Secondary | ICD-10-CM | POA: Diagnosis not present

## 2015-05-08 LAB — GLUCOSE, CAPILLARY
Glucose-Capillary: 288 mg/dL — ABNORMAL HIGH (ref 65–99)
Glucose-Capillary: 206 mg/dL — ABNORMAL HIGH (ref 65–99)

## 2015-05-09 DIAGNOSIS — M810 Age-related osteoporosis without current pathological fracture: Secondary | ICD-10-CM | POA: Diagnosis not present

## 2015-05-09 DIAGNOSIS — I10 Essential (primary) hypertension: Secondary | ICD-10-CM | POA: Diagnosis not present

## 2015-05-09 DIAGNOSIS — E1152 Type 2 diabetes mellitus with diabetic peripheral angiopathy with gangrene: Secondary | ICD-10-CM | POA: Diagnosis not present

## 2015-05-09 DIAGNOSIS — E7800 Pure hypercholesterolemia, unspecified: Secondary | ICD-10-CM | POA: Diagnosis not present

## 2015-05-09 DIAGNOSIS — I70235 Atherosclerosis of native arteries of right leg with ulceration of other part of foot: Secondary | ICD-10-CM | POA: Diagnosis not present

## 2015-05-09 DIAGNOSIS — H409 Unspecified glaucoma: Secondary | ICD-10-CM | POA: Diagnosis not present

## 2015-05-09 DIAGNOSIS — L97511 Non-pressure chronic ulcer of other part of right foot limited to breakdown of skin: Secondary | ICD-10-CM | POA: Diagnosis not present

## 2015-05-09 DIAGNOSIS — E11621 Type 2 diabetes mellitus with foot ulcer: Secondary | ICD-10-CM | POA: Diagnosis not present

## 2015-05-09 DIAGNOSIS — G40909 Epilepsy, unspecified, not intractable, without status epilepticus: Secondary | ICD-10-CM | POA: Diagnosis not present

## 2015-05-09 LAB — GLUCOSE, CAPILLARY
GLUCOSE-CAPILLARY: 152 mg/dL — AB (ref 65–99)
Glucose-Capillary: 217 mg/dL — ABNORMAL HIGH (ref 65–99)

## 2015-05-10 DIAGNOSIS — E11621 Type 2 diabetes mellitus with foot ulcer: Secondary | ICD-10-CM | POA: Diagnosis not present

## 2015-05-10 DIAGNOSIS — G40909 Epilepsy, unspecified, not intractable, without status epilepticus: Secondary | ICD-10-CM | POA: Diagnosis not present

## 2015-05-10 DIAGNOSIS — I70235 Atherosclerosis of native arteries of right leg with ulceration of other part of foot: Secondary | ICD-10-CM | POA: Diagnosis not present

## 2015-05-10 DIAGNOSIS — H409 Unspecified glaucoma: Secondary | ICD-10-CM | POA: Diagnosis not present

## 2015-05-10 DIAGNOSIS — M810 Age-related osteoporosis without current pathological fracture: Secondary | ICD-10-CM | POA: Diagnosis not present

## 2015-05-10 DIAGNOSIS — I10 Essential (primary) hypertension: Secondary | ICD-10-CM | POA: Diagnosis not present

## 2015-05-10 DIAGNOSIS — L97511 Non-pressure chronic ulcer of other part of right foot limited to breakdown of skin: Secondary | ICD-10-CM | POA: Diagnosis not present

## 2015-05-10 DIAGNOSIS — E7800 Pure hypercholesterolemia, unspecified: Secondary | ICD-10-CM | POA: Diagnosis not present

## 2015-05-10 DIAGNOSIS — E1152 Type 2 diabetes mellitus with diabetic peripheral angiopathy with gangrene: Secondary | ICD-10-CM | POA: Diagnosis not present

## 2015-05-10 LAB — GLUCOSE, CAPILLARY
GLUCOSE-CAPILLARY: 286 mg/dL — AB (ref 65–99)
Glucose-Capillary: 293 mg/dL — ABNORMAL HIGH (ref 65–99)

## 2015-05-11 DIAGNOSIS — H409 Unspecified glaucoma: Secondary | ICD-10-CM | POA: Diagnosis not present

## 2015-05-11 DIAGNOSIS — L97511 Non-pressure chronic ulcer of other part of right foot limited to breakdown of skin: Secondary | ICD-10-CM | POA: Diagnosis not present

## 2015-05-11 DIAGNOSIS — I70235 Atherosclerosis of native arteries of right leg with ulceration of other part of foot: Secondary | ICD-10-CM | POA: Diagnosis not present

## 2015-05-11 DIAGNOSIS — I10 Essential (primary) hypertension: Secondary | ICD-10-CM | POA: Diagnosis not present

## 2015-05-11 DIAGNOSIS — E11621 Type 2 diabetes mellitus with foot ulcer: Secondary | ICD-10-CM | POA: Diagnosis not present

## 2015-05-11 DIAGNOSIS — M810 Age-related osteoporosis without current pathological fracture: Secondary | ICD-10-CM | POA: Diagnosis not present

## 2015-05-11 DIAGNOSIS — G40909 Epilepsy, unspecified, not intractable, without status epilepticus: Secondary | ICD-10-CM | POA: Diagnosis not present

## 2015-05-11 DIAGNOSIS — E7800 Pure hypercholesterolemia, unspecified: Secondary | ICD-10-CM | POA: Diagnosis not present

## 2015-05-11 DIAGNOSIS — E1152 Type 2 diabetes mellitus with diabetic peripheral angiopathy with gangrene: Secondary | ICD-10-CM | POA: Diagnosis not present

## 2015-05-11 LAB — GLUCOSE, CAPILLARY
GLUCOSE-CAPILLARY: 223 mg/dL — AB (ref 65–99)
GLUCOSE-CAPILLARY: 129 mg/dL — AB (ref 65–99)

## 2015-05-14 DIAGNOSIS — I70235 Atherosclerosis of native arteries of right leg with ulceration of other part of foot: Secondary | ICD-10-CM | POA: Diagnosis not present

## 2015-05-14 DIAGNOSIS — E11621 Type 2 diabetes mellitus with foot ulcer: Secondary | ICD-10-CM | POA: Diagnosis not present

## 2015-05-14 DIAGNOSIS — H409 Unspecified glaucoma: Secondary | ICD-10-CM | POA: Diagnosis not present

## 2015-05-14 DIAGNOSIS — G40909 Epilepsy, unspecified, not intractable, without status epilepticus: Secondary | ICD-10-CM | POA: Diagnosis not present

## 2015-05-14 DIAGNOSIS — L97511 Non-pressure chronic ulcer of other part of right foot limited to breakdown of skin: Secondary | ICD-10-CM | POA: Diagnosis not present

## 2015-05-14 DIAGNOSIS — I10 Essential (primary) hypertension: Secondary | ICD-10-CM | POA: Diagnosis not present

## 2015-05-14 DIAGNOSIS — M810 Age-related osteoporosis without current pathological fracture: Secondary | ICD-10-CM | POA: Diagnosis not present

## 2015-05-14 DIAGNOSIS — E7800 Pure hypercholesterolemia, unspecified: Secondary | ICD-10-CM | POA: Diagnosis not present

## 2015-05-14 DIAGNOSIS — E1152 Type 2 diabetes mellitus with diabetic peripheral angiopathy with gangrene: Secondary | ICD-10-CM | POA: Diagnosis not present

## 2015-05-14 LAB — GLUCOSE, CAPILLARY
GLUCOSE-CAPILLARY: 145 mg/dL — AB (ref 65–99)
GLUCOSE-CAPILLARY: 100 mg/dL — AB (ref 65–99)

## 2015-05-15 DIAGNOSIS — E7800 Pure hypercholesterolemia, unspecified: Secondary | ICD-10-CM | POA: Diagnosis not present

## 2015-05-15 DIAGNOSIS — M810 Age-related osteoporosis without current pathological fracture: Secondary | ICD-10-CM | POA: Diagnosis not present

## 2015-05-15 DIAGNOSIS — G40909 Epilepsy, unspecified, not intractable, without status epilepticus: Secondary | ICD-10-CM | POA: Diagnosis not present

## 2015-05-15 DIAGNOSIS — L97511 Non-pressure chronic ulcer of other part of right foot limited to breakdown of skin: Secondary | ICD-10-CM | POA: Diagnosis not present

## 2015-05-15 DIAGNOSIS — I70235 Atherosclerosis of native arteries of right leg with ulceration of other part of foot: Secondary | ICD-10-CM | POA: Diagnosis not present

## 2015-05-15 DIAGNOSIS — I10 Essential (primary) hypertension: Secondary | ICD-10-CM | POA: Diagnosis not present

## 2015-05-15 DIAGNOSIS — E11621 Type 2 diabetes mellitus with foot ulcer: Secondary | ICD-10-CM | POA: Diagnosis not present

## 2015-05-15 DIAGNOSIS — E1152 Type 2 diabetes mellitus with diabetic peripheral angiopathy with gangrene: Secondary | ICD-10-CM | POA: Diagnosis not present

## 2015-05-15 DIAGNOSIS — H409 Unspecified glaucoma: Secondary | ICD-10-CM | POA: Diagnosis not present

## 2015-05-15 LAB — GLUCOSE, CAPILLARY
GLUCOSE-CAPILLARY: 155 mg/dL — AB (ref 65–99)
Glucose-Capillary: 233 mg/dL — ABNORMAL HIGH (ref 65–99)

## 2015-05-16 DIAGNOSIS — M810 Age-related osteoporosis without current pathological fracture: Secondary | ICD-10-CM | POA: Diagnosis not present

## 2015-05-16 DIAGNOSIS — I10 Essential (primary) hypertension: Secondary | ICD-10-CM | POA: Diagnosis not present

## 2015-05-16 DIAGNOSIS — E1152 Type 2 diabetes mellitus with diabetic peripheral angiopathy with gangrene: Secondary | ICD-10-CM | POA: Diagnosis not present

## 2015-05-16 DIAGNOSIS — I70235 Atherosclerosis of native arteries of right leg with ulceration of other part of foot: Secondary | ICD-10-CM | POA: Diagnosis not present

## 2015-05-16 DIAGNOSIS — G40909 Epilepsy, unspecified, not intractable, without status epilepticus: Secondary | ICD-10-CM | POA: Diagnosis not present

## 2015-05-16 DIAGNOSIS — H409 Unspecified glaucoma: Secondary | ICD-10-CM | POA: Diagnosis not present

## 2015-05-16 DIAGNOSIS — E11621 Type 2 diabetes mellitus with foot ulcer: Secondary | ICD-10-CM | POA: Diagnosis not present

## 2015-05-16 DIAGNOSIS — L97511 Non-pressure chronic ulcer of other part of right foot limited to breakdown of skin: Secondary | ICD-10-CM | POA: Diagnosis not present

## 2015-05-16 DIAGNOSIS — E7800 Pure hypercholesterolemia, unspecified: Secondary | ICD-10-CM | POA: Diagnosis not present

## 2015-05-16 LAB — GLUCOSE, CAPILLARY
GLUCOSE-CAPILLARY: 167 mg/dL — AB (ref 65–99)
Glucose-Capillary: 239 mg/dL — ABNORMAL HIGH (ref 65–99)

## 2015-05-17 DIAGNOSIS — I70235 Atherosclerosis of native arteries of right leg with ulceration of other part of foot: Secondary | ICD-10-CM | POA: Diagnosis not present

## 2015-05-17 DIAGNOSIS — E1152 Type 2 diabetes mellitus with diabetic peripheral angiopathy with gangrene: Secondary | ICD-10-CM | POA: Diagnosis not present

## 2015-05-17 DIAGNOSIS — E11621 Type 2 diabetes mellitus with foot ulcer: Secondary | ICD-10-CM | POA: Diagnosis not present

## 2015-05-17 DIAGNOSIS — L97511 Non-pressure chronic ulcer of other part of right foot limited to breakdown of skin: Secondary | ICD-10-CM | POA: Diagnosis not present

## 2015-05-17 DIAGNOSIS — M810 Age-related osteoporosis without current pathological fracture: Secondary | ICD-10-CM | POA: Diagnosis not present

## 2015-05-17 DIAGNOSIS — I10 Essential (primary) hypertension: Secondary | ICD-10-CM | POA: Diagnosis not present

## 2015-05-17 DIAGNOSIS — E7800 Pure hypercholesterolemia, unspecified: Secondary | ICD-10-CM | POA: Diagnosis not present

## 2015-05-17 DIAGNOSIS — G40909 Epilepsy, unspecified, not intractable, without status epilepticus: Secondary | ICD-10-CM | POA: Diagnosis not present

## 2015-05-17 DIAGNOSIS — H409 Unspecified glaucoma: Secondary | ICD-10-CM | POA: Diagnosis not present

## 2015-05-18 DIAGNOSIS — M810 Age-related osteoporosis without current pathological fracture: Secondary | ICD-10-CM | POA: Diagnosis not present

## 2015-05-18 DIAGNOSIS — G40909 Epilepsy, unspecified, not intractable, without status epilepticus: Secondary | ICD-10-CM | POA: Diagnosis not present

## 2015-05-18 DIAGNOSIS — L97511 Non-pressure chronic ulcer of other part of right foot limited to breakdown of skin: Secondary | ICD-10-CM | POA: Diagnosis not present

## 2015-05-18 DIAGNOSIS — E11621 Type 2 diabetes mellitus with foot ulcer: Secondary | ICD-10-CM | POA: Diagnosis not present

## 2015-05-18 DIAGNOSIS — I70235 Atherosclerosis of native arteries of right leg with ulceration of other part of foot: Secondary | ICD-10-CM | POA: Diagnosis not present

## 2015-05-18 DIAGNOSIS — E1152 Type 2 diabetes mellitus with diabetic peripheral angiopathy with gangrene: Secondary | ICD-10-CM | POA: Diagnosis not present

## 2015-05-18 DIAGNOSIS — E7800 Pure hypercholesterolemia, unspecified: Secondary | ICD-10-CM | POA: Diagnosis not present

## 2015-05-18 DIAGNOSIS — I10 Essential (primary) hypertension: Secondary | ICD-10-CM | POA: Diagnosis not present

## 2015-05-18 DIAGNOSIS — H409 Unspecified glaucoma: Secondary | ICD-10-CM | POA: Diagnosis not present

## 2015-05-18 LAB — GLUCOSE, CAPILLARY
GLUCOSE-CAPILLARY: 106 mg/dL — AB (ref 65–99)
GLUCOSE-CAPILLARY: 158 mg/dL — AB (ref 65–99)
GLUCOSE-CAPILLARY: 109 mg/dL — AB (ref 65–99)
Glucose-Capillary: 197 mg/dL — ABNORMAL HIGH (ref 65–99)

## 2015-05-21 DIAGNOSIS — I70235 Atherosclerosis of native arteries of right leg with ulceration of other part of foot: Secondary | ICD-10-CM | POA: Diagnosis not present

## 2015-05-21 DIAGNOSIS — H409 Unspecified glaucoma: Secondary | ICD-10-CM | POA: Diagnosis not present

## 2015-05-21 DIAGNOSIS — L97511 Non-pressure chronic ulcer of other part of right foot limited to breakdown of skin: Secondary | ICD-10-CM | POA: Diagnosis not present

## 2015-05-21 DIAGNOSIS — I10 Essential (primary) hypertension: Secondary | ICD-10-CM | POA: Diagnosis not present

## 2015-05-21 DIAGNOSIS — M810 Age-related osteoporosis without current pathological fracture: Secondary | ICD-10-CM | POA: Diagnosis not present

## 2015-05-21 DIAGNOSIS — E11621 Type 2 diabetes mellitus with foot ulcer: Secondary | ICD-10-CM | POA: Diagnosis not present

## 2015-05-21 DIAGNOSIS — G40909 Epilepsy, unspecified, not intractable, without status epilepticus: Secondary | ICD-10-CM | POA: Diagnosis not present

## 2015-05-21 DIAGNOSIS — E1152 Type 2 diabetes mellitus with diabetic peripheral angiopathy with gangrene: Secondary | ICD-10-CM | POA: Diagnosis not present

## 2015-05-21 DIAGNOSIS — E7800 Pure hypercholesterolemia, unspecified: Secondary | ICD-10-CM | POA: Diagnosis not present

## 2015-05-21 LAB — GLUCOSE, CAPILLARY
GLUCOSE-CAPILLARY: 254 mg/dL — AB (ref 65–99)
Glucose-Capillary: 192 mg/dL — ABNORMAL HIGH (ref 65–99)

## 2015-05-22 DIAGNOSIS — E1152 Type 2 diabetes mellitus with diabetic peripheral angiopathy with gangrene: Secondary | ICD-10-CM | POA: Diagnosis not present

## 2015-05-22 DIAGNOSIS — H409 Unspecified glaucoma: Secondary | ICD-10-CM | POA: Diagnosis not present

## 2015-05-22 DIAGNOSIS — L97511 Non-pressure chronic ulcer of other part of right foot limited to breakdown of skin: Secondary | ICD-10-CM | POA: Diagnosis not present

## 2015-05-22 DIAGNOSIS — I10 Essential (primary) hypertension: Secondary | ICD-10-CM | POA: Diagnosis not present

## 2015-05-22 DIAGNOSIS — G40909 Epilepsy, unspecified, not intractable, without status epilepticus: Secondary | ICD-10-CM | POA: Diagnosis not present

## 2015-05-22 DIAGNOSIS — I70235 Atherosclerosis of native arteries of right leg with ulceration of other part of foot: Secondary | ICD-10-CM | POA: Diagnosis not present

## 2015-05-22 DIAGNOSIS — E11621 Type 2 diabetes mellitus with foot ulcer: Secondary | ICD-10-CM | POA: Diagnosis not present

## 2015-05-22 DIAGNOSIS — E7800 Pure hypercholesterolemia, unspecified: Secondary | ICD-10-CM | POA: Diagnosis not present

## 2015-05-22 DIAGNOSIS — M810 Age-related osteoporosis without current pathological fracture: Secondary | ICD-10-CM | POA: Diagnosis not present

## 2015-05-22 LAB — GLUCOSE, CAPILLARY
GLUCOSE-CAPILLARY: 162 mg/dL — AB (ref 65–99)
GLUCOSE-CAPILLARY: 80 mg/dL (ref 65–99)
GLUCOSE-CAPILLARY: 40 mg/dL — AB (ref 65–99)
GLUCOSE-CAPILLARY: 63 mg/dL — AB (ref 65–99)
Glucose-Capillary: 46 mg/dL — ABNORMAL LOW (ref 65–99)

## 2015-05-23 DIAGNOSIS — L97511 Non-pressure chronic ulcer of other part of right foot limited to breakdown of skin: Secondary | ICD-10-CM | POA: Diagnosis not present

## 2015-05-23 DIAGNOSIS — I70235 Atherosclerosis of native arteries of right leg with ulceration of other part of foot: Secondary | ICD-10-CM | POA: Diagnosis not present

## 2015-05-23 DIAGNOSIS — E11621 Type 2 diabetes mellitus with foot ulcer: Secondary | ICD-10-CM | POA: Diagnosis not present

## 2015-05-23 DIAGNOSIS — E7800 Pure hypercholesterolemia, unspecified: Secondary | ICD-10-CM | POA: Diagnosis not present

## 2015-05-23 DIAGNOSIS — I10 Essential (primary) hypertension: Secondary | ICD-10-CM | POA: Diagnosis not present

## 2015-05-23 DIAGNOSIS — E1152 Type 2 diabetes mellitus with diabetic peripheral angiopathy with gangrene: Secondary | ICD-10-CM | POA: Diagnosis not present

## 2015-05-23 DIAGNOSIS — G40909 Epilepsy, unspecified, not intractable, without status epilepticus: Secondary | ICD-10-CM | POA: Diagnosis not present

## 2015-05-23 DIAGNOSIS — H409 Unspecified glaucoma: Secondary | ICD-10-CM | POA: Diagnosis not present

## 2015-05-23 DIAGNOSIS — M810 Age-related osteoporosis without current pathological fracture: Secondary | ICD-10-CM | POA: Diagnosis not present

## 2015-05-23 LAB — GLUCOSE, CAPILLARY
GLUCOSE-CAPILLARY: 298 mg/dL — AB (ref 65–99)
Glucose-Capillary: 225 mg/dL — ABNORMAL HIGH (ref 65–99)

## 2015-05-24 DIAGNOSIS — E7800 Pure hypercholesterolemia, unspecified: Secondary | ICD-10-CM | POA: Diagnosis not present

## 2015-05-24 DIAGNOSIS — I10 Essential (primary) hypertension: Secondary | ICD-10-CM | POA: Diagnosis not present

## 2015-05-24 DIAGNOSIS — E11621 Type 2 diabetes mellitus with foot ulcer: Secondary | ICD-10-CM | POA: Diagnosis not present

## 2015-05-24 DIAGNOSIS — I70235 Atherosclerosis of native arteries of right leg with ulceration of other part of foot: Secondary | ICD-10-CM | POA: Diagnosis not present

## 2015-05-24 DIAGNOSIS — L97512 Non-pressure chronic ulcer of other part of right foot with fat layer exposed: Secondary | ICD-10-CM | POA: Diagnosis not present

## 2015-05-24 DIAGNOSIS — L97511 Non-pressure chronic ulcer of other part of right foot limited to breakdown of skin: Secondary | ICD-10-CM | POA: Diagnosis not present

## 2015-05-24 DIAGNOSIS — G40909 Epilepsy, unspecified, not intractable, without status epilepticus: Secondary | ICD-10-CM | POA: Diagnosis not present

## 2015-05-24 DIAGNOSIS — H409 Unspecified glaucoma: Secondary | ICD-10-CM | POA: Diagnosis not present

## 2015-05-24 DIAGNOSIS — E1152 Type 2 diabetes mellitus with diabetic peripheral angiopathy with gangrene: Secondary | ICD-10-CM | POA: Diagnosis not present

## 2015-05-24 DIAGNOSIS — M810 Age-related osteoporosis without current pathological fracture: Secondary | ICD-10-CM | POA: Diagnosis not present

## 2015-05-24 LAB — GLUCOSE, CAPILLARY
GLUCOSE-CAPILLARY: 200 mg/dL — AB (ref 65–99)
Glucose-Capillary: 303 mg/dL — ABNORMAL HIGH (ref 65–99)

## 2015-05-25 DIAGNOSIS — G40909 Epilepsy, unspecified, not intractable, without status epilepticus: Secondary | ICD-10-CM | POA: Diagnosis not present

## 2015-05-25 DIAGNOSIS — E11621 Type 2 diabetes mellitus with foot ulcer: Secondary | ICD-10-CM | POA: Diagnosis not present

## 2015-05-25 DIAGNOSIS — H409 Unspecified glaucoma: Secondary | ICD-10-CM | POA: Diagnosis not present

## 2015-05-25 DIAGNOSIS — I10 Essential (primary) hypertension: Secondary | ICD-10-CM | POA: Diagnosis not present

## 2015-05-25 DIAGNOSIS — E1152 Type 2 diabetes mellitus with diabetic peripheral angiopathy with gangrene: Secondary | ICD-10-CM | POA: Diagnosis not present

## 2015-05-25 DIAGNOSIS — L97511 Non-pressure chronic ulcer of other part of right foot limited to breakdown of skin: Secondary | ICD-10-CM | POA: Diagnosis not present

## 2015-05-25 DIAGNOSIS — E7800 Pure hypercholesterolemia, unspecified: Secondary | ICD-10-CM | POA: Diagnosis not present

## 2015-05-25 DIAGNOSIS — M810 Age-related osteoporosis without current pathological fracture: Secondary | ICD-10-CM | POA: Diagnosis not present

## 2015-05-25 DIAGNOSIS — I70235 Atherosclerosis of native arteries of right leg with ulceration of other part of foot: Secondary | ICD-10-CM | POA: Diagnosis not present

## 2015-05-25 LAB — GLUCOSE, CAPILLARY
GLUCOSE-CAPILLARY: 232 mg/dL — AB (ref 65–99)
Glucose-Capillary: 173 mg/dL — ABNORMAL HIGH (ref 65–99)

## 2015-05-28 ENCOUNTER — Encounter (HOSPITAL_BASED_OUTPATIENT_CLINIC_OR_DEPARTMENT_OTHER): Payer: Commercial Managed Care - HMO | Attending: Surgery

## 2015-05-28 DIAGNOSIS — M1990 Unspecified osteoarthritis, unspecified site: Secondary | ICD-10-CM | POA: Insufficient documentation

## 2015-05-28 DIAGNOSIS — L97511 Non-pressure chronic ulcer of other part of right foot limited to breakdown of skin: Secondary | ICD-10-CM | POA: Insufficient documentation

## 2015-05-28 DIAGNOSIS — E11621 Type 2 diabetes mellitus with foot ulcer: Secondary | ICD-10-CM | POA: Insufficient documentation

## 2015-05-28 DIAGNOSIS — I70235 Atherosclerosis of native arteries of right leg with ulceration of other part of foot: Secondary | ICD-10-CM | POA: Insufficient documentation

## 2015-05-28 DIAGNOSIS — I10 Essential (primary) hypertension: Secondary | ICD-10-CM | POA: Insufficient documentation

## 2015-05-28 LAB — GLUCOSE, CAPILLARY
Glucose-Capillary: 134 mg/dL — ABNORMAL HIGH (ref 65–99)
Glucose-Capillary: 115 mg/dL — ABNORMAL HIGH (ref 65–99)

## 2015-05-29 DIAGNOSIS — M1990 Unspecified osteoarthritis, unspecified site: Secondary | ICD-10-CM | POA: Diagnosis not present

## 2015-05-29 DIAGNOSIS — I10 Essential (primary) hypertension: Secondary | ICD-10-CM | POA: Diagnosis not present

## 2015-05-29 DIAGNOSIS — I70235 Atherosclerosis of native arteries of right leg with ulceration of other part of foot: Secondary | ICD-10-CM | POA: Diagnosis not present

## 2015-05-29 DIAGNOSIS — L97511 Non-pressure chronic ulcer of other part of right foot limited to breakdown of skin: Secondary | ICD-10-CM | POA: Diagnosis not present

## 2015-05-29 DIAGNOSIS — E1152 Type 2 diabetes mellitus with diabetic peripheral angiopathy with gangrene: Secondary | ICD-10-CM | POA: Diagnosis not present

## 2015-05-29 DIAGNOSIS — E11621 Type 2 diabetes mellitus with foot ulcer: Secondary | ICD-10-CM | POA: Diagnosis not present

## 2015-05-29 LAB — GLUCOSE, CAPILLARY
GLUCOSE-CAPILLARY: 160 mg/dL — AB (ref 65–99)
Glucose-Capillary: 222 mg/dL — ABNORMAL HIGH (ref 65–99)

## 2015-05-30 ENCOUNTER — Other Ambulatory Visit: Payer: Self-pay | Admitting: Endocrinology

## 2015-05-30 DIAGNOSIS — E11621 Type 2 diabetes mellitus with foot ulcer: Secondary | ICD-10-CM | POA: Diagnosis not present

## 2015-05-30 DIAGNOSIS — I70235 Atherosclerosis of native arteries of right leg with ulceration of other part of foot: Secondary | ICD-10-CM | POA: Diagnosis not present

## 2015-05-30 DIAGNOSIS — I10 Essential (primary) hypertension: Secondary | ICD-10-CM | POA: Diagnosis not present

## 2015-05-30 DIAGNOSIS — L97511 Non-pressure chronic ulcer of other part of right foot limited to breakdown of skin: Secondary | ICD-10-CM | POA: Diagnosis not present

## 2015-05-30 DIAGNOSIS — M1990 Unspecified osteoarthritis, unspecified site: Secondary | ICD-10-CM | POA: Diagnosis not present

## 2015-05-30 DIAGNOSIS — E1152 Type 2 diabetes mellitus with diabetic peripheral angiopathy with gangrene: Secondary | ICD-10-CM | POA: Diagnosis not present

## 2015-05-30 LAB — GLUCOSE, CAPILLARY
GLUCOSE-CAPILLARY: 166 mg/dL — AB (ref 65–99)
Glucose-Capillary: 238 mg/dL — ABNORMAL HIGH (ref 65–99)

## 2015-05-31 DIAGNOSIS — L97511 Non-pressure chronic ulcer of other part of right foot limited to breakdown of skin: Secondary | ICD-10-CM | POA: Diagnosis not present

## 2015-05-31 DIAGNOSIS — I10 Essential (primary) hypertension: Secondary | ICD-10-CM | POA: Diagnosis not present

## 2015-05-31 DIAGNOSIS — E11621 Type 2 diabetes mellitus with foot ulcer: Secondary | ICD-10-CM | POA: Diagnosis not present

## 2015-05-31 DIAGNOSIS — M1990 Unspecified osteoarthritis, unspecified site: Secondary | ICD-10-CM | POA: Diagnosis not present

## 2015-05-31 DIAGNOSIS — I70235 Atherosclerosis of native arteries of right leg with ulceration of other part of foot: Secondary | ICD-10-CM | POA: Diagnosis not present

## 2015-05-31 LAB — GLUCOSE, CAPILLARY
GLUCOSE-CAPILLARY: 289 mg/dL — AB (ref 65–99)
Glucose-Capillary: 249 mg/dL — ABNORMAL HIGH (ref 65–99)

## 2015-06-01 DIAGNOSIS — L97511 Non-pressure chronic ulcer of other part of right foot limited to breakdown of skin: Secondary | ICD-10-CM | POA: Diagnosis not present

## 2015-06-01 DIAGNOSIS — E11621 Type 2 diabetes mellitus with foot ulcer: Secondary | ICD-10-CM | POA: Diagnosis not present

## 2015-06-01 DIAGNOSIS — I10 Essential (primary) hypertension: Secondary | ICD-10-CM | POA: Diagnosis not present

## 2015-06-01 DIAGNOSIS — M1990 Unspecified osteoarthritis, unspecified site: Secondary | ICD-10-CM | POA: Diagnosis not present

## 2015-06-01 DIAGNOSIS — I70235 Atherosclerosis of native arteries of right leg with ulceration of other part of foot: Secondary | ICD-10-CM | POA: Diagnosis not present

## 2015-06-04 DIAGNOSIS — I70235 Atherosclerosis of native arteries of right leg with ulceration of other part of foot: Secondary | ICD-10-CM | POA: Diagnosis not present

## 2015-06-04 DIAGNOSIS — L97511 Non-pressure chronic ulcer of other part of right foot limited to breakdown of skin: Secondary | ICD-10-CM | POA: Diagnosis not present

## 2015-06-04 DIAGNOSIS — I10 Essential (primary) hypertension: Secondary | ICD-10-CM | POA: Diagnosis not present

## 2015-06-04 DIAGNOSIS — E11621 Type 2 diabetes mellitus with foot ulcer: Secondary | ICD-10-CM | POA: Diagnosis not present

## 2015-06-04 DIAGNOSIS — M1990 Unspecified osteoarthritis, unspecified site: Secondary | ICD-10-CM | POA: Diagnosis not present

## 2015-06-04 LAB — GLUCOSE, CAPILLARY
Glucose-Capillary: 270 mg/dL — ABNORMAL HIGH (ref 65–99)
Glucose-Capillary: 222 mg/dL — ABNORMAL HIGH (ref 65–99)

## 2015-06-05 DIAGNOSIS — I70235 Atherosclerosis of native arteries of right leg with ulceration of other part of foot: Secondary | ICD-10-CM | POA: Diagnosis not present

## 2015-06-05 DIAGNOSIS — E11621 Type 2 diabetes mellitus with foot ulcer: Secondary | ICD-10-CM | POA: Diagnosis not present

## 2015-06-05 DIAGNOSIS — I10 Essential (primary) hypertension: Secondary | ICD-10-CM | POA: Diagnosis not present

## 2015-06-05 DIAGNOSIS — L97511 Non-pressure chronic ulcer of other part of right foot limited to breakdown of skin: Secondary | ICD-10-CM | POA: Diagnosis not present

## 2015-06-05 DIAGNOSIS — M1990 Unspecified osteoarthritis, unspecified site: Secondary | ICD-10-CM | POA: Diagnosis not present

## 2015-06-05 DIAGNOSIS — E1152 Type 2 diabetes mellitus with diabetic peripheral angiopathy with gangrene: Secondary | ICD-10-CM | POA: Diagnosis not present

## 2015-06-05 LAB — GLUCOSE, CAPILLARY
GLUCOSE-CAPILLARY: 167 mg/dL — AB (ref 65–99)
Glucose-Capillary: 204 mg/dL — ABNORMAL HIGH (ref 65–99)

## 2015-06-06 LAB — GLUCOSE, CAPILLARY
GLUCOSE-CAPILLARY: 217 mg/dL — AB (ref 65–99)
GLUCOSE-CAPILLARY: 142 mg/dL — AB (ref 65–99)

## 2015-06-12 ENCOUNTER — Encounter: Payer: Self-pay | Admitting: Endocrinology

## 2015-06-12 ENCOUNTER — Ambulatory Visit (INDEPENDENT_AMBULATORY_CARE_PROVIDER_SITE_OTHER): Payer: Commercial Managed Care - HMO | Admitting: Endocrinology

## 2015-06-12 VITALS — BP 136/80 | HR 92 | Ht 61.0 in | Wt 176.0 lb

## 2015-06-12 DIAGNOSIS — E1021 Type 1 diabetes mellitus with diabetic nephropathy: Secondary | ICD-10-CM | POA: Diagnosis not present

## 2015-06-12 LAB — POCT GLYCOSYLATED HEMOGLOBIN (HGB A1C): HEMOGLOBIN A1C: 7.9

## 2015-06-12 MED ORDER — INSULIN NPH (HUMAN) (ISOPHANE) 100 UNIT/ML ~~LOC~~ SUSP: 30.0000 [IU] | SUBCUTANEOUS | Status: DC

## 2015-06-12 MED ORDER — INSULIN REGULAR HUMAN 100 UNIT/ML IJ SOLN: 70.0000 [IU] | Freq: Every day | INTRAMUSCULAR | Status: DC

## 2015-06-12 NOTE — Progress Notes (Signed)
Subjective:    Patient ID: Yvonne Reynolds, female    DOB: 1945/09/03, 70 y.o.   MRN: [id number]  HPI Pt returns for f/u of diabetes mellitus: DM type: Insulin-requiring type 2 Dx'ed: 5790 Complications: retinopathy and nephropathy.  Therapy: insulin since 1999 GDM: never DKA: never Severe hypoglycemia: never Pancreatitis: never.  Other: she chose a simple and inexpensive insulin schedule.   Interval history: no cbg record, but states cbg's are well-controlled, except for mild hypoglycemia in the afternoon.  She thinks she takes the doses of the 2 insulins backwards.   Past Medical History  Diagnosis Date  . History of seizure     x 1 - due to hyperglycemia  . Macular degeneration   . High cholesterol   . Osteoporosis   . Nocturia   . Insulin dependent diabetes mellitus ([place])   . Sebaceous cyst 09/2014    chest wall - sternum  . Hypertension     states under control with med., has been on med. > 20 yr.  . Depression   . GERD (gastroesophageal reflux disease)   . Osteoarthritis     shoulder  . Popping of temporomandibular joint on opening of jaw   . Complication of anesthesia     states was hard to wake up after 1st EGD  . Toenail fungus     Past Surgical History  Procedure Laterality Date  . Abdominal hysterectomy      partial  . Esophagogastroduodenoscopy  05/07/2004  . Cesarean section    . Orif wrist fracture Left   . Orif ankle fracture Left 07/23/2012    Procedure: OPEN REDUCTION INTERNAL FIXATION (ORIF) ANKLE FRACTURE;  Surgeon: Jessy Oto, MD;  Location: [facility];  Service: Orthopedics;  Laterality: Left;  . Esophagogastroduodenoscopy (egd) with propofol  07/06/2013  . Cataract extraction w/ intraocular lens  implant, bilateral Bilateral   . Bladder suspension    . Mass excision N/A 10/17/2014    Procedure: EXCISION 2X3CM SUBCUTANEOUS CHEST WALL CYST;  Surgeon: Rolm Bookbinder, MD;  Location: [facility];  Service: General;  Laterality: N/A;    . Peripheral vascular catheterization N/A 04/12/2015    Procedure: Abdominal Aortogram;  Surgeon: Conrad Gresham, MD;  Location: [facility];  Service: Cardiovascular;  Laterality: N/A;  . Peripheral vascular catheterization Right 04/12/2015    Procedure: Peripheral Vascular Balloon Angioplasty;  Surgeon: Conrad Campbellsburg, MD;  Location: [facility];  Service: Cardiovascular;  Laterality: Right;  rt sfa drug coated balloon  . Lower extremity angiogram Bilateral 04/12/2015    Procedure: Lower Extremity Angiogram;  Surgeon: Conrad Cortland, MD;  Location: [facility];  Service: Cardiovascular;  Laterality: Bilateral;    Social History   Social History  . Marital Status: Widowed    Spouse Name: N/A  . Number of Children: 3  . Years of Education: N/A   Occupational History  . 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 Smoking status: Former Smoker -- 0.00 packs/day    Quit date: 11/11/2011  . Smokeless tobacco: Never Used  . Alcohol Use: No  . Drug Use: No  . Sexual Activity: Not on file   Other Topics Concern  . Not on file   Social History Narrative   Widowed   Daily Caffeine Use-2 cups daily or more   Pt does not get regular exercise   Works [employer], 3rd shift  Current Outpatient Prescriptions on File Prior to Visit  Medication Sig Dispense Refill  . Blood Glucose Monitoring Suppl (TRUE METRIX AIR GLUCOSE METER) W/DEVICE KIT 1 Device by Other route once. 1 kit 0  . calcium carbonate (OS-CAL) 600 MG TABS Take 1,200 mg by mouth daily.     Marland Kitchen glucose blood (ACCU-CHEK SMARTVIEW) test strip Use to check blood sugar 2 times per day. And Lancets 2/day Dx Code: E11.9 200 each 1  . lisinopril (PRINIVIL,ZESTRIL) 10 MG tablet TAKE 1 TABLET EVERY DAY (Patient taking differently: TAKE 10 MG BY MOUTH EVERY DAY) 90 tablet 3  . MONOJECT ULTRA COMFORT SYRINGE 29G X 1/2" 0.5 ML MISC USE AS DIRECTED 300 each 3  . omeprazole  (PRILOSEC) 40 MG capsule Take 1 capsule (40 mg total) by mouth 2 (two) times daily. 180 capsule 3  . ranitidine (ZANTAC) 300 MG tablet Take 1 tablet (300 mg total) by mouth at bedtime. 90 tablet 3  . simvastatin (ZOCOR) 80 MG tablet TAKE 1/2 TABLET AT BEDTIME 45 tablet 3  . venlafaxine XR (EFFEXOR-XR) 75 MG 24 hr capsule TAKE 1 CAPSULE (75 MG TOTAL) DAILY WITH BREAKFAST. 90 capsule 1  . vitamin D, CHOLECALCIFEROL, 400 UNITS tablet Take 400 Units by mouth daily.    . vitamin E (VITAMIN E) 400 UNIT capsule Take 400 Units by mouth daily.     No current facility-administered medications on file prior to visit.    Allergies  Allergen Reactions  . Procaine Other (See Comments)    Heart race  . Adhesive [Tape] Rash and Other (See Comments)    SKIN IRRITATION    Family History  Problem Relation Age of Onset  . Ovarian cancer Mother   . Stroke Father   . Diabetes Sister   . Stroke Sister   . Heart disease Sister     before age 44  . Diabetes Brother   . Heart disease Brother     before age 19  . Diabetes Sister     BP 136/80 mmHg  Pulse 92  Ht 5' 1"  (1.549 m)  Wt 176 lb (79.833 kg)  BMI 33.27 kg/m2  SpO2 92%    Review of Systems Denies LOC    Objective:   Physical Exam VITAL SIGNS:  See vs page GENERAL: no distress Pulses: dorsalis pedis intact bilat.   MSK: no deformity of the feet CV: no leg edema Skin:  The ulcer is healed.  normal color and temp on the feet. Neuro: sensation is intact to touch on the feet   A1c=7.9%    Assessment & Plan:  DM: this is the best control this pt should aim for, given this regimen, which does match insulin to her changing needs throughout the day.  therapy limited by noncompliance with insulin dosing.  i'll do the best i can.  Patient is advised the following: Patient Instructions  please take the 2 different injections (but both before breakfast), 30 units of N, and 70 units of R.   It is important to take these numbers, no  matter what your blood sugar is.   Please come back for a "medicare wellness" appointment in 4 months.   check your blood sugar twice a day.  vary the time of day when you check, between before the 3 meals, and at bedtime.  also check if you have symptoms of your blood sugar being too high or too low.  please keep a record of the readings and bring it to your  next appointment here.  You can write it on any piece of paper.  please call us sooner if your blood sugar goes below 70, or if you have a lot of readings over 200.

## 2015-06-12 NOTE — Patient Instructions (Addendum)
please take the 2 different injections (but both before breakfast), 30 units of N, and 70 units of R.   It is important to take these numbers, no matter what your blood sugar is.   Please come back for a "medicare wellness" appointment in 4 months.   check your blood sugar twice a day.  vary the time of day when you check, between before the 3 meals, and at bedtime.  also check if you have symptoms of your blood sugar being too high or too low.  please keep a record of the readings and bring it to your next appointment here.  You can write it on any piece of paper.  please call us sooner if your blood sugar goes below 70, or if you have a lot of readings over 200.

## 2015-07-23 ENCOUNTER — Other Ambulatory Visit: Payer: Self-pay | Admitting: Endocrinology

## 2015-08-14 ENCOUNTER — Other Ambulatory Visit: Payer: Self-pay | Admitting: Gastroenterology

## 2015-08-15 ENCOUNTER — Other Ambulatory Visit: Payer: Self-pay | Admitting: Endocrinology

## 2015-08-28 ENCOUNTER — Telehealth: Payer: Self-pay | Admitting: Gastroenterology

## 2015-08-28 ENCOUNTER — Telehealth: Payer: Self-pay | Admitting: Endocrinology

## 2015-08-28 DIAGNOSIS — K219 Gastro-esophageal reflux disease without esophagitis: Secondary | ICD-10-CM

## 2015-08-28 DIAGNOSIS — K2270 Barrett's esophagus without dysplasia: Secondary | ICD-10-CM

## 2015-08-28 MED ORDER — OMEPRAZOLE 40 MG PO CPDR: 40.0000 mg | DELAYED_RELEASE_CAPSULE | Freq: Two times a day (BID) | ORAL | 0 refills | Status: DC

## 2015-08-28 MED ORDER — RANITIDINE HCL 300 MG PO TABS: 300.0000 mg | ORAL_TABLET | Freq: Every day | ORAL | 0 refills | Status: DC

## 2015-08-28 NOTE — Telephone Encounter (Signed)
Patient need a refill to see Dr Silvio Pate for stomachs problems.

## 2015-08-28 NOTE — Telephone Encounter (Signed)
Prescriptions sent to patient's mail order pharmacy and patient notified to keep appt for any further refills. Patient verbalized understanding.

## 2015-08-29 NOTE — Telephone Encounter (Signed)
See below. Pt is requesting a referral to be placed to see Dr. Fuller Plan for GI issues. Would you be able to place this referral during Dr. Cordelia Pen absence? Thanks!

## 2015-08-29 NOTE — Telephone Encounter (Signed)
I contacted the pt and advised she has an appointment on 10/23/2015 with Dr. Fuller Plan. Pt stated she was not aware of the appt and did not need anything further at this time.

## 2015-08-29 NOTE — Telephone Encounter (Signed)
She already has an appointment.  Does she still needed a referral and what is it for

## 2015-10-15 ENCOUNTER — Other Ambulatory Visit (INDEPENDENT_AMBULATORY_CARE_PROVIDER_SITE_OTHER): Payer: Commercial Managed Care - HMO

## 2015-10-15 ENCOUNTER — Ambulatory Visit (INDEPENDENT_AMBULATORY_CARE_PROVIDER_SITE_OTHER): Payer: Commercial Managed Care - HMO | Admitting: Endocrinology

## 2015-10-15 VITALS — BP 104/62 | HR 104 | Ht 61.0 in | Wt 175.0 lb

## 2015-10-15 DIAGNOSIS — M810 Age-related osteoporosis without current pathological fracture: Secondary | ICD-10-CM

## 2015-10-15 DIAGNOSIS — L0889 Other specified local infections of the skin and subcutaneous tissue: Secondary | ICD-10-CM

## 2015-10-15 DIAGNOSIS — E1151 Type 2 diabetes mellitus with diabetic peripheral angiopathy without gangrene: Secondary | ICD-10-CM

## 2015-10-15 DIAGNOSIS — E1121 Type 2 diabetes mellitus with diabetic nephropathy: Secondary | ICD-10-CM | POA: Diagnosis not present

## 2015-10-15 DIAGNOSIS — D509 Iron deficiency anemia, unspecified: Secondary | ICD-10-CM

## 2015-10-15 DIAGNOSIS — Z23 Encounter for immunization: Secondary | ICD-10-CM | POA: Diagnosis not present

## 2015-10-15 DIAGNOSIS — Z0000 Encounter for general adult medical examination without abnormal findings: Secondary | ICD-10-CM

## 2015-10-15 DIAGNOSIS — I739 Peripheral vascular disease, unspecified: Secondary | ICD-10-CM

## 2015-10-15 DIAGNOSIS — Z794 Long term (current) use of insulin: Secondary | ICD-10-CM | POA: Diagnosis not present

## 2015-10-15 DIAGNOSIS — E119 Type 2 diabetes mellitus without complications: Secondary | ICD-10-CM | POA: Insufficient documentation

## 2015-10-15 DIAGNOSIS — L089 Local infection of the skin and subcutaneous tissue, unspecified: Secondary | ICD-10-CM

## 2015-10-15 LAB — CBC WITH DIFFERENTIAL/PLATELET
Basophils Absolute: 0 10*3/uL (ref 0.0–0.1)
Basophils Relative: 0.3 % (ref 0.0–3.0)
EOS PCT: 0.8 % (ref 0.0–5.0)
Eosinophils Absolute: 0.1 10*3/uL (ref 0.0–0.7)
HCT: 39.1 % (ref 36.0–46.0)
HEMOGLOBIN: 13.1 g/dL (ref 12.0–15.0)
Lymphocytes Relative: 20.6 % (ref 12.0–46.0)
Lymphs Abs: 2.3 10*3/uL (ref 0.7–4.0)
MCHC: 33.4 g/dL (ref 30.0–36.0)
MCV: 85.3 fl (ref 78.0–100.0)
MONO ABS: 0.7 10*3/uL (ref 0.1–1.0)
MONOS PCT: 6 % (ref 3.0–12.0)
Neutro Abs: 8.2 10*3/uL — ABNORMAL HIGH (ref 1.4–7.7)
Neutrophils Relative %: 72.3 % (ref 43.0–77.0)
Platelets: 381 10*3/uL (ref 150.0–400.0)
RBC: 4.58 Mil/uL (ref 3.87–5.11)
RDW: 14.4 % (ref 11.5–15.5)
WBC: 11.4 10*3/uL — AB (ref 4.0–10.5)

## 2015-10-15 LAB — HEPATIC FUNCTION PANEL
ALT: 13 U/L (ref 0–35)
AST: 12 U/L (ref 0–37)
Albumin: 3.8 g/dL (ref 3.5–5.2)
Alkaline Phosphatase: 101 U/L (ref 39–117)
BILIRUBIN DIRECT: 0.1 mg/dL (ref 0.0–0.3)
BILIRUBIN TOTAL: 0.4 mg/dL (ref 0.2–1.2)
Total Protein: 7.2 g/dL (ref 6.0–8.3)

## 2015-10-15 LAB — URINALYSIS, ROUTINE W REFLEX MICROSCOPIC
BILIRUBIN URINE: NEGATIVE
HGB URINE DIPSTICK: NEGATIVE
KETONES UR: NEGATIVE
LEUKOCYTES UA: NEGATIVE
NITRITE: NEGATIVE
RBC / HPF: NONE SEEN (ref 0–?)
Specific Gravity, Urine: 1.02 (ref 1.000–1.030)
Total Protein, Urine: 100 — AB
URINE GLUCOSE: NEGATIVE
Urobilinogen, UA: 0.2 (ref 0.0–1.0)
pH: 6 (ref 5.0–8.0)

## 2015-10-15 LAB — BASIC METABOLIC PANEL
BUN: 20 mg/dL (ref 6–23)
CO2: 29 mEq/L (ref 19–32)
Calcium: 8.8 mg/dL (ref 8.4–10.5)
Chloride: 103 mEq/L (ref 96–112)
Creatinine, Ser: 1.03 mg/dL (ref 0.40–1.20)
GFR: 56.56 mL/min — AB (ref >60.00)
Glucose, Bld: 224 mg/dL — ABNORMAL HIGH (ref 70–99)
POTASSIUM: 4.7 meq/L (ref 3.5–5.1)
SODIUM: 140 meq/L (ref 135–145)

## 2015-10-15 LAB — IBC PANEL
Iron: 148 ug/dL — ABNORMAL HIGH (ref 42–145)
SATURATION RATIOS: 30.3 % (ref 20.0–50.0)
Transferrin: 349 mg/dL (ref 212.0–360.0)

## 2015-10-15 LAB — LIPID PANEL
CHOLESTEROL: 171 mg/dL (ref 0–200)
HDL: 43.1 mg/dL (ref >39.00)
LDL Cholesterol: 92 mg/dL (ref 0–99)
NonHDL: 128
Total CHOL/HDL Ratio: 4
Triglycerides: 179 mg/dL — ABNORMAL HIGH (ref 0.0–149.0)
VLDL: 35.8 mg/dL (ref 0.0–40.0)

## 2015-10-15 LAB — MICROALBUMIN / CREATININE URINE RATIO
CREATININE, U: 164.2 mg/dL
MICROALB UR: 69.5 mg/dL — AB (ref 0.0–1.9)
MICROALB/CREAT RATIO: 42.3 mg/g — AB (ref 0.0–30.0)

## 2015-10-15 LAB — TSH: TSH: 2.14 u[IU]/mL (ref 0.35–4.50)

## 2015-10-15 MED ORDER — DOXYCYCLINE HYCLATE 100 MG PO TABS: 100.0000 mg | ORAL_TABLET | Freq: Two times a day (BID) | ORAL | 0 refills | Status: DC

## 2015-10-15 NOTE — Progress Notes (Signed)
Subjective:    Patient ID: Yvonne Reynolds, female    DOB: [birth year], 70 y.o.   MRN: [id number]  HPI Pt is here for regular wellness examination, and is feeling pretty well in general, and says chronic med probs are stable, except as noted below Past Medical History:  Diagnosis Date  . Complication of anesthesia    states was hard to wake up after 1st EGD  . Depression   . GERD (gastroesophageal reflux disease)   . High cholesterol   . History of seizure    x 1 - due to hyperglycemia  . Hypertension    states under control with med., has been on med. > 20 yr.  . Insulin dependent diabetes mellitus ([place])   . Macular degeneration   . Nocturia   . Osteoarthritis    shoulder  . Osteoporosis   . Popping of temporomandibular joint on opening of jaw   . Sebaceous cyst 09/2014   chest wall - sternum  . Toenail fungus     Past Surgical History:  Procedure Laterality Date  . ABDOMINAL HYSTERECTOMY     partial  . BLADDER SUSPENSION    . CATARACT EXTRACTION W/ INTRAOCULAR LENS  IMPLANT, BILATERAL Bilateral   . CESAREAN SECTION    . ESOPHAGOGASTRODUODENOSCOPY  05/07/2004  . ESOPHAGOGASTRODUODENOSCOPY (EGD) WITH PROPOFOL  07/06/2013  . LOWER EXTREMITY ANGIOGRAM Bilateral 04/12/2015   Procedure: Lower Extremity Angiogram;  Surgeon: Conrad Brisbin, MD;  Location: [facility];  Service: Cardiovascular;  Laterality: Bilateral;  . MASS EXCISION N/A 10/17/2014   Procedure: EXCISION 2X3CM SUBCUTANEOUS CHEST WALL CYST;  Surgeon: Rolm Bookbinder, MD;  Location: [facility];  Service: General;  Laterality: N/A;  . ORIF ANKLE FRACTURE Left 07/23/2012   Procedure: OPEN REDUCTION INTERNAL FIXATION (ORIF) ANKLE FRACTURE;  Surgeon: Jessy Oto, MD;  Location: [facility];  Service: Orthopedics;  Laterality: Left;  . ORIF WRIST FRACTURE Left   . PERIPHERAL VASCULAR CATHETERIZATION N/A 04/12/2015   Procedure: Abdominal Aortogram;  Surgeon: Conrad Westfield, MD;  Location: [facility];  Service: Cardiovascular;  Laterality: N/A;  . PERIPHERAL VASCULAR CATHETERIZATION Right 04/12/2015   Procedure: Peripheral Vascular Balloon Angioplasty;  Surgeon: Conrad Deer Park, MD;  Location: [facility];  Service: Cardiovascular;  Laterality: Right;  rt sfa drug coated balloon    Social History   Social History  . Marital status: Widowed    Spouse name: N/A  . Number of children: 3  . Years of education: N/A   Occupational History  . 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 Smoking status: Former Smoker    Packs/day: 0.00    Quit date: 11/11/2011  . Smokeless tobacco: Never Used  . Alcohol use No  . Drug use: No  . Sexual activity: Not on file   Other Topics Concern  . Not on file   Social History Narrative   Widowed   Daily Caffeine Use-2 cups daily or more   Pt does not get regular exercise   Works [employer], 3rd shift    Current Outpatient Prescriptions on File Prior to Visit  Medication Sig Dispense Refill  . Blood Glucose Monitoring Suppl (TRUE METRIX AIR GLUCOSE METER) W/DEVICE KIT 1 Device by Other route once. 1 kit 0  . calcium carbonate (OS-CAL) 600 MG TABS Take 1,200 mg by mouth daily.     . insulin NPH Human (NOVOLIN  N) 100 UNIT/ML injection Inject 0.3 mLs (30 Units total) into the skin every morning. 10 mL 11  . insulin regular (NOVOLIN R) 100 units/mL injection Inject 0.7 mLs (70 Units total) into the skin daily with breakfast. 30 mL 11  . lisinopril (PRINIVIL,ZESTRIL) 10 MG tablet TAKE 1 TABLET EVERY DAY 90 tablet 1  . MONOJECT ULTRA COMFORT SYRINGE 29G X 1/2" 0.5 ML MISC USE AS DIRECTED 300 each 3  . omeprazole (PRILOSEC) 40 MG capsule Take 1 capsule (40 mg total) by mouth 2 (two) times daily. 180 capsule 0  . ranitidine (ZANTAC) 300 MG tablet Take 1 tablet (300 mg total) by mouth at bedtime. 90 tablet 0  . simvastatin (ZOCOR) 80 MG tablet TAKE 1/2 TABLET AT BEDTIME 45 tablet 3  . TRUE METRIX  BLOOD GLUCOSE TEST test strip TEST TWO TIMES DAILY 200 each 3  . TRUEPLUS LANCETS 28G MISC TEST TWO TIMES DAILY 200 each 3  . venlafaxine XR (EFFEXOR-XR) 75 MG 24 hr capsule TAKE 1 CAPSULE (75 MG TOTAL) DAILY WITH BREAKFAST. 90 capsule 1  . vitamin D, CHOLECALCIFEROL, 400 UNITS tablet Take 400 Units by mouth daily.    . vitamin E (VITAMIN E) 400 UNIT capsule Take 400 Units by mouth daily.     No current facility-administered medications on file prior to visit.     Allergies  Allergen Reactions  . Procaine Other (See Comments)    Heart race  . Adhesive [Tape] Rash and Other (See Comments)    SKIN IRRITATION    Family History  Problem Relation Age of Onset  . Ovarian cancer Mother   . Stroke Father   . Diabetes Sister   . Stroke Sister   . Heart disease Sister     before age 36  . Diabetes Brother   . Heart disease Brother     before age 53  . Diabetes Sister     BP 104/62   Pulse (!) 104   Ht 5' 1"  (1.549 m)   Wt 175 lb (79.4 kg)   SpO2 95%   BMI 33.07 kg/m   Review of Systems  Constitutional: Negative for fever.  HENT: Negative for trouble swallowing.   Eyes: Negative for itching.  Respiratory: Negative for shortness of breath.   Cardiovascular: Negative for chest pain.  Gastrointestinal: Negative for blood in stool.  Endocrine: Negative for cold intolerance.  Genitourinary: Negative for hematuria.  Musculoskeletal: Positive for back pain.  Skin: Negative for rash.  Allergic/Immunologic: Positive for environmental allergies.  Neurological: Negative for headaches.  Hematological: Bruises/bleeds easily.  Psychiatric/Behavioral: The patient is not nervous/anxious.        Objective:   Physical Exam VS: see vs page GEN: no distress HEAD: head: no deformity eyes: no periorbital swelling, no proptosis external nose and ears are normal mouth: no lesion seen NECK: supple, thyroid is not enlarged CHEST WALL: no deformity LUNGS: clear to auscultation CV: reg  rate and rhythm, no murmur ABD: abdomen is soft, nontender.  no hepatosplenomegaly.  not distended.  no hernia MUSCULOSKELETAL: muscle bulk and strength are grossly normal.  no obvious joint swelling.  gait is normal and steady EXTEMITIES: no deformity.  no ulcer on the feet.  feet are of normal color and temp.  no edema PULSES: dorsalis pedis intact bilat.  no carotid bruit NEURO:  cn 2-12 grossly intact.   readily moves all 4's.  sensation is intact to touch on the feet SKIN:  Normal texture and temperature.  No rash  or suspicious lesion is visible.   NODES:  None palpable at the neck PSYCH: alert, well-oriented.  Does not appear anxious nor depressed.         Assessment & Plan:  Wellness visit today, with problems stable, except as noted.   SEPARATE EVALUATION FOLLOWS--EACH PROBLEM HERE IS NEW, NOT RESPONDING TO TREATMENT, OR POSES SIGNIFICANT RISK TO THE PATIENT'S HEALTH: HISTORY OF THE PRESENT ILLNESS: Pt returns for f/u of diabetes mellitus: DM type: Insulin-requiring type 2 Dx'ed: 3419 Complications: retinopathy and nephropathy.  Therapy: insulin since 1999 GDM: never DKA: never Severe hypoglycemia: never Pancreatitis: never.  Other: she chose a simple and inexpensive insulin schedule.   Interval history:She had an episode of severe hypoglycemia a few weeks ago.  This happened at approx 4 PM, after a missed meal.  She has 1 week of slight pain at both ears, but no assoc itching.   PAST MEDICAL HISTORY reviewed and up to date today REVIEW OF SYSTEMS: No weight change.  Denies numbness.   PHYSICAL EXAMINATION: VITAL SIGNS:  See vs page GENERAL: no distress Ext ears: few bilat pustules, each a few mm diameter.  LAB/XRAY RESULTS: A1c=8.6% IMPRESSION: Pustules, new DM: control is not optimal.  However, we cannot increase insulin now, due to severe hypoglycemia.   Subjective:   Patient here for Medicare annual wellness visit and management of other chronic and acute  problems.     Risk factors: advanced age    30 of Physicians Providing Medical Care to Patient:  See "snapshot"   Activities of Daily Living: In your present state of health, do you have any difficulty performing the following activities?:  Preparing food and eating?: No  Bathing yourself: No  Getting dressed: No  Using the toilet:No  Moving around from place to place: No  In the past year have you fallen or had a near fall?: No    Home Safety: Has smoke detector and wears seat belts. No firearms. No excess sun exposure.   Diet and Exercise  Current exercise habits: pt says poor Dietary issues discussed: pt reports a healthy diet   Depression Screen  Q1: Over the past two weeks, have you felt down, depressed or hopeless? no  Q2: Over the past two weeks, have you felt little interest or pleasure in doing things? no   The following portions of the patient's history were reviewed and updated as appropriate: allergies, current medications, past family history, past medical history, past social history, past surgical history and problem list.   Review of Systems  Denies hearing loss, and visual loss.  Objective:   Vision:  Advertising account executive, so she declines VA today.   Hearing: grossly normal Body mass index:  See vs page Msk: pt easily and quickly performs "get-up-and-go" from a sitting position.   Cognitive Impairment Assessment: cognition, memory and judgment appear normal.  remembers 3/3 at 5 minutes.  excellent recall.  can easily read and write a sentence.  alert and oriented x 3.   Assessment:   Medicare wellness utd on preventive parameters    Plan:   During the course of the visit the patient was educated and counseled about appropriate screening and preventive services including:       Fall prevention   Screening mammography  Bone densitometry screening  Diabetes screening  Nutrition counseling   Vaccines / LABS Zostavax / Pneumococcal Vaccine today    Patient Instructions (the written plan) was given to the patient.

## 2015-10-15 NOTE — Progress Notes (Signed)
we discussed code status.  pt requests full code, but would not want to be started or maintained on artificial life-support measures if there was not a reasonable chance of recovery 

## 2015-10-15 NOTE — Patient Instructions (Addendum)
On this type of insulin schedule, you should eat meals on a regular schedule.  If a meal is missed or significantly delayed, your blood sugar could go low again.  Please continue the same insulins for now.   I have sent a prescription to your pharmacy, for an antibiotic pill.   good diet and exercise significantly improve the control of your diabetes.  please let me know if you wish to be referred to a dietician.  high blood sugar is very risky to your health.  you should see an eye doctor and dentist every year.  It is very important to get all recommended vaccinations.  Please consider these measures for your health:  minimize alcohol.  Do not use tobacco products.  Have a colonoscopy at least every 10 years from age 81.  Women should have an annual mammogram from age 19.  Keep firearms safely stored.  Always use seat belts.  have working smoke alarms in your home.  See an eye doctor and dentist regularly.  Never drive under the influence of alcohol or drugs (including prescription drugs).  Those with fair skin should take precautions against the sun, and should carefully examine their skin once per month, for any new or changed moles. It is critically important to prevent falling down (keep floor areas well-lit, dry, and free of loose objects.  If you have a cane, walker, or wheelchair, you should use it, even for short trips around the house.  Wear flat-soled shoes.  Also, try not to rush) [facility] at [phone], to make an appointment for a mammogram. blood tests are requested for you today.  We'll let you know about the results.  check your blood sugar twice a day.  vary the time of day when you check, between before the 3 meals, and at bedtime.  also check if you have symptoms of your blood sugar being too high or too low.  please keep a record of the readings and bring it to your next appointment here.  You can write it on any piece of paper.  please call us sooner if your blood  sugar goes below 70, or if you have a lot of readings over 200.   Please come back for a follow-up appointment in 2 months.

## 2015-10-16 DIAGNOSIS — L089 Local infection of the skin and subcutaneous tissue, unspecified: Secondary | ICD-10-CM | POA: Insufficient documentation

## 2015-10-17 LAB — PTH, INTACT AND CALCIUM
Calcium: 8.7 mg/dL (ref 8.6–10.4)
PTH: 40 pg/mL (ref 14–64)

## 2015-10-20 ENCOUNTER — Other Ambulatory Visit: Payer: Self-pay | Admitting: Endocrinology

## 2015-10-23 ENCOUNTER — Ambulatory Visit (INDEPENDENT_AMBULATORY_CARE_PROVIDER_SITE_OTHER): Payer: Commercial Managed Care - HMO | Admitting: Gastroenterology

## 2015-10-23 ENCOUNTER — Encounter: Payer: Self-pay | Admitting: Gastroenterology

## 2015-10-23 VITALS — BP 132/76 | HR 82 | Ht 61.0 in | Wt 178.4 lb

## 2015-10-23 DIAGNOSIS — R079 Chest pain, unspecified: Secondary | ICD-10-CM

## 2015-10-23 DIAGNOSIS — Z1211 Encounter for screening for malignant neoplasm of colon: Secondary | ICD-10-CM | POA: Diagnosis not present

## 2015-10-23 DIAGNOSIS — K219 Gastro-esophageal reflux disease without esophagitis: Secondary | ICD-10-CM

## 2015-10-23 DIAGNOSIS — K2270 Barrett's esophagus without dysplasia: Secondary | ICD-10-CM | POA: Diagnosis not present

## 2015-10-23 DIAGNOSIS — Z1212 Encounter for screening for malignant neoplasm of rectum: Secondary | ICD-10-CM

## 2015-10-23 MED ORDER — OMEPRAZOLE 40 MG PO CPDR: 40.0000 mg | DELAYED_RELEASE_CAPSULE | Freq: Two times a day (BID) | ORAL | 3 refills | Status: DC

## 2015-10-23 MED ORDER — RANITIDINE HCL 300 MG PO TABS: 300.0000 mg | ORAL_TABLET | Freq: Every day | ORAL | 3 refills | Status: DC

## 2015-10-23 NOTE — Progress Notes (Signed)
    History of Present Illness: This is a 70 year old female returning for follow-up of erosive esophagitis and Barrett's esophagus. Most recent EGD as below. Patient is maintained on twice a day PPI and ranitidine at bedtime. She occasionally has nocturnal symptoms despite this regimen and despite head of bed elevation. She has occasional episodes of midsternal chest pain radiating straight through to her back that last about 1 minute of the time they do not have any typical symptoms of her reflux. These symptoms are not associated with meals or any particular activity. The patient notes about an 18 pound weight gain over the past year. Blood work from September 18 showed an unremarkable CBC, LFTs and iron studies.  EGD 06/2013 ENDOSCOPIC IMPRESSION : 1. LA Class C esophagitis 2. Barrett's esophagus; multiple biopsies 3. Moderate hiatal hernia  Current Medications, Allergies, Past Medical History, Past Surgical History, Family History and Social History were reviewed in [employer] record.  Physical Exam: General: Well developed, well nourished, no acute distress Head: Normocephalic and atraumatic Eyes:  sclerae anicteric, EOMI Ears: Normal auditory acuity Mouth: No deformity or lesions Lungs: Clear throughout to auscultation Heart: Regular rate and rhythm; no murmurs, rubs or bruits Abdomen: Soft, non tender and non distended. No masses, hepatosplenomegaly or hernias noted. Normal Bowel sounds Musculoskeletal: Symmetrical with no gross deformities  Pulses:  Normal pulses noted Extremities: No clubbing, cyanosis, edema or deformities noted Neurological: Alert oriented x 4, grossly nonfocal Psychological:  Alert and cooperative. Normal mood and affect  Assessment and Recommendations:  1. Barrett's esophagus without dysplasia. LA class C erosive esophagitis. Reflux symptoms are under good control. Continue omeprazole 40 mg twice daily and ranitidine 300 mg at bedtime.  Continue standard antireflux measures and continue head of bed elevation. Surveillance endoscopy recommended in 06/2016.  2. Intermittent brief midsternal chest pains radiating to her back. This does not appear to be of gastrointestinal origin. I advised further evaluation by her PCP Dr. Loanne Drilling. Consider abd [place] to evaluate for cholelithiasis if no other cause of chest pain is found.  3. Colorectal cancer screening average risk. Epic records indicate that she had a colonoscopy around 2002 however no report is available and the patient does not recall having colonoscopy. I advised her to schedule colonoscopy today however she declines stating it's difficult for her to get a care partner. She states she may be willing to do colonoscopy at the time of her next EGD. We'll therefore place a colon recall for the time for next EGD in 06/2016.

## 2015-10-23 NOTE — Patient Instructions (Signed)
We have sent the following medications to your pharmacy for you to pick up at your convenience: Omeprazole 40 mg twice daily  Ranitidine 300 mg every night   You will be due for a recall endoscopy/colonoscopy in 06/2016. We will send you a reminder in the mail when it gets closer to that time.  If you are age 68 or older, your body mass index should be between 23-30. Your Body mass index is 33.7 kg/m. If this is out of the aforementioned range listed, please consider follow up with your Primary Care Provider.  If you are age 69 or younger, your body mass index should be between 19-25. Your Body mass index is 33.7 kg/m. If this is out of the aformentioned range listed, please consider follow up with your Primary Care Provider.

## 2015-11-12 ENCOUNTER — Telehealth: Payer: Self-pay | Admitting: Endocrinology

## 2015-11-12 NOTE — Telephone Encounter (Signed)
See message and please advise, Thanks!  

## 2015-11-12 NOTE — Telephone Encounter (Signed)
Patient ask if Loanne Drilling would referrer her to Dr Lorrene Reid [facility], [place], [place] [address]  Phone: [phone]

## 2015-11-12 NOTE — Telephone Encounter (Signed)
I need to know the reason 

## 2015-11-13 NOTE — Telephone Encounter (Signed)
I contacted the patient and advise of message. Patient has decided she would like to see Dr. Loanne Drilling before advising what the referral was for. Patient is coming in tomorrow at 3.15 to discuss the referral she needs.

## 2015-11-14 ENCOUNTER — Ambulatory Visit (INDEPENDENT_AMBULATORY_CARE_PROVIDER_SITE_OTHER): Payer: Commercial Managed Care - HMO | Admitting: Endocrinology

## 2015-11-14 ENCOUNTER — Encounter: Payer: Self-pay | Admitting: Endocrinology

## 2015-11-14 VITALS — BP 160/72 | HR 99 | Ht 61.0 in | Wt 178.0 lb

## 2015-11-14 DIAGNOSIS — Z794 Long term (current) use of insulin: Secondary | ICD-10-CM

## 2015-11-14 DIAGNOSIS — E1151 Type 2 diabetes mellitus with diabetic peripheral angiopathy without gangrene: Secondary | ICD-10-CM | POA: Diagnosis not present

## 2015-11-14 NOTE — Patient Instructions (Addendum)
On this type of insulin schedule, you should eat meals on a regular schedule.  If a meal is missed or significantly delayed, your blood sugar could go low again.   Please continue the same insulins for now.   A diabetes blood test isrequested for you today.  We'll let you know about the results.  Please continue the same medication for blood pressure, for now.  Please see an eye specialist.  you will receive a phone call, about a day and time for an appointment check your blood sugar twice a day.  vary the time of day when you check, between before the 3 meals, and at bedtime.  also check if you have symptoms of your blood sugar being too high or too low.  please keep a record of the readings and bring it to your next appointment here.  You can write it on any piece of paper.  please call us sooner if your blood sugar goes below 70, or if you have a lot of readings over 200.   Please come back for a follow-up appointment in 1 month.

## 2015-11-14 NOTE — Progress Notes (Signed)
Subjective:    Patient ID: Yvonne Reynolds, female    DOB: 23-Jun-1945, 70 y.o.   MRN: [id number]  HPI Pt returns for f/u of diabetes mellitus: DM type: Insulin-requiring type 2 Dx'ed: 9767 Complications: retinopathy and nephropathy.  Therapy: insulin since 1999 GDM: never DKA: never Severe hypoglycemia: once, in mid-2017.   Pancreatitis: never.  Other: she chose a simple and inexpensive insulin schedule.  She takes R and N, both QAM.   Interval history: no cbg record, but states cbg's vary from 40-200's.  It is lowest at HS, and highest fasting.  She says she never misses the insulin.   BP was normal last time.   Past Medical History:  Diagnosis Date  . Complication of anesthesia    states was hard to wake up after 1st EGD  . Depression   . GERD (gastroesophageal reflux disease)   . High cholesterol   . History of seizure    x 1 - due to hyperglycemia  . Hypertension    states under control with med., has been on med. > 20 yr.  . Insulin dependent diabetes mellitus ([place])   . Macular degeneration   . Nocturia   . Osteoarthritis    shoulder  . Osteoporosis   . Popping of temporomandibular joint on opening of jaw   . Sebaceous cyst 09/2014   chest wall - sternum  . Toenail fungus     Past Surgical History:  Procedure Laterality Date  . ABDOMINAL HYSTERECTOMY     partial  . BLADDER SUSPENSION    . CATARACT EXTRACTION W/ INTRAOCULAR LENS  IMPLANT, BILATERAL Bilateral   . CESAREAN SECTION    . ESOPHAGOGASTRODUODENOSCOPY  05/07/2004  . ESOPHAGOGASTRODUODENOSCOPY (EGD) WITH PROPOFOL  07/06/2013  . LOWER EXTREMITY ANGIOGRAM Bilateral 04/12/2015   Procedure: Lower Extremity Angiogram;  Surgeon: Conrad Campbell, MD;  Location: [facility];  Service: Cardiovascular;  Laterality: Bilateral;  . MASS EXCISION N/A 10/17/2014   Procedure: EXCISION 2X3CM SUBCUTANEOUS CHEST WALL CYST;  Surgeon: Rolm Bookbinder, MD;  Location: [facility];  Service: General;   Laterality: N/A;  . ORIF ANKLE FRACTURE Left 07/23/2012   Procedure: OPEN REDUCTION INTERNAL FIXATION (ORIF) ANKLE FRACTURE;  Surgeon: Jessy Oto, MD;  Location: [facility];  Service: Orthopedics;  Laterality: Left;  . ORIF WRIST FRACTURE Left   . PERIPHERAL VASCULAR CATHETERIZATION N/A 04/12/2015   Procedure: Abdominal Aortogram;  Surgeon: Conrad Rackerby, MD;  Location: [facility];  Service: Cardiovascular;  Laterality: N/A;  . PERIPHERAL VASCULAR CATHETERIZATION Right 04/12/2015   Procedure: Peripheral Vascular Balloon Angioplasty;  Surgeon: Conrad , MD;  Location: [facility];  Service: Cardiovascular;  Laterality: Right;  rt sfa drug coated balloon    Social History   Social History  . Marital status: Widowed    Spouse name: N/A  . Number of children: 3  . Years of education: N/A   Occupational History  . 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 Smoking status: Former Smoker    Packs/day: 0.00    Quit date: 11/11/2011  . Smokeless tobacco: Never Used  . Alcohol use No  . Drug use: No  . Sexual activity: Not on file   Other Topics Concern  . Not on file   Social History Narrative   Widowed   Daily Caffeine Use-2 cups daily or more   Pt does not get regular exercise  Works [employer], 3rd shift    Current Outpatient Prescriptions on File Prior to Visit  Medication Sig Dispense Refill  . Blood Glucose Monitoring Suppl (TRUE METRIX AIR GLUCOSE METER) W/DEVICE KIT 1 Device by Other route once. 1 kit 0  . calcium carbonate (OS-CAL) 600 MG TABS Take 1,200 mg by mouth daily.     . insulin NPH Human (NOVOLIN N) 100 UNIT/ML injection Inject 0.3 mLs (30 Units total) into the skin every morning. 10 mL 11  . insulin regular (NOVOLIN R) 100 units/mL injection Inject 0.7 mLs (70 Units total) into the skin daily with breakfast. 30 mL 11  . lisinopril (PRINIVIL,ZESTRIL) 10 MG tablet TAKE 1 TABLET EVERY DAY 90 tablet 1    . MONOJECT ULTRA COMFORT SYRINGE 29G X 1/2" 0.5 ML MISC USE AS DIRECTED 300 each 3  . omeprazole (PRILOSEC) 40 MG capsule Take 1 capsule (40 mg total) by mouth 2 (two) times daily. 180 capsule 3  . ranitidine (ZANTAC) 300 MG tablet Take 1 tablet (300 mg total) by mouth at bedtime. 90 tablet 3  . simvastatin (ZOCOR) 80 MG tablet TAKE 1/2 TABLET AT BEDTIME 45 tablet 3  . TRUE METRIX BLOOD GLUCOSE TEST test strip TEST TWO TIMES DAILY 200 each 3  . TRUEPLUS LANCETS 28G MISC TEST TWO TIMES DAILY 200 each 3  . venlafaxine XR (EFFEXOR-XR) 75 MG 24 hr capsule TAKE 1 CAPSULE DAILY WITH BREAKFAST. 90 capsule 1  . vitamin D, CHOLECALCIFEROL, 400 UNITS tablet Take 400 Units by mouth daily.    . vitamin E (VITAMIN E) 400 UNIT capsule Take 400 Units by mouth daily.     No current facility-administered medications on file prior to visit.     Allergies  Allergen Reactions  . Procaine Other (See Comments)    Heart race  . Adhesive [Tape] Rash and Other (See Comments)    SKIN IRRITATION    Family History  Problem Relation Age of Onset  . Ovarian cancer Mother   . Stroke Father   . Diabetes Sister   . Stroke Sister   . Heart disease Sister     before age 15  . Diabetes Brother   . Heart disease Brother     before age 15  . Diabetes Sister     BP (!) 160/72   Pulse 99   Ht 5' 1"  (1.549 m)   Wt 178 lb (80.7 kg)   SpO2 96%   BMI 33.63 kg/m    Review of Systems Denies LOC.  She has a "sensation" from the left eye for a few days.  No visual loss.  She says she stopped seeing opthal, due to too many dr appointments.     Objective:   Physical Exam VITAL SIGNS:  See vs page GENERAL: no distress Pulses: dorsalis pedis intact bilat.   MSK: no deformity of the feet CV: no leg edema Skin:  no ulcer on the feet.  normal color and temp on the feet.  Neuro: sensation is intact to touch on the feet.        Assessment & Plan:  Insulin-requiring type 2 DM: uncertain control.  Noncompliance  with cbg recording: she is not a candidate for aggressive glycemic control.   HTN: with probable situational component.  Eye sxs, new, uncertain etiology   Patient is advised the following: Patient Instructions  On this type of insulin schedule, you should eat meals on a regular schedule.  If a meal is missed or significantly delayed,  your blood sugar could go low again.   Please continue the same insulins for now.   A diabetes blood test isrequested for you today.  We'll let you know about the results.  Please continue the same medication for blood pressure, for now.  Please see an eye specialist.  you will receive a phone call, about a day and time for an appointment check your blood sugar twice a day.  vary the time of day when you check, between before the 3 meals, and at bedtime.  also check if you have symptoms of your blood sugar being too high or too low.  please keep a record of the readings and bring it to your next appointment here.  You can write it on any piece of paper.  please call us sooner if your blood sugar goes below 70, or if you have a lot of readings over 200.   Please come back for a follow-up appointment in 1 month.

## 2015-11-15 NOTE — Telephone Encounter (Signed)
Patient would like to be referred to see Yvonne Reynolds, Ophthalmology specialist in [facility] Phone # Ophthalmology specialist in [place] Phone # [phone]

## 2015-11-15 NOTE — Telephone Encounter (Signed)
Ok, please tell this to [facility] when they call

## 2015-11-16 NOTE — Telephone Encounter (Signed)
[facility] is for patient care coordinator.  Please ask patient to state her dr preference when she is contacted for appt.

## 2015-11-21 ENCOUNTER — Telehealth: Payer: Self-pay | Admitting: Endocrinology

## 2015-11-21 NOTE — Telephone Encounter (Signed)
Pt has made an appt for herself to go see Dr. Iona Hansen so this is all taken care of and Neoma Laming is aware.

## 2015-11-21 NOTE — Telephone Encounter (Deleted)
We do not have a [employer] yet.

## 2015-11-29 DIAGNOSIS — H3582 Retinal ischemia: Secondary | ICD-10-CM | POA: Diagnosis not present

## 2015-11-29 DIAGNOSIS — H43812 Vitreous degeneration, left eye: Secondary | ICD-10-CM | POA: Diagnosis not present

## 2015-11-29 DIAGNOSIS — E113513 Type 2 diabetes mellitus with proliferative diabetic retinopathy with macular edema, bilateral: Secondary | ICD-10-CM | POA: Diagnosis not present

## 2015-12-06 DIAGNOSIS — E113512 Type 2 diabetes mellitus with proliferative diabetic retinopathy with macular edema, left eye: Secondary | ICD-10-CM | POA: Diagnosis not present

## 2015-12-16 NOTE — Progress Notes (Signed)
Subjective:    Patient ID: Yvonne Reynolds, female    DOB: April 05, 1945, 70 y.o.   MRN: [id number]  HPI Pt returns for f/u of diabetes mellitus: DM type: Insulin-requiring type 2 Dx'ed: 0301 Complications: retinopathy and nephropathy.  Therapy: insulin since 1999 GDM: never DKA: never Severe hypoglycemia: once, in mid-2017.   Pancreatitis: never.  Other: she declines multiple daily injections.  She takes R and N, both QAM.   Interval history: no cbg record, but states cbg's vary from 35-200's.  It is still lowest at HS, and highest fasting.  She says she never misses the insulin.  pt states she feels well in general.  Past Medical History:  Diagnosis Date  . Complication of anesthesia    states was hard to wake up after 1st EGD  . Depression   . GERD (gastroesophageal reflux disease)   . High cholesterol   . History of seizure    x 1 - due to hyperglycemia  . Hypertension    states under control with med., has been on med. > 20 yr.  . Insulin dependent diabetes mellitus ([place])   . Macular degeneration   . Nocturia   . Osteoarthritis    shoulder  . Osteoporosis   . Popping of temporomandibular joint on opening of jaw   . Sebaceous cyst 09/2014   chest wall - sternum  . Toenail fungus     Past Surgical History:  Procedure Laterality Date  . ABDOMINAL HYSTERECTOMY     partial  . BLADDER SUSPENSION    . CATARACT EXTRACTION W/ INTRAOCULAR LENS  IMPLANT, BILATERAL Bilateral   . CESAREAN SECTION    . ESOPHAGOGASTRODUODENOSCOPY  05/07/2004  . ESOPHAGOGASTRODUODENOSCOPY (EGD) WITH PROPOFOL  07/06/2013  . LOWER EXTREMITY ANGIOGRAM Bilateral 04/12/2015   Procedure: Lower Extremity Angiogram;  Surgeon: Conrad Weston, MD;  Location: [facility];  Service: Cardiovascular;  Laterality: Bilateral;  . MASS EXCISION N/A 10/17/2014   Procedure: EXCISION 2X3CM SUBCUTANEOUS CHEST WALL CYST;  Surgeon: Rolm Bookbinder, MD;  Location: [facility];  Service: General;   Laterality: N/A;  . ORIF ANKLE FRACTURE Left 07/23/2012   Procedure: OPEN REDUCTION INTERNAL FIXATION (ORIF) ANKLE FRACTURE;  Surgeon: Jessy Oto, MD;  Location: [facility];  Service: Orthopedics;  Laterality: Left;  . ORIF WRIST FRACTURE Left   . PERIPHERAL VASCULAR CATHETERIZATION N/A 04/12/2015   Procedure: Abdominal Aortogram;  Surgeon: Conrad Flemingsburg, MD;  Location: [facility];  Service: Cardiovascular;  Laterality: N/A;  . PERIPHERAL VASCULAR CATHETERIZATION Right 04/12/2015   Procedure: Peripheral Vascular Balloon Angioplasty;  Surgeon: Conrad Braddock, MD;  Location: [facility];  Service: Cardiovascular;  Laterality: Right;  rt sfa drug coated balloon    Social History   Social History  . Marital status: Widowed    Spouse name: N/A  . Number of children: 3  . Years of education: N/A   Occupational History  . 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 Smoking status: Former Smoker    Packs/day: 0.00    Quit date: 11/11/2011  . Smokeless tobacco: Never Used  . Alcohol use No  . Drug use: No  . Sexual activity: Not on file   Other Topics Concern  . Not on file   Social History Narrative   Widowed   Daily Caffeine Use-2 cups daily or more   Pt does not get regular exercise  Works [employer], 3rd shift    Current Outpatient Prescriptions on File Prior to Visit  Medication Sig Dispense Refill  . Blood Glucose Monitoring Suppl (TRUE METRIX AIR GLUCOSE METER) W/DEVICE KIT 1 Device by Other route once. 1 kit 0  . calcium carbonate (OS-CAL) 600 MG TABS Take 1,200 mg by mouth daily.     Mar[facility] Kitchen lisinopril (PRINIVIL,ZESTRIL) 10 MG tablet TAKE 1 TABLET EVERY DAY 90 tablet 1  . MONOJECT ULTRA COMFORT SYRINGE 29G X 1/2" 0.5 ML MISC USE AS DIRECTED 300 each 3  . omeprazole (PRILOSEC) 40 MG capsule Take 1 capsule (40 mg total) by mouth 2 (two) times daily. 180 capsule 3  . ranitidine (ZANTAC) 300 MG tablet Take 1 tablet (300 mg  total) by mouth at bedtime. 90 tablet 3  . simvastatin (ZOCOR) 80 MG tablet TAKE 1/2 TABLET AT BEDTIME 45 tablet 3  . TRUE METRIX BLOOD GLUCOSE TEST test strip TEST TWO TIMES DAILY 200 each 3  . TRUEPLUS LANCETS 28G MISC TEST TWO TIMES DAILY 200 each 3  . venlafaxine XR (EFFEXOR-XR) 75 MG 24 hr capsule TAKE 1 CAPSULE DAILY WITH BREAKFAST. 90 capsule 1  . vitamin D, CHOLECALCIFEROL, 400 UNITS tablet Take 400 Units by mouth daily.    . vitamin E (VITAMIN E) 400 UNIT capsule Take 400 Units by mouth daily.     No current facility-administered medications on file prior to visit.     Allergies  Allergen Reactions  . Procaine Other (See Comments)    Heart race  . Adhesive [Tape] Rash and Other (See Comments)    SKIN IRRITATION    Family History  Problem Relation Age of Onset  . Ovarian cancer Mother   . Stroke Father   . Diabetes Sister   . Stroke Sister   . Heart disease Sister     before age 63  . Diabetes Brother   . Heart disease Brother     before age 37  . Diabetes Sister     BP 134/62   Pulse (!) 102   Ht 5' (1.524 m)   Wt 180 lb (81.6 kg)   SpO2 94%   BMI 35.15 kg/m    Review of Systems Denies LOC.      Objective:   Physical Exam VITAL SIGNS:  See vs page GENERAL: no distress Pulses: dorsalis pedis intact bilat.   MSK: no deformity of the feet CV: no leg edema.  Skin:  no ulcer on the feet.  normal color and temp on the feet.  Neuro: sensation is intact to touch on the feet.     A1c=8.6%    Assessment & Plan:  Insulin-requiring type 2 DM: Based on the pattern of her cbg's, she needs some adjustment in her therapy  Patient is advised the following: Patient Instructions  On this type of insulin schedule, you should eat meals on a regular schedule.  If a meal is missed or significantly delayed, your blood sugar could go low again.   please take the 2 different injections (but both before breakfast), 20 units of N, and 80 units of R.  check your blood  sugar twice a day.  vary the time of day when you check, between before the 3 meals, and at bedtime.  also check if you have symptoms of your blood sugar being too high or too low.  please keep a record of the readings and bring it to your next appointment here.  You can write it on any  piece of paper.  please call us sooner if your blood sugar goes below 70, or if you have a lot of readings over 200.  Please come back for a follow-up appointment in 2 months.

## 2015-12-17 ENCOUNTER — Ambulatory Visit (INDEPENDENT_AMBULATORY_CARE_PROVIDER_SITE_OTHER): Payer: Commercial Managed Care - HMO | Admitting: Endocrinology

## 2015-12-17 ENCOUNTER — Encounter: Payer: Self-pay | Admitting: Endocrinology

## 2015-12-17 VITALS — BP 134/62 | HR 102 | Ht 60.0 in | Wt 180.0 lb

## 2015-12-17 DIAGNOSIS — E1151 Type 2 diabetes mellitus with diabetic peripheral angiopathy without gangrene: Secondary | ICD-10-CM

## 2015-12-17 DIAGNOSIS — Z794 Long term (current) use of insulin: Secondary | ICD-10-CM | POA: Diagnosis not present

## 2015-12-17 LAB — POCT GLYCOSYLATED HEMOGLOBIN (HGB A1C): Hemoglobin A1C: 8.6

## 2015-12-17 MED ORDER — INSULIN NPH (HUMAN) (ISOPHANE) 100 UNIT/ML ~~LOC~~ SUSP: 20.0000 [IU] | SUBCUTANEOUS | 11 refills | Status: DC

## 2015-12-17 MED ORDER — INSULIN REGULAR HUMAN 100 UNIT/ML IJ SOLN: 80.0000 [IU] | Freq: Every day | INTRAMUSCULAR | 11 refills | Status: DC

## 2015-12-17 NOTE — Patient Instructions (Addendum)
On this type of insulin schedule, you should eat meals on a regular schedule.  If a meal is missed or significantly delayed, your blood sugar could go low again.   please take the 2 different injections (but both before breakfast), 20 units of N, and 80 units of R.  check your blood sugar twice a day.  vary the time of day when you check, between before the 3 meals, and at bedtime.  also check if you have symptoms of your blood sugar being too high or too low.  please keep a record of the readings and bring it to your next appointment here.  You can write it on any piece of paper.  please call us sooner if your blood sugar goes below 70, or if you have a lot of readings over 200.  Please come back for a follow-up appointment in 2 months.

## 2015-12-21 ENCOUNTER — Other Ambulatory Visit: Payer: Self-pay | Admitting: Nurse Practitioner

## 2015-12-21 MED ORDER — SUCRALFATE 1 G PO TABS: 1.0000 g | ORAL_TABLET | Freq: Three times a day (TID) | ORAL | 1 refills | Status: DC

## 2015-12-29 DIAGNOSIS — E113399 Type 2 diabetes mellitus with moderate nonproliferative diabetic retinopathy without macular edema, unspecified eye: Secondary | ICD-10-CM | POA: Diagnosis not present

## 2015-12-29 DIAGNOSIS — H524 Presbyopia: Secondary | ICD-10-CM | POA: Diagnosis not present

## 2015-12-31 ENCOUNTER — Other Ambulatory Visit: Payer: Self-pay | Admitting: Endocrinology

## 2015-12-31 ENCOUNTER — Other Ambulatory Visit: Payer: Self-pay

## 2015-12-31 DIAGNOSIS — Z1231 Encounter for screening mammogram for malignant neoplasm of breast: Secondary | ICD-10-CM

## 2016-01-10 DIAGNOSIS — E113513 Type 2 diabetes mellitus with proliferative diabetic retinopathy with macular edema, bilateral: Secondary | ICD-10-CM | POA: Diagnosis not present

## 2016-01-10 DIAGNOSIS — H3582 Retinal ischemia: Secondary | ICD-10-CM | POA: Diagnosis not present

## 2016-01-10 DIAGNOSIS — H43812 Vitreous degeneration, left eye: Secondary | ICD-10-CM | POA: Diagnosis not present

## 2016-01-24 ENCOUNTER — Ambulatory Visit
Admission: RE | Admit: 2016-01-24 | Discharge: 2016-01-24 | Disposition: A | Discharge status: Home / Self Care | Payer: Commercial Managed Care - HMO | Source: Ambulatory Visit | Attending: Endocrinology | Admitting: Endocrinology

## 2016-01-24 DIAGNOSIS — Z1231 Encounter for screening mammogram for malignant neoplasm of breast: Secondary | ICD-10-CM | POA: Diagnosis not present

## 2016-02-15 ENCOUNTER — Ambulatory Visit: Payer: Commercial Managed Care - HMO | Admitting: Endocrinology

## 2016-02-21 ENCOUNTER — Encounter: Payer: Self-pay | Admitting: Endocrinology

## 2016-02-21 ENCOUNTER — Ambulatory Visit (INDEPENDENT_AMBULATORY_CARE_PROVIDER_SITE_OTHER): Payer: Commercial Managed Care - HMO | Admitting: Endocrinology

## 2016-02-21 ENCOUNTER — Telehealth: Payer: Self-pay | Admitting: Endocrinology

## 2016-02-21 VITALS — BP 134/82 | HR 104 | Ht 65.0 in | Wt 182.0 lb

## 2016-02-21 DIAGNOSIS — L989 Disorder of the skin and subcutaneous tissue, unspecified: Secondary | ICD-10-CM

## 2016-02-21 DIAGNOSIS — Z794 Long term (current) use of insulin: Secondary | ICD-10-CM

## 2016-02-21 DIAGNOSIS — E1151 Type 2 diabetes mellitus with diabetic peripheral angiopathy without gangrene: Secondary | ICD-10-CM | POA: Diagnosis not present

## 2016-02-21 DIAGNOSIS — R229 Localized swelling, mass and lump, unspecified: Secondary | ICD-10-CM

## 2016-02-21 LAB — POCT GLYCOSYLATED HEMOGLOBIN (HGB A1C): Hemoglobin A1C: 8.7

## 2016-02-21 MED ORDER — INSULIN NPH (HUMAN) (ISOPHANE) 100 UNIT/ML ~~LOC~~ SUSP: SUBCUTANEOUS | 11 refills | Status: DC

## 2016-02-21 NOTE — Telephone Encounter (Signed)
I contacted [employer]. She stated for any tissue removal it's better to place the specimen in Formal[place] because the Cytology solution contains alcohol. Langley Gauss is going to send over [employer]ormal[employer] for [place] to keep on hand.

## 2016-02-21 NOTE — Patient Instructions (Addendum)
On this type of insulin schedule, you should eat meals on a regular schedule.  If a meal is missed or significantly delayed, your blood sugar could go low again.   please take the 2 different injections (but both before breakfast), 15 units of N, and 80 units of R.  At bedtime, take 5 units of N. check your blood sugar twice a day.  vary the time of day when you check, between before the 3 meals, and at bedtime.  also check if you have symptoms of your blood sugar being too high or too low.  please keep a record of the readings and bring it to your next appointment here.  You can write it on any piece of paper.  please call us sooner if your blood sugar goes below 70, or if you have a lot of readings over 200.  We'll let you know about the skin nodule result. Please come back for a follow-up appointment in 2 months.

## 2016-02-21 NOTE — Progress Notes (Signed)
Subjective:    Patient ID: Yvonne Reynolds, female    DOB: 1945-10-16, 71 y.o.   MRN: [id number]  HPI Pt returns for f/u of diabetes mellitus: DM type: Insulin-requiring type 2 Dx'ed: 2979 Complications: retinopathy and nephropathy.  Therapy: insulin since 1999 GDM: never DKA: never Severe hypoglycemia: once, in mid-2017.   Pancreatitis: never.  Other: she declines multiple daily injections.  She takes R and N, both QAM.   Interval history: no cbg record, but states cbg's vary from 41-200's.  It is still lowest at HS, and highest fasting.  She says it is in the low to mid-100's at lunch and in the afternoon.  She says she never misses the insulin.  pt states she feels well in general.  Past Medical History:  Diagnosis Date  . Complication of anesthesia    states was hard to wake up after 1st EGD  . Depression   . GERD (gastroesophageal reflux disease)   . High cholesterol   . History of seizure    x 1 - due to hyperglycemia  . Hypertension    states under control with med., has been on med. > 20 yr.  . Insulin dependent diabetes mellitus ([place])   . Macular degeneration   . Nocturia   . Osteoarthritis    shoulder  . Osteoporosis   . Popping of temporomandibular joint on opening of jaw   . Sebaceous cyst 09/2014   chest wall - sternum  . Toenail fungus     Past Surgical History:  Procedure Laterality Date  . ABDOMINAL HYSTERECTOMY     partial  . BLADDER SUSPENSION    . CATARACT EXTRACTION W/ INTRAOCULAR LENS  IMPLANT, BILATERAL Bilateral   . CESAREAN SECTION    . ESOPHAGOGASTRODUODENOSCOPY  05/07/2004  . ESOPHAGOGASTRODUODENOSCOPY (EGD) WITH PROPOFOL  07/06/2013  . LOWER EXTREMITY ANGIOGRAM Bilateral 04/12/2015   Procedure: Lower Extremity Angiogram;  Surgeon: Conrad Naytahwaush, MD;  Location: [facility];  Service: Cardiovascular;  Laterality: Bilateral;  . MASS EXCISION N/A 10/17/2014   Procedure: EXCISION 2X3CM SUBCUTANEOUS CHEST WALL CYST;  Surgeon: Rolm Bookbinder, MD;  Location: [place];  Service: General;  Laterality: N/A;  . ORIF ANKLE FRACTURE Left 07/23/2012   Procedure: OPEN REDUCTION INTERNAL FIXATION (ORIF) ANKLE FRACTURE;  Surgeon: Jessy Oto, MD;  Location: [facility];  Service: Orthopedics;  Laterality: Left;  . ORIF WRIST FRACTURE Left   . PERIPHERAL VASCULAR CATHETERIZATION N/A 04/12/2015   Procedure: Abdominal Aortogram;  Surgeon: Conrad Pixley, MD;  Location: [facility];  Service: Cardiovascular;  Laterality: N/A;  . PERIPHERAL VASCULAR CATHETERIZATION Right 04/12/2015   Procedure: Peripheral Vascular Balloon Angioplasty;  Surgeon: Conrad Hublersburg, MD;  Location: [facility];  Service: Cardiovascular;  Laterality: Right;  rt sfa drug coated balloon    Social History   Social History  . Marital status: Widowed    Spouse name: N/A  . Number of children: 3  . Years of education: N/A   Occupational History  . 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 Smoking status: Former Smoker    Packs/day: 0.00    Quit date: 11/11/2011  . Smokeless tobacco: Never Used  . Alcohol use No  . Drug use: No  . Sexual activity: Not on file   Other Topics Concern  . Not on file   Social History Narrative   Widowed   Daily  Caffeine Use-2 cups daily or more   Pt does not get regular exercise   Works [employer], 3rd shift    Current Outpatient Prescriptions on File Prior to Visit  Medication Sig Dispense Refill  . Blood Glucose Monitoring Suppl (TRUE METRIX AIR GLUCOSE METER) W/DEVICE KIT 1 Device by Other route once. 1 kit 0  . calcium carbonate (OS-CAL) 600 MG TABS Take 1,200 mg by mouth daily.     . insulin regular (NOVOLIN R) 250 units/2.66m (100 units/mL) injection Inject 0.8 mLs (80 Units total) into the skin daily with breakfast. 30 mL 11  . lisinopril (PRINIVIL,ZESTRIL) 10 MG tablet TAKE 1 TABLET EVERY DAY 90 tablet 1  . MONOJECT ULTRA COMFORT SYRINGE  29G X 1/2" 0.5 ML MISC USE AS DIRECTED 300 each 3  . omeprazole (PRILOSEC) 40 MG capsule Take 1 capsule (40 mg total) by mouth 2 (two) times daily. 180 capsule 3  . ranitidine (ZANTAC) 300 MG tablet Take 1 tablet (300 mg total) by mouth at bedtime. 90 tablet 3  . simvastatin (ZOCOR) 80 MG tablet TAKE 1/2 TABLET AT BEDTIME 45 tablet 3  . TRUE METRIX BLOOD GLUCOSE TEST test strip TEST TWO TIMES DAILY 200 each 3  . TRUEPLUS LANCETS 28G MISC TEST TWO TIMES DAILY 200 each 3  . venlafaxine XR (EFFEXOR-XR) 75 MG 24 hr capsule TAKE 1 CAPSULE DAILY WITH BREAKFAST. 90 capsule 1  . vitamin D, CHOLECALCIFEROL, 400 UNITS tablet Take 400 Units by mouth daily.    . vitamin E (VITAMIN E) 400 UNIT capsule Take 400 Units by mouth daily.     No current facility-administered medications on file prior to visit.     Allergies  Allergen Reactions  . Procaine Other (See Comments)    Heart race  . Adhesive [Tape] Rash and Other (See Comments)    SKIN IRRITATION    Family History  Problem Relation Age of Onset  . Ovarian cancer Mother   . Stroke Father   . Diabetes Sister   . Stroke Sister   . Heart disease Sister     before age 69 . Diabetes Brother   . Heart disease Brother     before age 69 . Diabetes Sister     BP 134/82   Pulse (!) 104   Ht 5' 5"  (1.651 m)   Wt 182 lb (82.6 kg)   SpO2 93%   BMI 30.29 kg/m    Review of Systems Denies LOC.  She says skin nodule has become irritated.     Objective:   Physical Exam VITAL SIGNS:  See vs page GENERAL: no distress Pulses: dorsalis pedis intact bilat.   MSK: no deformity of the feet CV: no leg edema Skin:  no ulcer on the feet.  normal color and temp on the feet.  2 cm flat nodule at the right lower back Neuro: sensation is intact to touch on the feet  A1c=8.7%  shave bx, of skin nodule at the right lower back: consent obtained, signed form on chart.   The area is first sprayed with cooling agent local: xylocaine 2%, with  epinephrine prep: alcohol pad The nodule is shaved to skin level, with "dermablade." The base is cauterized with a battery-powered cauterizer.  no complications    Assessment & Plan:  Insulin-requiring type 2 DM, with retinopathy: she needs increased rx Skin nodule, worse.  Patient is advised the following: Patient Instructions  On this type of insulin schedule, you should eat meals on  a regular schedule.  If a meal is missed or significantly delayed, your blood sugar could go low again.   please take the 2 different injections (but both before breakfast), 15 units of N, and 80 units of R.  At bedtime, take 5 units of N. check your blood sugar twice a day.  vary the time of day when you check, between before the 3 meals, and at bedtime.  also check if you have symptoms of your blood sugar being too high or too low.  please keep a record of the readings and bring it to your next appointment here.  You can write it on any piece of paper.  please call us sooner if your blood sugar goes below 70, or if you have a lot of readings over 200.  We'll let you know about the skin nodule result. Please come back for a follow-up appointment in 2 months.

## 2016-02-21 NOTE — Telephone Encounter (Signed)
Yvonne Reynolds from [facility] called on the status of back legion specimen, has question on how order was put in.  Please advise [phone]

## 2016-03-13 DIAGNOSIS — E113513 Type 2 diabetes mellitus with proliferative diabetic retinopathy with macular edema, bilateral: Secondary | ICD-10-CM | POA: Diagnosis not present

## 2016-03-13 DIAGNOSIS — H4311 Vitreous hemorrhage, right eye: Secondary | ICD-10-CM | POA: Diagnosis not present

## 2016-03-13 DIAGNOSIS — H3582 Retinal ischemia: Secondary | ICD-10-CM | POA: Diagnosis not present

## 2016-03-13 DIAGNOSIS — H35371 Puckering of macula, right eye: Secondary | ICD-10-CM | POA: Diagnosis not present

## 2016-04-14 ENCOUNTER — Other Ambulatory Visit: Payer: Self-pay | Admitting: Endocrinology

## 2016-04-15 ENCOUNTER — Telehealth: Payer: Self-pay | Admitting: Endocrinology

## 2016-04-15 MED ORDER — SIMVASTATIN 80 MG PO TABS: 40.0000 mg | ORAL_TABLET | Freq: Every day | ORAL | 3 refills | Status: DC

## 2016-04-15 MED ORDER — LISINOPRIL 10 MG PO TABS: 10.0000 mg | ORAL_TABLET | Freq: Every day | ORAL | 3 refills | Status: DC

## 2016-04-15 NOTE — Telephone Encounter (Signed)
Refills for Lisinopril and Simvastatin submitted to [facility]. These are the only two medications Dr. Loanne Drilling refills other than the insulin. Patient purchases her insulin from [facility].

## 2016-04-15 NOTE — Telephone Encounter (Signed)
Pt called in and requested that all of her medications be renewed for the year and sent to the [facility].

## 2016-04-21 ENCOUNTER — Ambulatory Visit (INDEPENDENT_AMBULATORY_CARE_PROVIDER_SITE_OTHER): Payer: Medicare HMO | Admitting: Endocrinology

## 2016-04-21 VITALS — BP 126/64 | HR 97 | Wt 182.0 lb

## 2016-04-21 DIAGNOSIS — Z794 Long term (current) use of insulin: Secondary | ICD-10-CM | POA: Diagnosis not present

## 2016-04-21 DIAGNOSIS — E1151 Type 2 diabetes mellitus with diabetic peripheral angiopathy without gangrene: Secondary | ICD-10-CM | POA: Diagnosis not present

## 2016-04-21 LAB — POCT GLYCOSYLATED HEMOGLOBIN (HGB A1C): Hemoglobin A1C: 9.5

## 2016-04-21 MED ORDER — INSULIN NPH (HUMAN) (ISOPHANE) 100 UNIT/ML ~~LOC~~ SUSP: 10.0000 [IU] | Freq: Two times a day (BID) | SUBCUTANEOUS | 11 refills | Status: DC

## 2016-04-21 NOTE — Progress Notes (Signed)
Subjective:    Patient ID: Yvonne Reynolds, female    DOB: 07-30-1945, 71 y.o.   MRN: [id number]  HPI Pt returns for f/u of diabetes mellitus: DM type: Insulin-requiring type 2 Dx'ed: 7062 Complications: retinopathy and nephropathy.  Therapy: insulin since 1999 GDM: never DKA: never Severe hypoglycemia: once, in mid-2017.   Pancreatitis: never.  Other: she declines multiple daily injections.  She takes R and N, both QAM.   Interval history: no cbg record, but states cbg's vary from 57-302.  It is lowest in the afternoon, and highest fasting.  She says she never misses the insulin.  pt states she feels well in general.   Past Medical History:  Diagnosis Date  . Complication of anesthesia    states was hard to wake up after 1st EGD  . Depression   . GERD (gastroesophageal reflux disease)   . High cholesterol   . History of seizure    x 1 - due to hyperglycemia  . Hypertension    states under control with med., has been on med. > 20 yr.  . Insulin dependent diabetes mellitus ([place])   . Macular degeneration   . Nocturia   . Osteoarthritis    shoulder  . Osteoporosis   . Popping of temporomandibular joint on opening of jaw   . Sebaceous cyst 09/2014   chest wall - sternum  . Toenail fungus     Past Surgical History:  Procedure Laterality Date  . ABDOMINAL HYSTERECTOMY     partial  . BLADDER SUSPENSION    . CATARACT EXTRACTION W/ INTRAOCULAR LENS  IMPLANT, BILATERAL Bilateral   . CESAREAN SECTION    . ESOPHAGOGASTRODUODENOSCOPY  05/07/2004  . ESOPHAGOGASTRODUODENOSCOPY (EGD) WITH PROPOFOL  07/06/2013  . LOWER EXTREMITY ANGIOGRAM Bilateral 04/12/2015   Procedure: Lower Extremity Angiogram;  Surgeon: Conrad Townsend, MD;  Location: [facility];  Service: Cardiovascular;  Laterality: Bilateral;  . MASS EXCISION N/A 10/17/2014   Procedure: EXCISION 2X3CM SUBCUTANEOUS CHEST WALL CYST;  Surgeon: Rolm Bookbinder, MD;  Location: [place];  Service: General;   Laterality: N/A;  . ORIF ANKLE FRACTURE Left 07/23/2012   Procedure: OPEN REDUCTION INTERNAL FIXATION (ORIF) ANKLE FRACTURE;  Surgeon: Jessy Oto, MD;  Location: [place];  Service: Orthopedics;  Laterality: Left;  . ORIF WRIST FRACTURE Left   . PERIPHERAL VASCULAR CATHETERIZATION N/A 04/12/2015   Procedure: Abdominal Aortogram;  Surgeon: Conrad Bloomer, MD;  Location: [facility];  Service: Cardiovascular;  Laterality: N/A;  . PERIPHERAL VASCULAR CATHETERIZATION Right 04/12/2015   Procedure: Peripheral Vascular Balloon Angioplasty;  Surgeon: Conrad Gibson, MD;  Location: [facility];  Service: Cardiovascular;  Laterality: Right;  rt sfa drug coated balloon    Social History   Social History  . Marital status: Widowed    Spouse name: N/A  . Number of children: 3  . Years of education: N/A   Occupational History  . 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 Smoking status: Former Smoker    Packs/day: 0.00    Quit date: 11/11/2011  . Smokeless tobacco: Never Used  . Alcohol use No  . Drug use: No  . Sexual activity: Not on file   Other Topics Concern  . Not on file   Social History Narrative   Widowed   Daily Caffeine Use-2 cups daily or more   Pt does not get regular exercise  Works [employer], 3rd shift    Current Outpatient Prescriptions on File Prior to Visit  Medication Sig Dispense Refill  . Blood Glucose Monitoring Suppl (TRUE METRIX AIR GLUCOSE METER) W/DEVICE KIT 1 Device by Other route once. 1 kit 0  . calcium carbonate (OS-CAL) 600 MG TABS Take 1,200 mg by mouth daily.     . insulin regular (NOVOLIN R) 250 units/2.40m (100 units/mL) injection Inject 0.8 mLs (80 Units total) into the skin daily with breakfast. 30 mL 11  . lisinopril (PRINIVIL,ZESTRIL) 10 MG tablet Take 1 tablet (10 mg total) by mouth daily. 90 tablet 3  . MONOJECT ULTRA COMFORT SYRINGE 29G X 1/2" 0.5 ML MISC USE AS DIRECTED 90 each 3  .  omeprazole (PRILOSEC) 40 MG capsule Take 1 capsule (40 mg total) by mouth 2 (two) times daily. 180 capsule 3  . ranitidine (ZANTAC) 300 MG tablet Take 1 tablet (300 mg total) by mouth at bedtime. 90 tablet 3  . simvastatin (ZOCOR) 80 MG tablet Take 0.5 tablets (40 mg total) by mouth at bedtime. 45 tablet 3  . TRUE METRIX BLOOD GLUCOSE TEST test strip TEST TWO TIMES DAILY 200 each 3  . TRUEPLUS LANCETS 28G MISC TEST TWO TIMES DAILY 200 each 3  . venlafaxine XR (EFFEXOR-XR) 75 MG 24 hr capsule TAKE 1 CAPSULE DAILY WITH BREAKFAST. 90 capsule 1  . vitamin D, CHOLECALCIFEROL, 400 UNITS tablet Take 400 Units by mouth daily.    . vitamin E (VITAMIN E) 400 UNIT capsule Take 400 Units by mouth daily.     No current facility-administered medications on file prior to visit.     Allergies  Allergen Reactions  . Procaine Other (See Comments)    Heart race  . Adhesive [Tape] Rash and Other (See Comments)    SKIN IRRITATION    Family History  Problem Relation Age of Onset  . Ovarian cancer Mother   . Stroke Father   . Diabetes Sister   . Stroke Sister   . Heart disease Sister     before age 69 . Diabetes Brother   . Heart disease Brother     before age 69 . Diabetes Sister     BP 126/64   Pulse 97   Wt 182 lb (82.6 kg)   SpO2 96%   BMI 30.29 kg/m    Review of Systems She has weight gain.      Objective:   Physical Exam VITAL SIGNS:  See vs page GENERAL: no distress Pulses: dorsalis pedis intact bilat.   MSK: no deformity of the feet CV: no leg edema Skin:  no ulcer on the feet.  normal color and temp on the feet.  Neuro: sensation is intact to touch on the feet   a1c=9.3%    Assessment & Plan:  Insulin-requiring type 2 DM, with retinopathy: Based on the pattern of her cbg's, she needs some adjustment in her therapy.  Patient is advised the following: Patient Instructions  On this type of insulin schedule, you should eat meals on a regular schedule.  If a meal is  missed or significantly delayed, your blood sugar could go low again.   please take the 2 different injections (but both before breakfast), 10 units of N, and 80 units of R.  At bedtime, take 10 units of N. check your blood sugar twice a day.  vary the time of day when you check, between before the 3 meals, and at bedtime.  also check if  you have symptoms of your blood sugar being too high or too low.  please keep a record of the readings and bring it to your next appointment here.  You can write it on any piece of paper.  please call us sooner if your blood sugar goes below 70, or if you have a lot of readings over 200.  Please come back for a follow-up appointment in 2 months.

## 2016-04-21 NOTE — Patient Instructions (Addendum)
On this type of insulin schedule, you should eat meals on a regular schedule.  If a meal is missed or significantly delayed, your blood sugar could go low again.   please take the 2 different injections (but both before breakfast), 10 units of N, and 80 units of R.  At bedtime, take 10 units of N. check your blood sugar twice a day.  vary the time of day when you check, between before the 3 meals, and at bedtime.  also check if you have symptoms of your blood sugar being too high or too low.  please keep a record of the readings and bring it to your next appointment here.  You can write it on any piece of paper.  please call us sooner if your blood sugar goes below 70, or if you have a lot of readings over 200.  Please come back for a follow-up appointment in 2 months.

## 2016-05-08 ENCOUNTER — Other Ambulatory Visit: Payer: Self-pay | Admitting: Endocrinology

## 2016-05-13 ENCOUNTER — Encounter: Payer: Self-pay | Admitting: Gastroenterology

## 2016-06-04 ENCOUNTER — Other Ambulatory Visit: Payer: Self-pay | Admitting: Endocrinology

## 2016-06-09 ENCOUNTER — Other Ambulatory Visit: Payer: Self-pay

## 2016-06-09 ENCOUNTER — Telehealth: Payer: Self-pay | Admitting: Endocrinology

## 2016-06-09 MED ORDER — "INSULIN SYRINGE 31G X 5/16"" 0.3 ML MISC": 4 refills | Status: DC

## 2016-06-09 MED ORDER — "INSULIN SYRINGE 29G X 1/2"" 0.5 ML MISC": 0 refills | Status: DC

## 2016-06-09 NOTE — Telephone Encounter (Signed)
Pt needs one box of syringes [facility] to hold her until her shipment from [facility] which we also need to call in today  She is out

## 2016-06-09 NOTE — Telephone Encounter (Signed)
Ordered

## 2016-06-09 NOTE — Telephone Encounter (Signed)
[facility] can't get the 29G needle, Patient need a prescription for the 31 gage needle. Please advise

## 2016-06-10 NOTE — Telephone Encounter (Signed)
Pt needs syringes 31 gauge that can hold 80 u at a time and pt needs 2 needles a day

## 2016-06-12 MED ORDER — "INSULIN SYRINGE 31G X 5/16"" 1 ML MISC": 2 refills | Status: DC

## 2016-06-12 NOTE — Telephone Encounter (Signed)
Refill submitted for 31 G 1ML syringe to [facility] on [address].

## 2016-06-13 ENCOUNTER — Telehealth: Payer: Self-pay | Admitting: Endocrinology

## 2016-06-13 NOTE — Telephone Encounter (Signed)
[facility] to verify script order and noticed the syringes were a different gauge (29 gauge X 1/2 inch) Patient had a different size sent to [facility]. Called patient and she stated that she would rather have the 29 gauge from [facility] if they have an alternative to the Monoject syringe she has been receiving. [employer] is sending patient the Easytouch syringes 29 gauge 1/2 inch .5 ml, gave 90 day supply with 2 refills.

## 2016-06-13 NOTE — Telephone Encounter (Signed)
[employer] called to verify the prescription for the Syringes, CB# [id number], REF# [id number]

## 2016-06-20 ENCOUNTER — Ambulatory Visit (INDEPENDENT_AMBULATORY_CARE_PROVIDER_SITE_OTHER): Payer: Medicare HMO | Admitting: Endocrinology

## 2016-06-20 ENCOUNTER — Encounter: Payer: Self-pay | Admitting: Endocrinology

## 2016-06-20 VITALS — BP 132/70 | HR 104 | Ht 62.0 in | Wt 179.0 lb

## 2016-06-20 DIAGNOSIS — I739 Peripheral vascular disease, unspecified: Secondary | ICD-10-CM

## 2016-06-20 LAB — POCT GLYCOSYLATED HEMOGLOBIN (HGB A1C): Hemoglobin A1C: 9.4

## 2016-06-20 NOTE — Progress Notes (Signed)
Subjective:    Patient ID: Yvonne Reynolds, female    DOB: 12/12/1945, 71 y.o.   MRN: [id number]  HPI Pt returns for f/u of diabetes mellitus: DM type: Insulin-requiring type 2 Dx'ed: 7414 Complications: retinopathy and nephropathy.  Therapy: insulin since 1999 GDM: never DKA: never Severe hypoglycemia: once, in mid-2017.   Pancreatitis: never.  Other: she declines multiple daily injections.  She takes R and N, both QAM.   Interval history: no cbg record, but states cbg's vary from 52-330.  It is still lowest in the afternoon, and highest fasting.  She says she never misses the insulin.  pt states she feels well in general.   She has pain at the calf areas with walking.  It resolves with rest.  Past Medical History:  Diagnosis Date  . Complication of anesthesia    states was hard to wake up after 1st EGD  . Depression   . GERD (gastroesophageal reflux disease)   . High cholesterol   . History of seizure    x 1 - due to hyperglycemia  . Hypertension    states under control with med., has been on med. > 20 yr.  . Insulin dependent diabetes mellitus ([place])   . Macular degeneration   . Nocturia   . Osteoarthritis    shoulder  . Osteoporosis   . Popping of temporomandibular joint on opening of jaw   . Sebaceous cyst 09/2014   chest wall - sternum  . Toenail fungus     Past Surgical History:  Procedure Laterality Date  . ABDOMINAL HYSTERECTOMY     partial  . BLADDER SUSPENSION    . CATARACT EXTRACTION W/ INTRAOCULAR LENS  IMPLANT, BILATERAL Bilateral   . CESAREAN SECTION    . ESOPHAGOGASTRODUODENOSCOPY  05/07/2004  . ESOPHAGOGASTRODUODENOSCOPY (EGD) WITH PROPOFOL  07/06/2013  . LOWER EXTREMITY ANGIOGRAM Bilateral 04/12/2015   Procedure: Lower Extremity Angiogram;  Surgeon: Conrad Kingsland, MD;  Location: [place];  Service: Cardiovascular;  Laterality: Bilateral;  . MASS EXCISION N/A 10/17/2014   Procedure: EXCISION 2X3CM SUBCUTANEOUS CHEST WALL CYST;  Surgeon:  Rolm Bookbinder, MD;  Location: [place];  Service: General;  Laterality: N/A;  . ORIF ANKLE FRACTURE Left 07/23/2012   Procedure: OPEN REDUCTION INTERNAL FIXATION (ORIF) ANKLE FRACTURE;  Surgeon: Jessy Oto, MD;  Location: [place];  Service: Orthopedics;  Laterality: Left;  . ORIF WRIST FRACTURE Left   . PERIPHERAL VASCULAR CATHETERIZATION N/A 04/12/2015   Procedure: Abdominal Aortogram;  Surgeon: Conrad Goodell, MD;  Location: [place] [facility];  Service: Cardiovascular;  Laterality: N/A;  . PERIPHERAL VASCULAR CATHETERIZATION Right 04/12/2015   Procedure: Peripheral Vascular Balloon Angioplasty;  Surgeon: Conrad [place]boro, MD;  Location: [place] [facility];  Service: Cardiovascular;  Laterality: Right;  rt sfa drug coated balloon    Social History   Social History  . Marital status: Widowed    Spouse name: N/A  . Number of children: 3  . 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 Smoking status: Former Smoker    Packs/day: 0.00    Quit date: 11/11/2011  . Smokeless tobacco: Never Used  . Alcohol use No  . Drug use: No  . Sexual activity: Not on file   Other Topics Concern  . Not on file   Social History Narrative   Widowed  Daily Caffeine Use-2 cups daily or more   Pt does not get regular exercise   Works [employer], 3rd shift    Current Outpatient Prescriptions on File Prior to Visit  Medication Sig Dispense Refill  . Blood Glucose Monitoring Suppl (TRUE METRIX AIR GLUCOSE METER) W/DEVICE KIT 1 Device by Other route once. 1 kit 0  . calcium carbonate (OS-CAL) 600 MG TABS Take 1,200 mg by mouth daily.     . insulin NPH Human (NOVOLIN N) 100 UNIT/ML injection Inject 0.1 mLs (10 Units total) into the skin 2 (two) times daily. 10 mL 11  . insulin regular (NOVOLIN R) 250 units/2.70m (100 units/mL) injection Inject 0.8 mLs (80 Units total) into the skin daily with  breakfast. 30 mL 11  . lisinopril (PRINIVIL,ZESTRIL) 10 MG tablet Take 1 tablet (10 mg total) by mouth daily. 90 tablet 3  . omeprazole (PRILOSEC) 40 MG capsule Take 1 capsule (40 mg total) by mouth 2 (two) times daily. 180 capsule 3  . ranitidine (ZANTAC) 300 MG tablet Take 1 tablet (300 mg total) by mouth at bedtime. 90 tablet 3  . simvastatin (ZOCOR) 80 MG tablet Take 0.5 tablets (40 mg total) by mouth at bedtime. 45 tablet 3  . TRUE METRIX BLOOD GLUCOSE TEST test strip TEST TWO TIMES DAILY 200 each 3  . TRUEPLUS LANCETS 28G MISC TEST TWO TIMES DAILY 200 each 3  . venlafaxine XR (EFFEXOR-XR) 75 MG 24 hr capsule TAKE 1 CAPSULE DAILY WITH BREAKFAST. 90 capsule 1  . vitamin D, CHOLECALCIFEROL, 400 UNITS tablet Take 400 Units by mouth daily.    . vitamin E (VITAMIN E) 400 UNIT capsule Take 400 Units by mouth daily.     No current facility-administered medications on file prior to visit.     Allergies  Allergen Reactions  . Procaine Other (See Comments)    Heart race  . Adhesive [Tape] Rash and Other (See Comments)    SKIN IRRITATION    Family History  Problem Relation Age of Onset  . Ovarian cancer Mother   . Stroke Father   . Diabetes Sister   . Stroke Sister   . Heart disease Sister        before age 69 . Diabetes Brother   . Heart disease Brother        before age 373 . Diabetes Sister     BP 132/70   Pulse (!) 104   Ht 5' 2"  (1.575 m)   Wt 179 lb (81.2 kg)   SpO2 94%   BMI 32.74 kg/m   Review of Systems Denies LOC.     Objective:   Physical Exam VITAL SIGNS:  See vs page GENERAL: no distress Pulses: dorsalis pedis intact bilat.   MSK: no deformity of the feet CV: no leg edema.   Skin:  no ulcer on the feet.  normal color and temp on the feet.  Neuro: sensation is intact to touch on the feet.    A1c=9.4%    Assessment & Plan:  Insulin-requiring type 2 DM: Based on the pattern of her cbg's, she needs some adjustment in her therapy. Claudication,  new.  Patient Instructions  On this type of insulin schedule, you should eat meals on a regular schedule.  If a meal is missed or significantly delayed, your blood sugar could go low again.   At breakfast, take 80 units of R.  At bedtime, take 20 units of N. Please go back to see the blood  vessel specialist.  you will receive a phone call, about a day and time for an appointment check your blood sugar twice a day.  vary the time of day when you check, between before the 3 meals, and at bedtime.  also check if you have symptoms of your blood sugar being too high or too low.  please keep a record of the readings and bring it to your next appointment here.  You can write it on any piece of paper.  please call us sooner if your blood sugar goes below 70, or if you have a lot of readings over 200.  Please come back for a follow-up appointment in 3 months.

## 2016-06-20 NOTE — Patient Instructions (Addendum)
On this type of insulin schedule, you should eat meals on a regular schedule.  If a meal is missed or significantly delayed, your blood sugar could go low again.   At breakfast, take 80 units of R.  At bedtime, take 20 units of N. Please go back to see the blood vessel specialist.  you will receive a phone call, about a day and time for an appointment check your blood sugar twice a day.  vary the time of day when you check, between before the 3 meals, and at bedtime.  also check if you have symptoms of your blood sugar being too high or too low.  please keep a record of the readings and bring it to your next appointment here.  You can write it on any piece of paper.  please call us sooner if your blood sugar goes below 70, or if you have a lot of readings over 200.  Please come back for a follow-up appointment in 3 months.

## 2016-06-27 ENCOUNTER — Other Ambulatory Visit: Payer: Self-pay

## 2016-06-27 DIAGNOSIS — I739 Peripheral vascular disease, unspecified: Secondary | ICD-10-CM

## 2016-06-27 DIAGNOSIS — R52 Pain, unspecified: Secondary | ICD-10-CM

## 2016-06-27 DIAGNOSIS — I7092 Chronic total occlusion of artery of the extremities: Secondary | ICD-10-CM

## 2016-06-30 ENCOUNTER — Other Ambulatory Visit: Payer: Self-pay

## 2016-06-30 ENCOUNTER — Encounter: Payer: Self-pay | Admitting: Vascular Surgery

## 2016-07-01 ENCOUNTER — Ambulatory Visit (HOSPITAL_COMMUNITY)
Admission: RE | Admit: 2016-07-01 | Discharge: 2016-07-01 | Disposition: A | Discharge status: Home / Self Care | Payer: Medicare HMO | Source: Ambulatory Visit | Attending: Vascular Surgery | Admitting: Vascular Surgery

## 2016-07-01 DIAGNOSIS — I739 Peripheral vascular disease, unspecified: Secondary | ICD-10-CM | POA: Diagnosis not present

## 2016-07-01 DIAGNOSIS — R52 Pain, unspecified: Secondary | ICD-10-CM | POA: Insufficient documentation

## 2016-07-02 ENCOUNTER — Encounter: Payer: Self-pay | Admitting: Gastroenterology

## 2016-07-02 ENCOUNTER — Encounter: Payer: Self-pay | Admitting: Vascular Surgery

## 2016-07-02 ENCOUNTER — Ambulatory Visit (INDEPENDENT_AMBULATORY_CARE_PROVIDER_SITE_OTHER): Payer: Medicare HMO | Admitting: Vascular Surgery

## 2016-07-02 VITALS — BP 158/80 | HR 102 | Temp 99.1°F | Resp 14 | Ht 61.0 in | Wt 178.0 lb

## 2016-07-02 DIAGNOSIS — I739 Peripheral vascular disease, unspecified: Secondary | ICD-10-CM

## 2016-07-02 NOTE — Progress Notes (Signed)
Referring Physician: Dr Loanne Drilling  Patient name: Yvonne Reynolds MRN: [id number] DOB: 1945/12/13 Sex: female  REASON FOR CONSULT: Bilateral leg pain  HPI: Yvonne Reynolds is a 71 y.o. female with known history of peripheral arterial disease. She previously had a right superficial femoral artery angioplasty by Dr. Bridgett Larsson in 2017 for a nonhealing wound of her right fifth toe. This completely healed subsequent to the procedure. The patient states that she has had pain in both legs or about 6 years. She states that she gets pain in her calf in the anterior and posterior portions after walking about 2 blocks. This then resolves after resting. She states that it did not change or improve after her angioplasty. However, she was happy that the toe did heal. She currently is not on aspirin. She states he gives her severe reflux and aggravates her Barrett's esophagus. She is on a statin. She wonders whether or not this may be related to her leg pain. Other medical problems include hypertension diabetes elevated cholesterol all of which are currently stable. She did quit smoking 6 years ago.  Past Medical History:  Diagnosis Date  . Complication of anesthesia    states was hard to wake up after 1st EGD  . Depression   . GERD (gastroesophageal reflux disease)   . High cholesterol   . History of seizure    x 1 - due to hyperglycemia  . Hypertension    states under control with med., has been on med. > 20 yr.  . Insulin dependent diabetes mellitus ([place])   . Macular degeneration   . Nocturia   . Osteoarthritis    shoulder  . Osteoporosis   . Popping of temporomandibular joint on opening of jaw   . Sebaceous cyst 09/2014   chest wall - sternum  . Toenail fungus    Past Surgical History:  Procedure Laterality Date  . ABDOMINAL HYSTERECTOMY     partial  . BLADDER SUSPENSION    . CATARACT EXTRACTION W/ INTRAOCULAR LENS  IMPLANT, BILATERAL Bilateral   . CESAREAN SECTION    .  ESOPHAGOGASTRODUODENOSCOPY  05/07/2004  . ESOPHAGOGASTRODUODENOSCOPY (EGD) WITH PROPOFOL  07/06/2013  . LOWER EXTREMITY ANGIOGRAM Bilateral 04/12/2015   Procedure: Lower Extremity Angiogram;  Surgeon: Conrad [place], MD;  Location: [facility];  Service: Cardiovascular;  Laterality: Bilateral;  . MASS EXCISION N/A 10/17/2014   Procedure: EXCISION 2X3CM SUBCUTANEOUS CHEST WALL CYST;  Surgeon: Rolm Bookbinder, MD;  Location: [facility];  Service: General;  Laterality: N/A;  . ORIF ANKLE FRACTURE Left 07/23/2012   Procedure: OPEN REDUCTION INTERNAL FIXATION (ORIF) ANKLE FRACTURE;  Surgeon: Jessy Oto, MD;  Location: [facility];  Service: Orthopedics;  Laterality: Left;  . ORIF WRIST FRACTURE Left   . PERIPHERAL VASCULAR CATHETERIZATION N/A 04/12/2015   Procedure: Abdominal Aortogram;  Surgeon: Conrad [place], MD;  Location: [facility];  Service: Cardiovascular;  Laterality: N/A;  . PERIPHERAL VASCULAR CATHETERIZATION Right 04/12/2015   Procedure: Peripheral Vascular Balloon Angioplasty;  Surgeon: Conrad Bristol, MD;  Location: [facility];  Service: Cardiovascular;  Laterality: Right;  rt sfa drug coated balloon    Family History  Problem Relation Age of Onset  . Ovarian cancer Mother   . Stroke Father   . Diabetes Sister   . Stroke Sister   . Heart disease Sister        before age 17  . Diabetes Brother   . Heart disease  Brother        before age 18  . Diabetes Sister     SOCIAL HISTORY: Social History   Social History  . Marital status: Widowed    Spouse name: N/A  . Number of children: 3  . Years of education: N/A   Occupational History  . 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 Smoking status: Former Smoker    Packs/day: 0.00    Quit date: 11/11/2011  . Smokeless tobacco: Never Used  . Alcohol use No  . Drug use: No  . Sexual activity: Not on file   Other Topics Concern  . Not on file    Social History Narrative   Widowed   Daily Caffeine Use-2 cups daily or more   Pt does not get regular exercise   Works [employer], 3rd shift    Allergies  Allergen Reactions  . Procaine Other (See Comments)    Heart race  . Adhesive [Tape] Rash and Other (See Comments)    SKIN IRRITATION    Current Outpatient Prescriptions  Medication Sig Dispense Refill  . Blood Glucose Monitoring Suppl (TRUE METRIX AIR GLUCOSE METER) W/DEVICE KIT 1 Device by Other route once. 1 kit 0  . calcium carbonate (OS-CAL) 600 MG TABS Take 1,200 mg by mouth daily.     . insulin NPH Human (NOVOLIN N) 100 UNIT/ML injection Inject 0.1 mLs (10 Units total) into the skin 2 (two) times daily. 10 mL 11  . insulin regular (NOVOLIN R) 250 units/2.88m (100 units/mL) injection Inject 0.8 mLs (80 Units total) into the skin daily with breakfast. 30 mL 11  . INSULIN SYRINGE 1CC/29G 29G X 1/2" 1 ML MISC by Does not apply route.    .Marland Kitchenlisinopril (PRINIVIL,ZESTRIL) 10 MG tablet Take 1 tablet (10 mg total) by mouth daily. 90 tablet 3  . omeprazole (PRILOSEC) 40 MG capsule Take 1 capsule (40 mg total) by mouth 2 (two) times daily. 180 capsule 3  . ranitidine (ZANTAC) 300 MG tablet Take 1 tablet (300 mg total) by mouth at bedtime. 90 tablet 3  . simvastatin (ZOCOR) 80 MG tablet Take 0.5 tablets (40 mg total) by mouth at bedtime. 45 tablet 3  . TRUE METRIX BLOOD GLUCOSE TEST test strip TEST TWO TIMES DAILY 200 each 3  . TRUEPLUS LANCETS 28G MISC TEST TWO TIMES DAILY 200 each 3  . venlafaxine XR (EFFEXOR-XR) 75 MG 24 hr capsule TAKE 1 CAPSULE DAILY WITH BREAKFAST. 90 capsule 1  . vitamin D, CHOLECALCIFEROL, 400 UNITS tablet Take 400 Units by mouth daily.    . vitamin E (VITAMIN E) 400 UNIT capsule Take 400 Units by mouth daily.     No current facility-administered medications for this visit.     ROS:   General:  No weight loss, Fever, chills  HEENT: No recent headaches, no nasal bleeding, no visual changes, no sore  throat  Neurologic: No dizziness, blackouts, seizures. No recent symptoms of stroke or mini- stroke. No recent episodes of slurred speech, or temporary blindness.  Cardiac: No recent episodes of chest pain/pressure, no shortness of breath at rest.  No shortness of breath with exertion.  Denies history of atrial fibrillation or irregular heartbeat  Vascular: No history of rest pain in feet.  No history of claudication.  No history of non-healing ulcer, No history of DVT   Pulmonary: No home oxygen, no productive cough, no hemoptysis,  No asthma or wheezing  Musculoskeletal:  [ ]  Arthritis, [ ]  Low back pain,  [ ]  Joint pain  Hematologic:No history of hypercoagulable state.  No history of easy bleeding.  No history of anemia  Gastrointestinal: No hematochezia or melena,  No gastroesophageal reflux, no trouble swallowing  Urinary: [ ]  chronic Kidney disease, [ ]  on HD - [ ]  MWF or [ ]  TTHS, [ ]  Burning with urination, [ ]  Frequent urination, [ ]  Difficulty urinating;   Skin: No rashes  Psychological: No history of anxiety,  No history of depression   Physical Examination  Vitals:   07/02/16 1110 07/02/16 1113  BP: (!) 147/78 (!) 158/80  Pulse: (!) 102 (!) 102  Resp: 14   Temp: 99.1 F (37.3 C)   Weight: 178 lb (80.7 kg)   Height: 5' 1"  (1.549 m)     Body mass index is 33.63 kg/m.  General:  Alert and oriented, no acute distress HEENT: Normal Neck: No bruit or JVD Pulmonary: Clear to auscultation bilaterally Cardiac: Regular Rate and Rhythm without murmur Abdomen: Soft, non-tender, non-distended, no mass, no scars Skin: No rash, no ulcer or open wound Extremity Pulses:  2+ radial, brachial, femoral, 2+ right 1+ left dorsalis pedis, absent posterior tibial pulses bilaterally Musculoskeletal: No deformity or edema  Neurologic: Upper and lower extremity motor 5/5 and symmetric  DATA:  Patient had bilateral ABIs performed today which were 0.96 biphasic to triphasic right  side 1.19 triphasic to biphasic left side. Toe pressure was greater than 140 bilaterally.  ASSESSMENT:  Patient with bilateral leg pain with ambulation. She has a palpable pulse in both feet. She has normal ABIs. She could have some element of aortoiliac occlusive disease that might not be found on ABI exam alone. However, her recent angiogram was less than a year ago and there was no significant inflow disease at that point so for her to progress rapidly over a year would not really explain the situation. Especially in light of the fact that her leg pain has been present for 6 years. Most likely this leg pain has a musculoskeletal or neurologic etiology rather than arterial.   PLAN:  Patient will follow-up in 6 months time with our nurse practitioner with repeat ABIs and bilateral lower extremity duplex exam.  Ruta Hinds, MD [facility] Office: [phone] Pager: [phone]

## 2016-07-17 NOTE — Addendum Note (Signed)
Addended by: Lianne Cure A on: 07/17/2016 03:32 PM   Modules accepted: Orders

## 2016-07-21 ENCOUNTER — Telehealth: Payer: Self-pay | Admitting: Endocrinology

## 2016-07-21 NOTE — Telephone Encounter (Signed)
Patient was told she needs her A1C checked. I don't see orders. Please advise.  Thank you,  -LL

## 2016-07-21 NOTE — Telephone Encounter (Signed)
I contacted the patient. She stated she was having a Endoscopy done on 08/06/2016 and needs to have her A1C checked before she had the procedure. The schedule is 100% each day until July 19th. Please advise what time and date.  Thanks!

## 2016-07-21 NOTE — Telephone Encounter (Signed)
As far as I know, next a1c will due at next ov, not now.

## 2016-07-22 ENCOUNTER — Telehealth: Payer: Self-pay

## 2016-07-22 NOTE — Telephone Encounter (Signed)
Returning call from [facility].  Thank you,  -LL

## 2016-07-22 NOTE — Telephone Encounter (Signed)
Called and notified patient of MD note, left call back number if any questions.

## 2016-07-22 NOTE — Telephone Encounter (Signed)
Called patient back, patient had no questions at this time, and had received previous message.

## 2016-08-20 ENCOUNTER — Encounter: Payer: Medicare HMO | Admitting: Gastroenterology

## 2016-09-12 ENCOUNTER — Telehealth: Payer: Self-pay

## 2016-09-12 ENCOUNTER — Other Ambulatory Visit: Payer: Self-pay

## 2016-09-12 MED ORDER — INSULIN REGULAR HUMAN 100 UNIT/ML IJ SOLN: 80.0000 [IU] | Freq: Every day | INTRAMUSCULAR | 11 refills | Status: DC

## 2016-09-12 MED ORDER — INSULIN NPH (HUMAN) (ISOPHANE) 100 UNIT/ML ~~LOC~~ SUSP: 10.0000 [IU] | Freq: Two times a day (BID) | SUBCUTANEOUS | 11 refills | Status: DC

## 2016-09-12 NOTE — Telephone Encounter (Signed)
Talked to patient and told her I would send over prescription for Novolin N & R to [facility] on [address]. I told her I would call to verify that they had prescription & would call her back.

## 2016-09-12 NOTE — Telephone Encounter (Signed)
Called patient & notified her that prescription were sent in. Also notified her that I called [facility] to verify they had Rembert in stock.

## 2016-09-22 ENCOUNTER — Other Ambulatory Visit: Payer: Self-pay | Admitting: Gastroenterology

## 2016-09-22 ENCOUNTER — Other Ambulatory Visit: Payer: Self-pay | Admitting: Endocrinology

## 2016-09-22 DIAGNOSIS — K2270 Barrett's esophagus without dysplasia: Secondary | ICD-10-CM

## 2016-09-22 DIAGNOSIS — K219 Gastro-esophageal reflux disease without esophagitis: Secondary | ICD-10-CM

## 2016-09-24 ENCOUNTER — Telehealth: Payer: Self-pay | Admitting: Endocrinology

## 2016-09-24 NOTE — Telephone Encounter (Signed)
Routing to you °

## 2016-09-24 NOTE — Telephone Encounter (Signed)
Patient checking to see if someone called her. No note in epic at the time of call, call patient back if needed. Okay to leave a detailed message.

## 2016-09-26 ENCOUNTER — Ambulatory Visit (INDEPENDENT_AMBULATORY_CARE_PROVIDER_SITE_OTHER): Payer: Medicare HMO | Admitting: Endocrinology

## 2016-09-26 ENCOUNTER — Encounter: Payer: Self-pay | Admitting: Endocrinology

## 2016-09-26 VITALS — BP 126/62 | HR 95 | Wt 177.8 lb

## 2016-09-26 DIAGNOSIS — Z794 Long term (current) use of insulin: Secondary | ICD-10-CM | POA: Diagnosis not present

## 2016-09-26 DIAGNOSIS — L709 Acne, unspecified: Secondary | ICD-10-CM | POA: Diagnosis not present

## 2016-09-26 DIAGNOSIS — E1151 Type 2 diabetes mellitus with diabetic peripheral angiopathy without gangrene: Secondary | ICD-10-CM | POA: Diagnosis not present

## 2016-09-26 DIAGNOSIS — M6530 Trigger finger, unspecified finger: Secondary | ICD-10-CM

## 2016-09-26 DIAGNOSIS — Z23 Encounter for immunization: Secondary | ICD-10-CM

## 2016-09-26 LAB — POCT GLYCOSYLATED HEMOGLOBIN (HGB A1C): Hemoglobin A1C: 9.8

## 2016-09-26 MED ORDER — INSULIN NPH (HUMAN) (ISOPHANE) 100 UNIT/ML ~~LOC~~ SUSP: 10.0000 [IU] | Freq: Two times a day (BID) | SUBCUTANEOUS | 11 refills | Status: DC

## 2016-09-26 MED ORDER — INSULIN REGULAR HUMAN 100 UNIT/ML IJ SOLN: 90.0000 [IU] | Freq: Every day | INTRAMUSCULAR | 11 refills | Status: DC

## 2016-09-26 NOTE — Patient Instructions (Addendum)
On this type of insulin schedule, you should eat meals on a regular schedule.  If a meal is missed or significantly delayed, your blood sugar could go low again.   At breakfast, take 90 units of R.  At bedtime, take 10 units of N.  check your blood sugar twice a day.  vary the time of day when you check, between before the 3 meals, and at bedtime.  also check if you have symptoms of your blood sugar being too high or too low.  please keep a record of the readings and bring it to your next appointment here.  You can write it on any piece of paper.  please call us sooner if your blood sugar goes below 70, or if you have a lot of readings over 200.  Please see specialists for the fingers, and for the acne.  you will receive a phone call, about days and times for appointments.   Please come back for a regular physical appointment in 2 months.

## 2016-09-26 NOTE — Progress Notes (Signed)
Subjective:    Patient ID: CAMMY SANJURJO, female    DOB: [birth year], 71 y.o.   MRN: [id number]  HPI Pt returns for f/u of diabetes mellitus: DM type: Insulin-requiring type 2 Dx'ed: 1443 Complications: retinopathy and nephropathy.  Therapy: insulin since 1999 GDM: never.  DKA: never.   Severe hypoglycemia: once, in mid-2017.   Pancreatitis: never.  Other: she declines multiple daily injections.  She takes she takes human insulin, due to cost.  She takes R and N, both QAM; the pattern of her cbg's indicates she needs most of her daily insulin in the early hrs of the day    Interval history: no cbg record, but states cbg's vary from 49-330.  It is lowest at 1 AM, and highest fasting (she feels this is due to eating at HS, to prevent nocturnal hypoglycemia).   It si mildly low in the afternoon, only if a meal is missed.  She says she never misses the insulin.  pt states she feels well in general.  She takes QAM, 80 units of reg and 20 units of NPH.   She has several fingers "hanging up."   Past Medical History:  Diagnosis Date  . Complication of anesthesia    states was hard to wake up after 1st EGD  . Depression   . GERD (gastroesophageal reflux disease)   . High cholesterol   . History of seizure    x 1 - due to hyperglycemia  . Hypertension    states under control with med., has been on med. > 20 yr.  . Insulin dependent diabetes mellitus ([place])   . Macular degeneration   . Nocturia   . Osteoarthritis    shoulder  . Osteoporosis   . Popping of temporomandibular joint on opening of jaw   . Sebaceous cyst 09/2014   chest wall - sternum  . Toenail fungus     Past Surgical History:  Procedure Laterality Date  . ABDOMINAL HYSTERECTOMY     partial  . BLADDER SUSPENSION    . CATARACT EXTRACTION W/ INTRAOCULAR LENS  IMPLANT, BILATERAL Bilateral   . CESAREAN SECTION    . ESOPHAGOGASTRODUODENOSCOPY  05/07/2004  . ESOPHAGOGASTRODUODENOSCOPY (EGD) WITH PROPOFOL  07/06/2013  .  LOWER EXTREMITY ANGIOGRAM Bilateral 04/12/2015   Procedure: Lower Extremity Angiogram;  Surgeon: Conrad Marquez, MD;  Location: [facility];  Service: Cardiovascular;  Laterality: Bilateral;  . MASS EXCISION N/A 10/17/2014   Procedure: EXCISION 2X3CM SUBCUTANEOUS CHEST WALL CYST;  Surgeon: Rolm Bookbinder, MD;  Location: [facility];  Service: General;  Laterality: N/A;  . ORIF ANKLE FRACTURE Left 07/23/2012   Procedure: OPEN REDUCTION INTERNAL FIXATION (ORIF) ANKLE FRACTURE;  Surgeon: Jessy Oto, MD;  Location: [facility];  Service: Orthopedics;  Laterality: Left;  . ORIF WRIST FRACTURE Left   . PERIPHERAL VASCULAR CATHETERIZATION N/A 04/12/2015   Procedure: Abdominal Aortogram;  Surgeon: Conrad Herman, MD;  Location: [facility];  Service: Cardiovascular;  Laterality: N/A;  . PERIPHERAL VASCULAR CATHETERIZATION Right 04/12/2015   Procedure: Peripheral Vascular Balloon Angioplasty;  Surgeon: Conrad [place], MD;  Location: [facility];  Service: Cardiovascular;  Laterality: Right;  rt sfa drug coated balloon    Social History   Social History  . Marital status: Widowed    Spouse name: N/A  . Number of children: 3  . Years of education: N/A   Occupational History  . 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 Smoking status: Former Smoker    Packs/day: 0.00    Quit date: 11/11/2011  . Smokeless tobacco: Never Used  . Alcohol use No  . Drug use: No  . Sexual activity: Not on file   Other Topics Concern  . Not on file   Social History Narrative   Widowed   Daily Caffeine Use-2 cups daily or more   Pt does not get regular exercise   Works [employer], 3rd shift    Current Outpatient Prescriptions on File Prior to Visit  Medication Sig Dispense Refill  . Blood Glucose Monitoring Suppl (TRUE METRIX AIR GLUCOSE METER) W/DEVICE KIT 1 Device by Other route once. 1 kit 0  . calcium carbonate (OS-CAL) 600 MG  TABS Take 1,200 mg by mouth daily.     . INSULIN SYRINGE 1CC/29G 29G X 1/2" 1 ML MISC by Does not apply route.    Marland Kitchen lisinopril (PRINIVIL,ZESTRIL) 10 MG tablet Take 1 tablet (10 mg total) by mouth daily. 90 tablet 3  . omeprazole (PRILOSEC) 40 MG capsule TAKE 1 CAPSULE TWICE DAILY 180 capsule 0  . ranitidine (ZANTAC) 300 MG tablet Take 1 tablet (300 mg total) by mouth at bedtime. 90 tablet 3  . simvastatin (ZOCOR) 80 MG tablet Take 0.5 tablets (40 mg total) by mouth at bedtime. 45 tablet 3  . TRUE METRIX BLOOD GLUCOSE TEST test strip TEST TWO TIMES DAILY 200 each 3  . TRUEPLUS LANCETS 28G MISC TEST TWO TIMES DAILY 200 each 3  . venlafaxine XR (EFFEXOR-XR) 75 MG 24 hr capsule TAKE 1 CAPSULE DAILY WITH BREAKFAST  90 capsule 1  . vitamin D, CHOLECALCIFEROL, 400 UNITS tablet Take 400 Units by mouth daily.    . vitamin E (VITAMIN E) 400 UNIT capsule Take 400 Units by mouth daily.     No current facility-administered medications on file prior to visit.     Allergies  Allergen Reactions  . Procaine Other (See Comments)    Heart race  . Adhesive [Tape] Rash and Other (See Comments)    SKIN IRRITATION    Family History  Problem Relation Age of Onset  . Ovarian cancer Mother   . Stroke Father   . Diabetes Sister   . Stroke Sister   . Heart disease Sister        before age 72  . Diabetes Brother   . Heart disease Brother        before age 14  . Diabetes Sister     BP 126/62   Pulse 95   Wt 177 lb 12.8 oz (80.6 kg)   SpO2 98%   BMI 33.60 kg/m    Review of Systems She denies LOC.      Objective:   Physical Exam VITAL SIGNS:  See vs page GENERAL: no distress Hands: several trigger fingers Pulses: foot pulses are intact bilaterally.   MSK: no deformity of the feet or ankles.  CV: no edema of the legs or ankles Skin:  no ulcer on the feet or ankles.  normal color and temp on the feet and ankles Neuro: sensation is intact to touch on the feet and ankles.     Lab Results    Component Value Date   HGBA1C 9.8 09/26/2016      Assessment & Plan:  Insulin-requiring type 2 DM, with DR: Based on the pattern of her cbg's, she needs some adjustment in her therapy Trigger fingers, new  Patient  Instructions  On this type of insulin schedule, you should eat meals on a regular schedule.  If a meal is missed or significantly delayed, your blood sugar could go low again.   At breakfast, take 90 units of R.  At bedtime, take 10 units of N.  check your blood sugar twice a day.  vary the time of day when you check, between before the 3 meals, and at bedtime.  also check if you have symptoms of your blood sugar being too high or too low.  please keep a record of the readings and bring it to your next appointment here.  You can write it on any piece of paper.  please call us sooner if your blood sugar goes below 70, or if you have a lot of readings over 200.  Please see specialists for the fingers, and for the acne.  you will receive a phone call, about days and times for appointments.   Please come back for a regular physical appointment in 2 months.

## 2016-10-06 ENCOUNTER — Telehealth: Payer: Self-pay | Admitting: Endocrinology

## 2016-10-06 NOTE — Telephone Encounter (Signed)
Patient needs the nurse to fax [employer] at [phone] the rx for the insulin syringes and to cancel the order that was placed before. Call to advise.

## 2016-10-07 ENCOUNTER — Other Ambulatory Visit: Payer: Self-pay

## 2016-10-07 MED ORDER — "INSULIN SYRINGE 29G X 1/2"" 1 ML MISC": 2 refills | Status: DC

## 2016-10-07 NOTE — Telephone Encounter (Signed)
Called patient and sent in prescription 

## 2016-10-14 NOTE — Progress Notes (Signed)
[facility] [address], [place] [address] Phone: [phone] Subjective:    I'm seeing this patient by the request  of:    CC: Finger pain  KDT:[id number]  Yvonne Reynolds is a 71 y.o. female coming in with complaint of trigger finger.  Left ring finger, going on 1-2 months. No injury Patient has had increasing triggering on the left patient denies any numbness. States that is very painful. Seems to be worse in the morning.      Past Medical History:  Diagnosis Date  . Complication of anesthesia    states was hard to wake up after 1st EGD  . Depression   . GERD (gastroesophageal reflux disease)   . High cholesterol   . History of seizure    x 1 - due to hyperglycemia  . Hypertension    states under control with med., has been on med. > 20 yr.  . Insulin dependent diabetes mellitus ([place])   . Macular degeneration   . Nocturia   . Osteoarthritis    shoulder  . Osteoporosis   . Popping of temporomandibular joint on opening of jaw   . Sebaceous cyst 09/2014   chest wall - sternum  . Toenail fungus    Past Surgical History:  Procedure Laterality Date  . ABDOMINAL HYSTERECTOMY     partial  . BLADDER SUSPENSION    . CATARACT EXTRACTION W/ INTRAOCULAR LENS  IMPLANT, BILATERAL Bilateral   . CESAREAN SECTION    . ESOPHAGOGASTRODUODENOSCOPY  05/07/2004  . ESOPHAGOGASTRODUODENOSCOPY (EGD) WITH PROPOFOL  07/06/2013  . LOWER EXTREMITY ANGIOGRAM Bilateral 04/12/2015   Procedure: Lower Extremity Angiogram;  Surgeon: Conrad Rockport, MD;  Location: [facility];  Service: Cardiovascular;  Laterality: Bilateral;  . MASS EXCISION N/A 10/17/2014   Procedure: EXCISION 2X3CM SUBCUTANEOUS CHEST WALL CYST;  Surgeon: Rolm Bookbinder, MD;  Location: [facility];  Service: General;  Laterality: N/A;  . ORIF ANKLE FRACTURE Left 07/23/2012   Procedure: OPEN REDUCTION INTERNAL FIXATION (ORIF) ANKLE FRACTURE;  Surgeon: Jessy Oto, MD;   Location: [facility];  Service: Orthopedics;  Laterality: Left;  . ORIF WRIST FRACTURE Left   . PERIPHERAL VASCULAR CATHETERIZATION N/A 04/12/2015   Procedure: Abdominal Aortogram;  Surgeon: Conrad Woxall, MD;  Location: [facility];  Service: Cardiovascular;  Laterality: N/A;  . PERIPHERAL VASCULAR CATHETERIZATION Right 04/12/2015   Procedure: Peripheral Vascular Balloon Angioplasty;  Surgeon: Conrad Scotia, MD;  Location: [facility];  Service: Cardiovascular;  Laterality: Right;  rt sfa drug coated balloon   Social History   Social History  . Marital status: Widowed    Spouse name: N/A  . Number of children: 3  . Years of education: N/A   Occupational History  . 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 Smoking status: Former Smoker    Packs/day: 0.00    Quit date: 11/11/2011  . Smokeless tobacco: Never Used  . Alcohol use No  . Drug use: No  . Sexual activity: Not Asked   Other Topics Concern  . None   Social History Narrative   Widowed   Daily Caffeine Use-2 cups daily or more   Pt does not get regular exercise   Works [employer], 3rd shift   Allergies  Allergen Reactions  . Procaine Other (See Comments)    Heart race  . Adhesive [Tape] Rash and  Other (See Comments)    SKIN IRRITATION   Family History  Problem Relation Age of Onset  . Ovarian cancer Mother   . Stroke Father   . Diabetes Sister   . Stroke Sister   . Heart disease Sister        before age 49  . Diabetes Brother   . Heart disease Brother        before age 58  . Diabetes Sister      Past medical history, social, surgical and family history all reviewed in electronic medical record.  No pertanent information unless stated regarding to the chief complaint.   Review of Systems:Review of systems updated and as accurate as of 10/15/16  No headache, visual changes, nausea, vomiting, diarrhea, constipation, dizziness, abdominal pain, skin  rash, fevers, chills, night sweats, weight loss, swollen lymph nodes, body aches, joint swelling, muscle aches, chest pain, shortness of breath, mood changes.   Objective  Blood pressure 132/64, pulse (!) 104, height 5\' 1"  (1.549 m), weight 177 lb (80.3 kg), SpO2 96 %. Systems examined below as of 10/15/16   General: No apparent distress alert and oriented x3 mood and affect normal, dressed appropriately.  HEENT: Pupils equal, extraocular movements intact  Respiratory: Patient's speak in full sentences and does not appear short of breath  Cardiovascular: No lower extremity edema, non tender, no erythema  Skin: Warm dry intact with no signs of infection or rash on extremities or on axial skeleton.  Abdomen: Soft nontender  Neuro: Cranial nerves II through XII are intact, neurovascularly intact in all extremities with 2+ DTRs and 2+ pulses.  Lymph: No lymphadenopathy of posterior or anterior cervical chain or axillae bilaterally.  Gait normal with good balance and coordination.  MSK:  Non tender with full range of motion and good stability and symmetric strength and tone of shoulders, elbows, wrist, hip, knee and ankles bilaterally.  Hand exam shows the left side shows the patient does have a triggering at the A1 pulley of the ring finger. Tender to palpation.   Procedure: Real-time Ultrasound Guided Injection of left flexor tendon sheath at the A1 pulley Device: GE Logiq Q7 Ultrasound guided injection is preferred based studies that show increased duration, increased effect, greater accuracy, decreased procedural pain, increased response rate, and decreased cost with ultrasound guided versus blind injection.  Verbal informed consent obtained.  Time-out conducted.  Noted no overlying erythema, induration, or other signs of local infection.  Skin prepped in a sterile fashion.  Local anesthesia: Topical Ethyl chloride.  With sterile technique and under real time ultrasound guidance: The  25-gauge half-inch needle patient was injected with a total of 0.5 mL of 0.5% Marcaine and 0.5 mL of Kenalog 40 mg/dL Completed without difficulty  Pain immediately resolved suggesting accurate placement of the medication.  Advised to call if fevers/chills, erythema, induration, drainage, or persistent bleeding.  Images permanently stored and available for review in the ultrasound unit.  Impression: Technically successful ultrasound guided injection.   Impression and Recommendations:     This case required medical decision making of moderate complexity.      Note: This dictation was prepared with Dragon dictation along with smaller phrase technology. Any transcriptional errors that result from this process are unintentional.

## 2016-10-15 ENCOUNTER — Encounter: Payer: Self-pay | Admitting: Family Medicine

## 2016-10-15 ENCOUNTER — Ambulatory Visit: Payer: Self-pay

## 2016-10-15 ENCOUNTER — Ambulatory Visit (INDEPENDENT_AMBULATORY_CARE_PROVIDER_SITE_OTHER): Payer: Medicare HMO | Admitting: Family Medicine

## 2016-10-15 VITALS — BP 132/64 | HR 104 | Ht 61.0 in | Wt 177.0 lb

## 2016-10-15 DIAGNOSIS — M79646 Pain in unspecified finger(s): Secondary | ICD-10-CM

## 2016-10-15 DIAGNOSIS — M6530 Trigger finger, unspecified finger: Secondary | ICD-10-CM | POA: Diagnosis not present

## 2016-10-15 NOTE — Patient Instructions (Signed)
Good to see you  Gave you an injection today and should help  Ice may be a good idea in 6 hours.  Try to wear brace at night for next week.  Pain should be a lot better but will trigger a little more for a couple days  Can repeat every 3 months if needed but hopefully will last longer.  I would just call if you need me.

## 2016-10-15 NOTE — Assessment & Plan Note (Signed)
Patient given injection today and tolerated the procedure well. We discussed bracing at night for short course of time. We discussed the prognosis of this and likely the reoccurrence. Patient knows that can come back for any other concerns. We discussed the possibility of returning in 3-4 weeks.

## 2016-11-25 ENCOUNTER — Ambulatory Visit (INDEPENDENT_AMBULATORY_CARE_PROVIDER_SITE_OTHER): Payer: Medicare HMO | Admitting: Endocrinology

## 2016-11-25 ENCOUNTER — Encounter: Payer: Self-pay | Admitting: Endocrinology

## 2016-11-25 VITALS — BP 130/70 | HR 96 | Wt 181.0 lb

## 2016-11-25 DIAGNOSIS — Z794 Long term (current) use of insulin: Secondary | ICD-10-CM

## 2016-11-25 DIAGNOSIS — D509 Iron deficiency anemia, unspecified: Secondary | ICD-10-CM

## 2016-11-25 DIAGNOSIS — E1151 Type 2 diabetes mellitus with diabetic peripheral angiopathy without gangrene: Secondary | ICD-10-CM

## 2016-11-25 DIAGNOSIS — Z0000 Encounter for general adult medical examination without abnormal findings: Secondary | ICD-10-CM

## 2016-11-25 DIAGNOSIS — M810 Age-related osteoporosis without current pathological fracture: Secondary | ICD-10-CM

## 2016-11-25 LAB — CBC WITH DIFFERENTIAL/PLATELET
BASOS ABS: 0.1 10*3/uL (ref 0.0–0.1)
BASOS PCT: 1.3 % (ref 0.0–3.0)
EOS ABS: 0.1 10*3/uL (ref 0.0–0.7)
Eosinophils Relative: 0.7 % (ref 0.0–5.0)
HEMATOCRIT: 40.5 % (ref 36.0–46.0)
HEMOGLOBIN: 12.8 g/dL (ref 12.0–15.0)
LYMPHS PCT: 22.9 % (ref 12.0–46.0)
Lymphs Abs: 2.4 10*3/uL (ref 0.7–4.0)
MCHC: 31.7 g/dL (ref 30.0–36.0)
MCV: 87.8 fl (ref 78.0–100.0)
MONOS PCT: 6.9 % (ref 3.0–12.0)
Monocytes Absolute: 0.7 10*3/uL (ref 0.1–1.0)
Neutro Abs: 7.2 10*3/uL (ref 1.4–7.7)
Neutrophils Relative %: 68.2 % (ref 43.0–77.0)
PLATELETS: 332 10*3/uL (ref 150.0–400.0)
RBC: 4.61 Mil/uL (ref 3.87–5.11)
RDW: 14.3 % (ref 11.5–15.5)
WBC: 10.6 10*3/uL — AB (ref 4.0–10.5)

## 2016-11-25 LAB — LIPID PANEL
CHOL/HDL RATIO: 4
Cholesterol: 203 mg/dL — ABNORMAL HIGH (ref 0–200)
HDL: 46.4 mg/dL (ref >39.00)
LDL CALC: 116 mg/dL — AB (ref 0–99)
NONHDL: 156.1
TRIGLYCERIDES: 200 mg/dL — AB (ref 0.0–149.0)
VLDL: 40 mg/dL (ref 0.0–40.0)

## 2016-11-25 LAB — HEPATIC FUNCTION PANEL
ALT: 16 U/L (ref 0–35)
AST: 19 U/L (ref 0–37)
Albumin: 3.8 g/dL (ref 3.5–5.2)
Alkaline Phosphatase: 91 U/L (ref 39–117)
BILIRUBIN DIRECT: 0.1 mg/dL (ref 0.0–0.3)
BILIRUBIN TOTAL: 0.3 mg/dL (ref 0.2–1.2)
Total Protein: 7 g/dL (ref 6.0–8.3)

## 2016-11-25 LAB — TSH: TSH: 2.98 u[IU]/mL (ref 0.35–4.50)

## 2016-11-25 LAB — VITAMIN D 25 HYDROXY (VIT D DEFICIENCY, FRACTURES): VITD: 23.81 ng/mL — ABNORMAL LOW (ref 30.00–100.00)

## 2016-11-25 LAB — IBC PANEL
IRON: 104 ug/dL (ref 42–145)
SATURATION RATIOS: 21.4 % (ref 20.0–50.0)
TRANSFERRIN: 347 mg/dL (ref 212.0–360.0)

## 2016-11-25 LAB — BASIC METABOLIC PANEL
BUN: 20 mg/dL (ref 6–23)
CALCIUM: 9.1 mg/dL (ref 8.4–10.5)
CHLORIDE: 101 meq/L (ref 96–112)
CO2: 28 mEq/L (ref 19–32)
CREATININE: 0.87 mg/dL (ref 0.40–1.20)
GFR: 68.5 mL/min (ref >60.00)
Glucose, Bld: 296 mg/dL — ABNORMAL HIGH (ref 70–99)
Potassium: 4.7 mEq/L (ref 3.5–5.1)
Sodium: 137 mEq/L (ref 135–145)

## 2016-11-25 LAB — MICROALBUMIN / CREATININE URINE RATIO
Creatinine,U: 97.1 mg/dL
Microalb Creat Ratio: 57.1 mg/g — ABNORMAL HIGH (ref 0.0–30.0)
Microalb, Ur: 55.4 mg/dL — ABNORMAL HIGH (ref 0.0–1.9)

## 2016-11-25 LAB — POCT GLYCOSYLATED HEMOGLOBIN (HGB A1C): Hemoglobin A1C: 8.7

## 2016-11-25 MED ORDER — VENLAFAXINE HCL ER 150 MG PO CP24: 150.0000 mg | ORAL_CAPSULE | Freq: Every day | ORAL | 11 refills | Status: DC

## 2016-11-25 MED ORDER — ROSUVASTATIN CALCIUM 10 MG PO TABS: 10.0000 mg | ORAL_TABLET | Freq: Every day | ORAL | 3 refills | Status: DC

## 2016-11-25 MED ORDER — INSULIN REGULAR HUMAN 100 UNIT/ML IJ SOLN: 100.0000 [IU] | Freq: Every day | INTRAMUSCULAR | 11 refills | Status: DC

## 2016-11-25 NOTE — Patient Instructions (Addendum)
On this type of insulin schedule, you should eat meals on a regular schedule.  If a meal is missed or significantly delayed, your blood sugar could go low again.   At breakfast, take 100 units of R.  Please stop taking the N insulin.   check your blood sugar twice a day.  vary the time of day when you check, between before the 3 meals, and at bedtime.  also check if you have symptoms of your blood sugar being too high or too low.  please keep a record of the readings and bring it to your next appointment here.  You can write it on any piece of paper.  please call us sooner if your blood sugar goes below 70, or if you have a lot of readings over 200.  Please see an eye specialist.  you will receive a phone call, about a day and time for an appointment.  I have sent a prescription to your pharmacy, to double the pill against depression.  blood tests are requested for you today.  We'll let you know about the results. Please come back for a regular physical appointment in 3 months.

## 2016-11-25 NOTE — Progress Notes (Signed)
Subjective:    Patient ID: Yvonne Reynolds, female    DOB: 07-14-1945, 71 y.o.   MRN: [id number]  HPI Pt returns for f/u of diabetes mellitus: DM type: Insulin-requiring type 2 Dx'ed: 4193 Complications: retinopathy and nephropathy.   Therapy: insulin since 1999 GDM: never.  DKA: never.   Severe hypoglycemia: once, in mid-2017.   Pancreatitis: never.  Other: she declines multiple daily injections.  She takes she takes human insulin, due to cost.  She takes R and N, both QAM; the pattern of her cbg's indicates she needs most of her daily insulin in the early hrs of the day    Interval history: no cbg record, but states cbg's are often low in the middle of the night.  It is highest during the day.   She says she was found to have partially det retina, and requests different opthalmologist.   Depression persists.  She denies SI.   Past Medical History:  Diagnosis Date  . Complication of anesthesia    states was hard to wake up after 1st EGD  . Depression   . GERD (gastroesophageal reflux disease)   . High cholesterol   . History of seizure    x 1 - due to hyperglycemia  . Hypertension    states under control with med., has been on med. > 20 yr.  . Insulin dependent diabetes mellitus ([place])   . Macular degeneration   . Nocturia   . Osteoarthritis    shoulder  . Osteoporosis   . Popping of temporomandibular joint on opening of jaw   . Sebaceous cyst 09/2014   chest wall - sternum  . Toenail fungus     Past Surgical History:  Procedure Laterality Date  . ABDOMINAL HYSTERECTOMY     partial  . BLADDER SUSPENSION    . CATARACT EXTRACTION W/ INTRAOCULAR LENS  IMPLANT, BILATERAL Bilateral   . CESAREAN SECTION    . ESOPHAGOGASTRODUODENOSCOPY  05/07/2004  . ESOPHAGOGASTRODUODENOSCOPY (EGD) WITH PROPOFOL  07/06/2013  . LOWER EXTREMITY ANGIOGRAM Bilateral 04/12/2015   Procedure: Lower Extremity Angiogram;  Surgeon: Conrad Smackover, MD;  Location: [facility];  Service:  Cardiovascular;  Laterality: Bilateral;  . MASS EXCISION N/A 10/17/2014   Procedure: EXCISION 2X3CM SUBCUTANEOUS CHEST WALL CYST;  Surgeon: Rolm Bookbinder, MD;  Location: [facility];  Service: General;  Laterality: N/A;  . ORIF ANKLE FRACTURE Left 07/23/2012   Procedure: OPEN REDUCTION INTERNAL FIXATION (ORIF) ANKLE FRACTURE;  Surgeon: Jessy Oto, MD;  Location: [facility];  Service: Orthopedics;  Laterality: Left;  . ORIF WRIST FRACTURE Left   . PERIPHERAL VASCULAR CATHETERIZATION N/A 04/12/2015   Procedure: Abdominal Aortogram;  Surgeon: Conrad Galena, MD;  Location: [facility];  Service: Cardiovascular;  Laterality: N/A;  . PERIPHERAL VASCULAR CATHETERIZATION Right 04/12/2015   Procedure: Peripheral Vascular Balloon Angioplasty;  Surgeon: Conrad , MD;  Location: [facility];  Service: Cardiovascular;  Laterality: Right;  rt sfa drug coated balloon    Social History   Social History  . Marital status: Widowed    Spouse name: N/A  . Number of children: 3  . Years of education: N/A   Occupational History  . 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 Smoking status: Former Smoker    Packs/day: 0.00    Quit date: 11/11/2011  . Smokeless tobacco: Never Used  . Alcohol use No  .  Drug use: No  . Sexual activity: Not on file   Other Topics Concern  . Not on file   Social History Narrative   Widowed   Daily Caffeine Use-2 cups daily or more   Pt does not get regular exercise   Works [employer], 3rd shift    Current Outpatient Prescriptions on File Prior to Visit  Medication Sig Dispense Refill  . Blood Glucose Monitoring Suppl (TRUE METRIX AIR GLUCOSE METER) W/DEVICE KIT 1 Device by Other route once. 1 kit 0  . calcium carbonate (OS-CAL) 600 MG TABS Take 1,200 mg by mouth daily.     . INSULIN SYRINGE 1CC/29G 29G X 1/2" 1 ML MISC Used to inject insulin into skin subcutaneously 2x daily. 200 each 2    . lisinopril (PRINIVIL,ZESTRIL) 10 MG tablet Take 1 tablet (10 mg total) by mouth daily. 90 tablet 3  . omeprazole (PRILOSEC) 40 MG capsule TAKE 1 CAPSULE TWICE DAILY 180 capsule 0  . ranitidine (ZANTAC) 300 MG tablet Take 1 tablet (300 mg total) by mouth at bedtime. 90 tablet 3  . TRUE METRIX BLOOD GLUCOSE TEST test strip TEST TWO TIMES DAILY 200 each 3  . TRUEPLUS LANCETS 28G MISC TEST TWO TIMES DAILY 200 each 3  . vitamin D, CHOLECALCIFEROL, 400 UNITS tablet Take 400 Units by mouth daily.    . vitamin E (VITAMIN E) 400 UNIT capsule Take 400 Units by mouth daily.     No current facility-administered medications on file prior to visit.     Allergies  Allergen Reactions  . Procaine Other (See Comments)    Heart race  . Adhesive [Tape] Rash and Other (See Comments)    SKIN IRRITATION    Family History  Problem Relation Age of Onset  . Ovarian cancer Mother   . Stroke Father   . Diabetes Sister   . Stroke Sister   . Heart disease Sister        before age 43  . Diabetes Brother   . Heart disease Brother        before age 63  . Diabetes Sister     BP 130/70   Pulse 96   Wt 181 lb (82.1 kg)   SpO2 96%   BMI 34.20 kg/m   Review of Systems Denies LOC    Objective:   Physical Exam VITAL SIGNS:  See vs page GENERAL: no distress Pulses: dorsalis pedis intact bilat.   MSK: no deformity of the feet CV: trace bilat leg edema Skin:  no ulcer on the feet.  normal color and temp on the feet. Neuro: sensation is intact to touch on the feet.    ecg machine is not working today    Assessment & Plan:  Depression: persistent: she declines ref to psychologist.  I have sent a prescription to your pharmacy, to double the effexor.   Detached retina, new to me.  Ref opthal Insulin-requiring type 2 DM, with DR: Based on the pattern of her cbg's, she needs some adjustment in her therapy At breakfast, take 100 units of R.  Please stop taking the N insulin   Subjective:   Patient  here for Medicare annual wellness visit and management of other chronic and acute problems.     Risk factors: advanced age    40 of Physicians Providing Medical Care to Patient:  See "snapshot"   Activities of Daily Living: In your present state of health, do you have any difficulty performing the following activities? (lives  alone; grandson is NOK--lives nearby)  Preparing food and eating?: No  Bathing yourself: No  Getting dressed: No  Using the toilet: No  Moving around from place to place: No  In the past year have you fallen or had a near fall?: No    Home Safety: Has smoke detector and wears seat belts. No firearms. No excess sun exposure.    Opioid Use: none   Diet and Exercise  Current exercise habits: pt says limited by health problems Dietary issues discussed: pt reports a healthy diet   Depression Screen  Q1: Over the past two weeks, have you felt down, depressed or hopeless? no  Q2: Over the past two weeks, have you felt little interest or pleasure in doing things? no   The following portions of the patient's history were reviewed and updated as appropriate: allergies, current medications, past family history, past medical history, past social history, past surgical history and problem list.   Review of Systems  Denies hearing loss, and visual loss Objective:   Vision:  Advertising account executive, so he declines VA today.   Hearing: grossly normal Body mass index:  See vs page Msk: pt easily and quickly performs "get-up-and-go" from a sitting position.  Cognitive Impairment Assessment: cognition, memory and judgment appear normal.  remembers 3/3 at 5 minutes.  excellent recall.  can easily read and write a sentence.  alert and oriented x 3.    Assessment:   Medicare wellness utd on preventive parameters    Plan:   During the course of the visit the patient was educated and counseled about appropriate screening and preventive services including:        Fall  prevention is advised today  Screening mammography  Bone densitometry screening  Diabetes screening  Nutrition counseling is offered   Vaccines are updated as needed  Patient Instructions (the written plan) was given to the patient.

## 2016-11-27 LAB — PTH, INTACT AND CALCIUM
CALCIUM: 9.1 mg/dL (ref 8.6–10.4)
PTH: 29 pg/mL (ref 14–64)

## 2016-12-03 DIAGNOSIS — H4311 Vitreous hemorrhage, right eye: Secondary | ICD-10-CM | POA: Diagnosis not present

## 2016-12-03 DIAGNOSIS — E113513 Type 2 diabetes mellitus with proliferative diabetic retinopathy with macular edema, bilateral: Secondary | ICD-10-CM | POA: Diagnosis not present

## 2016-12-03 DIAGNOSIS — H35371 Puckering of macula, right eye: Secondary | ICD-10-CM | POA: Diagnosis not present

## 2016-12-03 DIAGNOSIS — H4312 Vitreous hemorrhage, left eye: Secondary | ICD-10-CM | POA: Diagnosis not present

## 2016-12-03 DIAGNOSIS — E113511 Type 2 diabetes mellitus with proliferative diabetic retinopathy with macular edema, right eye: Secondary | ICD-10-CM | POA: Diagnosis not present

## 2016-12-03 DIAGNOSIS — H4313 Vitreous hemorrhage, bilateral: Secondary | ICD-10-CM | POA: Diagnosis not present

## 2016-12-03 DIAGNOSIS — E113512 Type 2 diabetes mellitus with proliferative diabetic retinopathy with macular edema, left eye: Secondary | ICD-10-CM | POA: Diagnosis not present

## 2016-12-08 DIAGNOSIS — E113512 Type 2 diabetes mellitus with proliferative diabetic retinopathy with macular edema, left eye: Secondary | ICD-10-CM | POA: Diagnosis not present

## 2016-12-08 DIAGNOSIS — E113511 Type 2 diabetes mellitus with proliferative diabetic retinopathy with macular edema, right eye: Secondary | ICD-10-CM | POA: Diagnosis not present

## 2017-01-06 ENCOUNTER — Encounter (HOSPITAL_COMMUNITY): Payer: Medicare HMO

## 2017-01-06 ENCOUNTER — Ambulatory Visit: Payer: Medicare HMO | Admitting: Family

## 2017-01-14 DIAGNOSIS — H4311 Vitreous hemorrhage, right eye: Secondary | ICD-10-CM | POA: Diagnosis not present

## 2017-01-14 DIAGNOSIS — E113511 Type 2 diabetes mellitus with proliferative diabetic retinopathy with macular edema, right eye: Secondary | ICD-10-CM | POA: Diagnosis not present

## 2017-01-14 DIAGNOSIS — E113513 Type 2 diabetes mellitus with proliferative diabetic retinopathy with macular edema, bilateral: Secondary | ICD-10-CM | POA: Diagnosis not present

## 2017-01-14 DIAGNOSIS — H4313 Vitreous hemorrhage, bilateral: Secondary | ICD-10-CM | POA: Diagnosis not present

## 2017-01-14 DIAGNOSIS — H4312 Vitreous hemorrhage, left eye: Secondary | ICD-10-CM | POA: Diagnosis not present

## 2017-01-14 DIAGNOSIS — E113512 Type 2 diabetes mellitus with proliferative diabetic retinopathy with macular edema, left eye: Secondary | ICD-10-CM | POA: Diagnosis not present

## 2017-02-04 ENCOUNTER — Other Ambulatory Visit: Payer: Self-pay | Admitting: Endocrinology

## 2017-02-20 DIAGNOSIS — E113511 Type 2 diabetes mellitus with proliferative diabetic retinopathy with macular edema, right eye: Secondary | ICD-10-CM | POA: Diagnosis not present

## 2017-02-23 ENCOUNTER — Ambulatory Visit: Payer: Medicare HMO | Admitting: Endocrinology

## 2017-02-23 ENCOUNTER — Ambulatory Visit
Admission: RE | Admit: 2017-02-23 | Discharge: 2017-02-23 | Disposition: A | Discharge status: Home / Self Care | Payer: Medicare HMO | Source: Ambulatory Visit | Attending: Endocrinology | Admitting: Endocrinology

## 2017-02-23 ENCOUNTER — Encounter: Payer: Self-pay | Admitting: Endocrinology

## 2017-02-23 ENCOUNTER — Telehealth: Payer: Self-pay | Admitting: Endocrinology

## 2017-02-23 VITALS — BP 142/72 | HR 94 | Wt 180.8 lb

## 2017-02-23 DIAGNOSIS — Z794 Long term (current) use of insulin: Secondary | ICD-10-CM | POA: Diagnosis not present

## 2017-02-23 DIAGNOSIS — S20211A Contusion of right front wall of thorax, initial encounter: Secondary | ICD-10-CM

## 2017-02-23 DIAGNOSIS — S5011XA Contusion of right forearm, initial encounter: Secondary | ICD-10-CM

## 2017-02-23 DIAGNOSIS — S20219A Contusion of unspecified front wall of thorax, initial encounter: Secondary | ICD-10-CM | POA: Insufficient documentation

## 2017-02-23 DIAGNOSIS — E1151 Type 2 diabetes mellitus with diabetic peripheral angiopathy without gangrene: Secondary | ICD-10-CM

## 2017-02-23 DIAGNOSIS — S60221A Contusion of right hand, initial encounter: Secondary | ICD-10-CM

## 2017-02-23 DIAGNOSIS — R05 Cough: Secondary | ICD-10-CM | POA: Diagnosis not present

## 2017-02-23 DIAGNOSIS — M7981 Nontraumatic hematoma of soft tissue: Secondary | ICD-10-CM | POA: Diagnosis not present

## 2017-02-23 LAB — POCT GLYCOSYLATED HEMOGLOBIN (HGB A1C): Hemoglobin A1C: 9.4

## 2017-02-23 NOTE — Progress Notes (Signed)
Subjective:    Patient ID: Yvonne Reynolds, female    DOB: Nov 18, 1945, 72 y.o.   MRN: [id number]  HPI Pt returns for f/u of diabetes mellitus: DM type: Insulin-requiring type 2 Dx'ed: 8127 Complications: retinopathy and nephropathy.   Therapy: insulin since 1999 GDM: never.  DKA: never.   Severe hypoglycemia: once, in mid-2017.   Pancreatitis: never.  Other: she declines multiple daily injections.  She takes she takes human insulin, due to cost.  She takes R and N, both QAM; the pattern of her cbg's indicates she needs most of her daily insulin in the early hrs of the day    Interval history: no cbg record, but states cbg's are still often low in the middle of the night.  It is highest during the day.   Pt fell at home 5 days days ago, at home.  Since then, she has moderate pain at the right wrist, and assoc pain at the right ringer.   Past Medical History:  Diagnosis Date  . Complication of anesthesia    states was hard to wake up after 1st EGD  . Depression   . GERD (gastroesophageal reflux disease)   . High cholesterol   . History of seizure    x 1 - due to hyperglycemia  . Hypertension    states under control with med., has been on med. > 20 yr.  . Insulin dependent diabetes mellitus ([place])   . Macular degeneration   . Nocturia   . Osteoarthritis    shoulder  . Osteoporosis   . Popping of temporomandibular joint on opening of jaw   . Sebaceous cyst 09/2014   chest wall - sternum  . Toenail fungus     Past Surgical History:  Procedure Laterality Date  . ABDOMINAL HYSTERECTOMY     partial  . BLADDER SUSPENSION    . CATARACT EXTRACTION W/ INTRAOCULAR LENS  IMPLANT, BILATERAL Bilateral   . CESAREAN SECTION    . ESOPHAGOGASTRODUODENOSCOPY  05/07/2004  . ESOPHAGOGASTRODUODENOSCOPY (EGD) WITH PROPOFOL  07/06/2013  . LOWER EXTREMITY ANGIOGRAM Bilateral 04/12/2015   Procedure: Lower Extremity Angiogram;  Surgeon: Conrad Badger, MD;  Location: [facility];  Service:  Cardiovascular;  Laterality: Bilateral;  . MASS EXCISION N/A 10/17/2014   Procedure: EXCISION 2X3CM SUBCUTANEOUS CHEST WALL CYST;  Surgeon: Rolm Bookbinder, MD;  Location: [facility];  Service: General;  Laterality: N/A;  . ORIF ANKLE FRACTURE Left 07/23/2012   Procedure: OPEN REDUCTION INTERNAL FIXATION (ORIF) ANKLE FRACTURE;  Surgeon: Jessy Oto, MD;  Location: [facility];  Service: Orthopedics;  Laterality: Left;  . ORIF WRIST FRACTURE Left   . PERIPHERAL VASCULAR CATHETERIZATION N/A 04/12/2015   Procedure: Abdominal Aortogram;  Surgeon: Conrad [place] Market, MD;  Location: [facility];  Service: Cardiovascular;  Laterality: N/A;  . PERIPHERAL VASCULAR CATHETERIZATION Right 04/12/2015   Procedure: Peripheral Vascular Balloon Angioplasty;  Surgeon: Conrad [place] Hills, MD;  Location: [facility];  Service: Cardiovascular;  Laterality: Right;  rt sfa drug coated balloon    Social History   Socioeconomic History  . Marital status: Widowed    Spouse name: Not on file  . Number of children: 3  . Years of education: Not on file  . Highest education level: Not on file  Social Needs  . Financial resource strain: Not on file  . Food insecurity - worry: Not on file  . Food insecurity - inability: Not on file  . Transportation needs -  medical: Not on file  . Transportation needs - non-medical: Not on file  Occupational History  . 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 Smoking status: Former Smoker    Packs/day: 0.00    Last attempt to quit: 11/11/2011    Years since quitting: 5.2  . Smokeless tobacco: Never Used  Substance and Sexual Activity  . Alcohol use: No  . Drug use: No  . Sexual activity: Not on file  Other Topics Concern  . Not on file  Social History Narrative   Widowed   Daily Caffeine Use-2 cups daily or more   Pt does not get regular exercise   Works [employer], 3rd shift    Current  Outpatient Medications on File Prior to Visit  Medication Sig Dispense Refill  . Blood Glucose Monitoring Suppl (TRUE METRIX AIR GLUCOSE METER) W/DEVICE KIT 1 Device by Other route once. 1 kit 0  . calcium carbonate (OS-CAL) 600 MG TABS Take 1,200 mg by mouth daily.     . insulin regular (NOVOLIN R) 100 units/mL injection Inject 1 mL (100 Units total) into the skin daily with breakfast. 40 mL 11  . INSULIN SYRINGE 1CC/29G 29G X 1/2" 1 ML MISC Used to inject insulin into skin subcutaneously 2x daily. 200 each 2  . lisinopril (PRINIVIL,ZESTRIL) 10 MG tablet TAKE 1 TABLET EVERY DAY 90 tablet 3  . omeprazole (PRILOSEC) 40 MG capsule TAKE 1 CAPSULE TWICE DAILY 180 capsule 0  . ranitidine (ZANTAC) 300 MG tablet Take 1 tablet (300 mg total) by mouth at bedtime. 90 tablet 3  . rosuvastatin (CRESTOR) 10 MG tablet Take 1 tablet (10 mg total) by mouth daily. 90 tablet 3  . TRUE METRIX BLOOD GLUCOSE TEST test strip TEST TWO TIMES DAILY 200 each 3  . TRUEPLUS LANCETS 28G MISC TEST TWO TIMES DAILY 200 each 3  . venlafaxine XR (EFFEXOR-XR) 150 MG 24 hr capsule Take 1 capsule (150 mg total) by mouth daily with breakfast. 30 capsule 11  . vitamin D, CHOLECALCIFEROL, 400 UNITS tablet Take 2,000 Units by mouth daily.     . vitamin E (VITAMIN E) 400 UNIT capsule Take 400 Units by mouth daily.     No current facility-administered medications on file prior to visit.     Allergies  Allergen Reactions  . Procaine Other (See Comments)    Heart race  . Adhesive [Tape] Rash and Other (See Comments)    SKIN IRRITATION    Family History  Problem Relation Age of Onset  . Ovarian cancer Mother   . Stroke Father   . Diabetes Sister   . Stroke Sister   . Heart disease Sister        before age 70  . Diabetes Brother   . Heart disease Brother        before age 40  . Diabetes Sister     BP (!) 142/72 (BP Location: Left Arm, Patient Position: Sitting, Cuff Size: Normal)   Pulse 94   Wt 180 lb 12.8 oz (82 kg)    SpO2 95%   BMI 34.16 kg/m    Review of Systems She also has new pain at the right lat chest wall.  Denies LOC    Objective:   Physical Exam VITAL SIGNS:  See vs page GENERAL: no distress LUNGS:  Clear to auscultation.  Chest wall is nontender MSK: right ring finger DIP joint is swollen  and tender.  Right forearm has large ecchymosis, and tenderness Gait: normal and steady   Lab Results  Component Value Date   CREATININE 0.87 11/25/2016   BUN 20 11/25/2016   NA 137 11/25/2016   K 4.7 11/25/2016   CL 101 11/25/2016   CO2 28 11/25/2016      Assessment & Plan:  Contusions to the chest and RUE, new Insulin-requiring type 2 DM, with DR: she needs a faster-acting qd insulin, but she cannot afford analogs.   Patient Instructions  Please continue the same insulin Try warming the injection site before or after the injection, so it works faster. Pamala Duffel are requested for you today.  We'll let you know about the results. Please come back for a follow-up appointment in 2 months.  check your blood sugar twice a day.  vary the time of day when you check, between before the 3 meals, and at bedtime.  also check if you have symptoms of your blood sugar being too high or too low.  please keep a record of the readings and bring it to your next appointment here (or you can bring the meter itself).  You can write it on any piece of paper.  please call us sooner if your blood sugar goes below 70, or if you have a lot of readings over 200. It is critically important to prevent falling down (keep floor areas well-lit, dry, and free of loose objects.  If you have a cane, walker, or wheelchair, you should use it, even for short trips around the house.  Wear flat-soled shoes.  Also, try not to rush)

## 2017-02-23 NOTE — Patient Instructions (Addendum)
Please continue the same insulin Try warming the injection site before or after the injection, so it works faster. Yvonne Reynolds are requested for you today.  We'll let you know about the results. Please come back for a follow-up appointment in 2 months.  check your blood sugar twice a day.  vary the time of day when you check, between before the 3 meals, and at bedtime.  also check if you have symptoms of your blood sugar being too high or too low.  please keep a record of the readings and bring it to your next appointment here (or you can bring the meter itself).  You can write it on any piece of paper.  please call us sooner if your blood sugar goes below 70, or if you have a lot of readings over 200. It is critically important to prevent falling down (keep floor areas well-lit, dry, and free of loose objects.  If you have a cane, walker, or wheelchair, you should use it, even for short trips around the house.  Wear flat-soled shoes.  Also, try not to rush)

## 2017-02-23 NOTE — Telephone Encounter (Signed)
Ov 1:00 pm today.  We can do x-rays here.

## 2017-02-23 NOTE — Telephone Encounter (Signed)
I called and patient is coming in at 1pm.

## 2017-02-23 NOTE — Telephone Encounter (Signed)
Patient stated she fell last Wednesday and is in a lot of pain, need a referral to see a bone doctor. She fell she hurt her arm and think she broke her finger. Please advise

## 2017-02-24 NOTE — Telephone Encounter (Signed)
I called and left patient VM that nothing was broken. I stated she could call back with further questions.

## 2017-02-24 NOTE — Telephone Encounter (Signed)
Pt is calling to see if we have got any results back from the xrays she had done  Please advise.

## 2017-02-24 NOTE — Telephone Encounter (Signed)
Patient

## 2017-02-25 ENCOUNTER — Ambulatory Visit: Payer: Medicare HMO | Admitting: Endocrinology

## 2017-03-12 ENCOUNTER — Telehealth: Payer: Self-pay | Admitting: Endocrinology

## 2017-03-12 DIAGNOSIS — E113512 Type 2 diabetes mellitus with proliferative diabetic retinopathy with macular edema, left eye: Secondary | ICD-10-CM | POA: Diagnosis not present

## 2017-03-12 NOTE — Telephone Encounter (Signed)
I called patient & stated I wasn't sure who called her, but it wasn't me. There was no lab work that she needed at this time.

## 2017-03-12 NOTE — Telephone Encounter (Signed)
Pt is asking if she needs an A1C she states she thinks she has been called to make an appt for just this lab draw but I do not need an order or a note. Please advise

## 2017-03-19 ENCOUNTER — Other Ambulatory Visit: Payer: Self-pay | Admitting: Gastroenterology

## 2017-03-19 DIAGNOSIS — K2270 Barrett's esophagus without dysplasia: Secondary | ICD-10-CM

## 2017-03-19 DIAGNOSIS — K219 Gastro-esophageal reflux disease without esophagitis: Secondary | ICD-10-CM

## 2017-04-02 ENCOUNTER — Other Ambulatory Visit: Payer: Self-pay | Admitting: Gastroenterology

## 2017-04-02 DIAGNOSIS — K2270 Barrett's esophagus without dysplasia: Secondary | ICD-10-CM

## 2017-04-02 DIAGNOSIS — H4312 Vitreous hemorrhage, left eye: Secondary | ICD-10-CM | POA: Diagnosis not present

## 2017-04-02 DIAGNOSIS — E113513 Type 2 diabetes mellitus with proliferative diabetic retinopathy with macular edema, bilateral: Secondary | ICD-10-CM | POA: Diagnosis not present

## 2017-04-02 DIAGNOSIS — E113512 Type 2 diabetes mellitus with proliferative diabetic retinopathy with macular edema, left eye: Secondary | ICD-10-CM | POA: Diagnosis not present

## 2017-04-02 DIAGNOSIS — K219 Gastro-esophageal reflux disease without esophagitis: Secondary | ICD-10-CM

## 2017-04-07 ENCOUNTER — Telehealth: Payer: Self-pay | Admitting: Gastroenterology

## 2017-04-07 DIAGNOSIS — K2270 Barrett's esophagus without dysplasia: Secondary | ICD-10-CM

## 2017-04-07 DIAGNOSIS — K219 Gastro-esophageal reflux disease without esophagitis: Secondary | ICD-10-CM

## 2017-04-07 MED ORDER — RANITIDINE HCL 300 MG PO TABS: 300.0000 mg | ORAL_TABLET | Freq: Every day | ORAL | 0 refills | Status: DC

## 2017-04-07 MED ORDER — OMEPRAZOLE 40 MG PO CPDR: 40.0000 mg | DELAYED_RELEASE_CAPSULE | Freq: Two times a day (BID) | ORAL | 0 refills | Status: DC

## 2017-04-07 NOTE — Telephone Encounter (Signed)
Prescriptions sent to patient's mail order pharmacy and informed patient to keep appt for further refills. Patient verbalized understanding.

## 2017-04-23 DIAGNOSIS — E113512 Type 2 diabetes mellitus with proliferative diabetic retinopathy with macular edema, left eye: Secondary | ICD-10-CM | POA: Diagnosis not present

## 2017-05-18 ENCOUNTER — Other Ambulatory Visit: Payer: Self-pay | Admitting: Endocrinology

## 2017-05-22 ENCOUNTER — Ambulatory Visit: Payer: Medicare HMO | Admitting: Gastroenterology

## 2017-05-22 ENCOUNTER — Encounter (INDEPENDENT_AMBULATORY_CARE_PROVIDER_SITE_OTHER): Payer: Self-pay

## 2017-05-22 ENCOUNTER — Encounter: Payer: Self-pay | Admitting: Gastroenterology

## 2017-05-22 VITALS — BP 146/64 | HR 92 | Ht 61.0 in | Wt 180.2 lb

## 2017-05-22 DIAGNOSIS — Z1211 Encounter for screening for malignant neoplasm of colon: Secondary | ICD-10-CM | POA: Diagnosis not present

## 2017-05-22 DIAGNOSIS — K2100 Gastro-esophageal reflux disease with esophagitis, without bleeding: Secondary | ICD-10-CM

## 2017-05-22 DIAGNOSIS — K5904 Chronic idiopathic constipation: Secondary | ICD-10-CM | POA: Diagnosis not present

## 2017-05-22 DIAGNOSIS — K2270 Barrett's esophagus without dysplasia: Secondary | ICD-10-CM

## 2017-05-22 DIAGNOSIS — Z1212 Encounter for screening for malignant neoplasm of rectum: Secondary | ICD-10-CM | POA: Diagnosis not present

## 2017-05-22 DIAGNOSIS — K219 Gastro-esophageal reflux disease without esophagitis: Secondary | ICD-10-CM | POA: Diagnosis not present

## 2017-05-22 MED ORDER — RANITIDINE HCL 300 MG PO TABS: 300.0000 mg | ORAL_TABLET | Freq: Every day | ORAL | 3 refills | Status: DC

## 2017-05-22 MED ORDER — OMEPRAZOLE 40 MG PO CPDR: 40.0000 mg | DELAYED_RELEASE_CAPSULE | Freq: Two times a day (BID) | ORAL | 3 refills | Status: DC

## 2017-05-22 NOTE — Progress Notes (Signed)
    History of Present Illness: This is a 72 year old female LA Class C esophagitis and Barrett's esophagus.  She relates that her reflux symptoms are under good control she notes mild breakthrough reflux symptoms about once per week.  She has ongoing mild constipation that has not changed.  She overdue for surveillance endoscopy and she is overdue for screening colonoscopy  Current Medications, Allergies, Past Medical History, Past Surgical History, Family History and Social History were reviewed in [employer] record.  Physical Exam: General: Well developed, well nourished, no acute distress Head: Normocephalic and atraumatic Eyes:  sclerae anicteric, EOMI Ears: Normal auditory acuity Mouth: No deformity or lesions Lungs: Clear throughout to auscultation Heart: Regular rate and rhythm; no murmurs, rubs or bruits Abdomen: Soft, non tender and non distended. No masses, hepatosplenomegaly or hernias noted. Normal Bowel sounds Rectal: Deferred to colonoscopy Musculoskeletal: Symmetrical with no gross deformities  Pulses:  Normal pulses noted Extremities: No clubbing, cyanosis, edema or deformities noted Neurological: Alert oriented x 4, grossly nonfocal Psychological:  Alert and cooperative. Normal mood and affect  Assessment and Recommendations:  1.  History of Barrett's esophagus and erosive esophagitis.  Follow standard antireflux measures.  Continue omeprazole 40 mg p.o. twice daily and ranitidine 300 mg at bedtime.  She feels it would be difficult for her to find a care partner for procedures.  Schedule surveillance EGD. The risks (including bleeding, perforation, infection, missed lesions, medication reactions and possible hospitalization or surgery if complications occur), benefits, and alternatives to endoscopy with possible biopsy and possible dilation were discussed with the patient and they consent to proceed.   2.  CRC screening, average risk.  Schedule  colonoscopy.  Again she feels she may have a difficult time finding a care partner for procedures. The risks (including bleeding, perforation, infection, missed lesions, medication reactions and possible hospitalization or surgery if complications occur), benefits, and alternatives to colonoscopy with possible biopsy and possible polypectomy were discussed with the patient and they consent to proceed.

## 2017-05-22 NOTE — Patient Instructions (Addendum)
We have sent the following prescriptions to your mail in pharmacy: omeprazole and ranitidine.   If you have not heard from your mail in pharmacy within 1 week or if you have not received your medication in the mail, please contact us at [phone] so we may find out why.  It has been recommended to you by your physician that you have a(n) Endoscopy/Colonoscopy completed. Per your request, we did not schedule the procedure(s) today. Please contact our office at [phone] should you decide to have the procedure completed.  Normal BMI (Body Mass Index- based on height and weight) is between 23 and 30. Your BMI today is Body mass index is 34.05 kg/m. Marland Kitchen Please consider follow up  regarding your BMI with your Primary Care Provider.  Thank you for choosing me and [facility].  Pricilla Riffle. Dagoberto Ligas., MD., Marval Regal

## 2017-05-27 ENCOUNTER — Encounter: Payer: Self-pay | Admitting: Endocrinology

## 2017-05-27 ENCOUNTER — Ambulatory Visit (INDEPENDENT_AMBULATORY_CARE_PROVIDER_SITE_OTHER): Payer: Medicare HMO | Admitting: Endocrinology

## 2017-05-27 VITALS — BP 122/64 | HR 91 | Wt 180.2 lb

## 2017-05-27 DIAGNOSIS — E1165 Type 2 diabetes mellitus with hyperglycemia: Secondary | ICD-10-CM

## 2017-05-27 LAB — POCT GLYCOSYLATED HEMOGLOBIN (HGB A1C): Hemoglobin A1C: 10

## 2017-05-27 NOTE — Patient Instructions (Addendum)
Please take the insulin all with breakfast.   On this type of insulin schedule, you should eat meals on a regular schedule.  If a meal is missed or significantly delayed, your blood sugar could go low.   Please come back for a follow-up appointment in 2 months.  check your blood sugar twice a day.  vary the time of day when you check, between before the 3 meals, and at bedtime.  also check if you have symptoms of your blood sugar being too high or too low.  please keep a record of the readings and bring it to your next appointment here (or you can bring the meter itself).  You can write it on any piece of paper.  please call us sooner if your blood sugar goes below 70, or if you have a lot of readings over 200.

## 2017-05-27 NOTE — Progress Notes (Signed)
Subjective:    Patient ID: Yvonne Reynolds, female    DOB: Jun 27, 1945, 72 y.o.   MRN: [id number]  HPI Pt returns for f/u of diabetes mellitus: DM type: Insulin-requiring type 2 Dx'ed: 5397 Complications: retinopathy and nephropathy.   Therapy: insulin since 1999 GDM: never.  DKA: never.   Severe hypoglycemia: once, in mid-2017.   Pancreatitis: never.  Other: she declines multiple daily injections.  She takes she takes human insulin, due to cost.  She takes Reg QAM; the pattern of her cbg's indicates she needs most of her daily insulin in the early hrs of the day.   Interval history: no cbg record, but states cbg's are still often low in the middle of the night.   She says she often does not take insulin until later in the day.   Past Medical History:  Diagnosis Date  . Complication of anesthesia    states was hard to wake up after 1st EGD  . Depression   . GERD (gastroesophageal reflux disease)   . High cholesterol   . History of seizure    x 1 - due to hyperglycemia  . Hypertension    states under control with med., has been on med. > 20 yr.  . Insulin dependent diabetes mellitus ([place])   . Macular degeneration   . Nocturia   . Osteoarthritis    shoulder  . Osteoporosis   . Popping of temporomandibular joint on opening of jaw   . Sebaceous cyst 09/2014   chest wall - sternum  . Toenail fungus     Past Surgical History:  Procedure Laterality Date  . ABDOMINAL HYSTERECTOMY     partial  . BLADDER SUSPENSION    . CATARACT EXTRACTION W/ INTRAOCULAR LENS  IMPLANT, BILATERAL Bilateral   . CESAREAN SECTION    . ESOPHAGOGASTRODUODENOSCOPY  05/07/2004  . ESOPHAGOGASTRODUODENOSCOPY (EGD) WITH PROPOFOL  07/06/2013  . LOWER EXTREMITY ANGIOGRAM Bilateral 04/12/2015   Procedure: Lower Extremity Angiogram;  Surgeon: Conrad Utica, MD;  Location: [facility];  Service: Cardiovascular;  Laterality: Bilateral;  . MASS EXCISION N/A 10/17/2014   Procedure: EXCISION 2X3CM  SUBCUTANEOUS CHEST WALL CYST;  Surgeon: Rolm Bookbinder, MD;  Location: [facility];  Service: General;  Laterality: N/A;  . ORIF ANKLE FRACTURE Left 07/23/2012   Procedure: OPEN REDUCTION INTERNAL FIXATION (ORIF) ANKLE FRACTURE;  Surgeon: Jessy Oto, MD;  Location: [place];  Service: Orthopedics;  Laterality: Left;  . ORIF WRIST FRACTURE Left   . PERIPHERAL VASCULAR CATHETERIZATION N/A 04/12/2015   Procedure: Abdominal Aortogram;  Surgeon: Conrad Machias, MD;  Location: [facility];  Service: Cardiovascular;  Laterality: N/A;  . PERIPHERAL VASCULAR CATHETERIZATION Right 04/12/2015   Procedure: Peripheral Vascular Balloon Angioplasty;  Surgeon: Conrad Vowinckel, MD;  Location: [facility];  Service: Cardiovascular;  Laterality: Right;  rt sfa drug coated balloon    Social History   Socioeconomic History  . Marital status: Widowed    Spouse name: Not on file  . Number of children: 3  . Years of education: Not on file  . Highest education level: Not on file  Occupational History  . 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 Financial resource strain: Not on file  . Food insecurity:    Worry: Not on file    Inability: Not on file  . Transportation needs:    Medical: Not  on file    Non-medical: Not on file  Tobacco Use  . Smoking status: Former Smoker    Packs/day: 0.00    Last attempt to quit: 11/11/2011    Years since quitting: 5.5  . Smokeless tobacco: Never Used  Substance and Sexual Activity  . Alcohol use: No  . Drug use: No  . Sexual activity: Not on file  Lifestyle  . Physical activity:    Days per week: Not on file    Minutes per session: Not on file  . Stress: Not on file  Relationships  . Social connections:    Talks on phone: Not on file    Gets together: Not on file    Attends religious service: Not on file    Active member of club or organization: Not on file     Attends meetings of clubs or organizations: Not on file    Relationship status: Not on file  . Intimate partner violence:    Fear of current or ex partner: Not on file    Emotionally abused: Not on file    Physically abused: Not on file    Forced sexual activity: Not on file  Other Topics Concern  . Not on file  Social History Narrative   Widowed   Daily Caffeine Use-2 cups daily or more   Pt does not get regular exercise   Works [employer], 3rd shift    Current Outpatient Medications on File Prior to Visit  Medication Sig Dispense Refill  . calcium citrate-vitamin D (CITRACAL+D) 315-200 MG-UNIT tablet Take 1 tablet by mouth 2 (two) times daily.    . Blood Glucose Monitoring Suppl (TRUE METRIX AIR GLUCOSE METER) W/DEVICE KIT 1 Device by Other route once. 1 kit 0  . calcium carbonate (OS-CAL) 600 MG TABS Take 1,200 mg by mouth daily.     . insulin regular (NOVOLIN R) 100 units/mL injection Inject 1 mL (100 Units total) into the skin daily with breakfast. 40 mL 11  . INSULIN SYRINGE 1CC/29G (MONOJECT ULTRA COMFORT SYRINGE) 29G X 1/2" 1 ML MISC USED TO INJECT INSULIN SUBCUTANEOUSLY TWICE DAILY. 200 each 2  . lisinopril (PRINIVIL,ZESTRIL) 10 MG tablet TAKE 1 TABLET EVERY DAY 90 tablet 3  . omeprazole (PRILOSEC) 40 MG capsule Take 1 capsule (40 mg total) by mouth 2 (two) times daily. 180 capsule 3  . ranitidine (ZANTAC) 300 MG tablet Take 1 tablet (300 mg total) by mouth at bedtime. 90 tablet 3  . rosuvastatin (CRESTOR) 10 MG tablet Take 1 tablet (10 mg total) by mouth daily. 90 tablet 3  . TRUE METRIX BLOOD GLUCOSE TEST test strip TEST TWO TIMES DAILY 200 each 3  . TRUEPLUS LANCETS 28G MISC TEST TWO TIMES DAILY 200 each 3  . venlafaxine XR (EFFEXOR-XR) 150 MG 24 hr capsule Take 1 capsule (150 mg total) by mouth daily with breakfast. 30 capsule 11  . vitamin D, CHOLECALCIFEROL, 400 UNITS tablet Take 2,000 Units by mouth daily.     . vitamin E (VITAMIN E) 400 UNIT capsule Take 400 Units by  mouth daily.     No current facility-administered medications on file prior to visit.     Allergies  Allergen Reactions  . Procaine Other (See Comments)    Heart race  . Adhesive [Tape] Rash and Other (See Comments)    SKIN IRRITATION    Family History  Problem Relation Age of Onset  . Ovarian cancer Mother   . Stroke Father   . Diabetes  Sister   . Stroke Sister   . Heart disease Sister        before age 6  . Diabetes Brother   . Heart disease Brother        before age 52  . Diabetes Sister     BP 122/64   Pulse 91   Wt 180 lb 3.2 oz (81.7 kg)   SpO2 93%   BMI 34.05 kg/m    Review of Systems She denies LOC.      Objective:   Physical Exam VITAL SIGNS:  See vs page GENERAL: no distress Pulses: dorsalis pedis intact bilat.   MSK: no deformity of the feet CV: no leg edema Skin:  no ulcer on the feet.  normal color and temp on the feet. Neuro: sensation is intact to touch on the feet  Lab Results  Component Value Date   HGBA1C 10.0 05/27/2017       Assessment & Plan:  Insulin-requiring type 2 DM: worse.  Noncompliance with cbg recording and insulin timing. We discussed.     Patient Instructions  Please take the insulin all with breakfast.   On this type of insulin schedule, you should eat meals on a regular schedule.  If a meal is missed or significantly delayed, your blood sugar could go low.   Please come back for a follow-up appointment in 2 months.  check your blood sugar twice a day.  vary the time of day when you check, between before the 3 meals, and at bedtime.  also check if you have symptoms of your blood sugar being too high or too low.  please keep a record of the readings and bring it to your next appointment here (or you can bring the meter itself).  You can write it on any piece of paper.  please call us sooner if your blood sugar goes below 70, or if you have a lot of readings over 200.

## 2017-05-28 DIAGNOSIS — H35371 Puckering of macula, right eye: Secondary | ICD-10-CM | POA: Diagnosis not present

## 2017-05-28 DIAGNOSIS — H4312 Vitreous hemorrhage, left eye: Secondary | ICD-10-CM | POA: Diagnosis not present

## 2017-05-28 DIAGNOSIS — E113511 Type 2 diabetes mellitus with proliferative diabetic retinopathy with macular edema, right eye: Secondary | ICD-10-CM | POA: Diagnosis not present

## 2017-05-28 DIAGNOSIS — E113513 Type 2 diabetes mellitus with proliferative diabetic retinopathy with macular edema, bilateral: Secondary | ICD-10-CM | POA: Diagnosis not present

## 2017-05-28 DIAGNOSIS — H43812 Vitreous degeneration, left eye: Secondary | ICD-10-CM | POA: Diagnosis not present

## 2017-06-04 DIAGNOSIS — E113511 Type 2 diabetes mellitus with proliferative diabetic retinopathy with macular edema, right eye: Secondary | ICD-10-CM | POA: Diagnosis not present

## 2017-06-18 DIAGNOSIS — E113512 Type 2 diabetes mellitus with proliferative diabetic retinopathy with macular edema, left eye: Secondary | ICD-10-CM | POA: Diagnosis not present

## 2017-07-02 DIAGNOSIS — E113513 Type 2 diabetes mellitus with proliferative diabetic retinopathy with macular edema, bilateral: Secondary | ICD-10-CM | POA: Diagnosis not present

## 2017-07-02 DIAGNOSIS — H35371 Puckering of macula, right eye: Secondary | ICD-10-CM | POA: Diagnosis not present

## 2017-07-02 DIAGNOSIS — E113511 Type 2 diabetes mellitus with proliferative diabetic retinopathy with macular edema, right eye: Secondary | ICD-10-CM | POA: Diagnosis not present

## 2017-07-28 ENCOUNTER — Ambulatory Visit: Payer: Medicare HMO | Admitting: Endocrinology

## 2017-07-28 DIAGNOSIS — Z0289 Encounter for other administrative examinations: Secondary | ICD-10-CM

## 2017-08-04 ENCOUNTER — Other Ambulatory Visit: Payer: Self-pay | Admitting: Endocrinology

## 2017-08-27 ENCOUNTER — Encounter: Payer: Self-pay | Admitting: Endocrinology

## 2017-08-27 ENCOUNTER — Ambulatory Visit (INDEPENDENT_AMBULATORY_CARE_PROVIDER_SITE_OTHER): Payer: Medicare HMO | Admitting: Endocrinology

## 2017-08-27 VITALS — BP 132/50 | HR 108 | Temp 98.4°F | Ht 61.0 in | Wt 178.0 lb

## 2017-08-27 DIAGNOSIS — E1165 Type 2 diabetes mellitus with hyperglycemia: Secondary | ICD-10-CM | POA: Diagnosis not present

## 2017-08-27 LAB — POCT GLYCOSYLATED HEMOGLOBIN (HGB A1C): Hemoglobin A1C: 9.9 % — AB (ref 4.0–5.6)

## 2017-08-27 MED ORDER — TRIAMCINOLONE ACETONIDE 0.1 % EX CREA: 1.0000 "application " | TOPICAL_CREAM | Freq: Four times a day (QID) | CUTANEOUS | 0 refills | Status: DC

## 2017-08-27 MED ORDER — INSULIN ASPART 100 UNIT/ML FLEXPEN: 80.0000 [IU] | PEN_INJECTOR | Freq: Every day | SUBCUTANEOUS | 11 refills | Status: DC

## 2017-08-27 MED ORDER — VALACYCLOVIR HCL 1 G PO TABS
1000.0000 mg | ORAL_TABLET | Freq: Two times a day (BID) | ORAL | 0 refills | Status: DC
Start: 2017-08-27 — End: 2017-10-27

## 2017-08-27 NOTE — Patient Instructions (Addendum)
I have sent 2 prescriptions to your pharmacy: antibiotic against shingles, and for the itching.   On this type of insulin schedule, you should eat meals on a regular schedule.  If a meal is missed or significantly delayed, your blood sugar could go low. I have also sent a prescription to your pharmacy, to change reg insulin to novolog, 80 units with breakfast. Please call or message [place] next week, to tell us how the blood sugar is doing.   Please come back for a follow-up appointment in 3 months.  check your blood sugar twice a day.  vary the time of day when you check, between before the 3 meals, and at bedtime.  also check if you have symptoms of your blood sugar being too high or too low.  please keep a record of the readings and bring it to your next appointment here (or you can bring the meter itself).  You can write it on any piece of paper.  please call us sooner if your blood sugar goes below 70, or if you have a lot of readings over 200.

## 2017-08-27 NOTE — Progress Notes (Signed)
Subjective:    Patient ID: Yvonne Reynolds, female    DOB: [birth year], 72 y.o.   MRN: [id number]  HPI  Pt returns for f/u of diabetes mellitus: DM type: Insulin-requiring type 2 Dx'ed: 3300 Complications: retinopathy and nephropathy.   Therapy: insulin since 1999 GDM: never.  DKA: never.   Severe hypoglycemia: once, in mid-2017.   Pancreatitis: never.  Other: she declines multiple daily injections.  She takes she takes human insulin, due to cost.  She takes Reg QAM; the pattern of her cbg's indicates she needs most of her daily insulin in the early hrs of the day.   Interval history: She has mild hypoglycemia in the afternoon or fasting, approx 3-4 times per week.  no cbg record, but states cbg's are highest after breakfast (200's).  She says she never misses the insulin.   2 weeks of moderate rash on the left mid-back and left lat chest.  She has assoc pain.   Past Medical History:  Diagnosis Date  . Complication of anesthesia    states was hard to wake up after 1st EGD  . Depression   . GERD (gastroesophageal reflux disease)   . High cholesterol   . History of seizure    x 1 - due to hyperglycemia  . Hypertension    states under control with med., has been on med. > 20 yr.  . Insulin dependent diabetes mellitus ([place])   . Macular degeneration   . Nocturia   . Osteoarthritis    shoulder  . Osteoporosis   . Popping of temporomandibular joint on opening of jaw   . Sebaceous cyst 09/2014   chest wall - sternum  . Toenail fungus     Past Surgical History:  Procedure Laterality Date  . ABDOMINAL HYSTERECTOMY     partial  . BLADDER SUSPENSION    . CATARACT EXTRACTION W/ INTRAOCULAR LENS  IMPLANT, BILATERAL Bilateral   . CESAREAN SECTION    . ESOPHAGOGASTRODUODENOSCOPY  05/07/2004  . ESOPHAGOGASTRODUODENOSCOPY (EGD) WITH PROPOFOL  07/06/2013  . LOWER EXTREMITY ANGIOGRAM Bilateral 04/12/2015   Procedure: Lower Extremity Angiogram;  Surgeon: Conrad Ada, MD;  Location: [facility];  Service: Cardiovascular;  Laterality: Bilateral;  . MASS EXCISION N/A 10/17/2014   Procedure: EXCISION 2X3CM SUBCUTANEOUS CHEST WALL CYST;  Surgeon: Rolm Bookbinder, MD;  Location: [facility];  Service: General;  Laterality: N/A;  . ORIF ANKLE FRACTURE Left 07/23/2012   Procedure: OPEN REDUCTION INTERNAL FIXATION (ORIF) ANKLE FRACTURE;  Surgeon: Jessy Oto, MD;  Location: [facility];  Service: Orthopedics;  Laterality: Left;  . ORIF WRIST FRACTURE Left   . PERIPHERAL VASCULAR CATHETERIZATION N/A 04/12/2015   Procedure: Abdominal Aortogram;  Surgeon: Conrad Birdsboro, MD;  Location: [facility];  Service: Cardiovascular;  Laterality: N/A;  . PERIPHERAL VASCULAR CATHETERIZATION Right 04/12/2015   Procedure: Peripheral Vascular Balloon Angioplasty;  Surgeon: Conrad [place], MD;  Location: [facility];  Service: Cardiovascular;  Laterality: Right;  rt sfa drug coated balloon    Social History   Socioeconomic History  . Marital status: Widowed    Spouse name: Not on file  . Number of children: 3  . Years of education: Not on file  . Highest education level: Not on file  Occupational History  . 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 Financial resource strain: Not on file  . Food  insecurity:    Worry: Not on file    Inability: Not on file  . Transportation needs:    Medical: Not on file    Non-medical: Not on file  Tobacco Use  . Smoking status: Former Smoker    Packs/day: 0.00    Last attempt to quit: 11/11/2011    Years since quitting: 5.8  . Smokeless tobacco: Never Used  Substance and Sexual Activity  . Alcohol use: No  . Drug use: No  . Sexual activity: Not on file  Lifestyle  . Physical activity:    Days per week: Not on file    Minutes per session: Not on file  . Stress: Not on file  Relationships  . Social connections:    Talks on phone: Not on file    Gets  together: Not on file    Attends religious service: Not on file    Active member of club or organization: Not on file    Attends meetings of clubs or organizations: Not on file    Relationship status: Not on file  . Intimate partner violence:    Fear of current or ex partner: Not on file    Emotionally abused: Not on file    Physically abused: Not on file    Forced sexual activity: Not on file  Other Topics Concern  . Not on file  Social History Narrative   Widowed   Daily Caffeine Use-2 cups daily or more   Pt does not get regular exercise   Works [employer], 3rd shift    Current Outpatient Medications on File Prior to Visit  Medication Sig Dispense Refill  . Blood Glucose Monitoring Suppl (TRUE METRIX AIR GLUCOSE METER) W/DEVICE KIT 1 Device by Other route once. 1 kit 0  . calcium carbonate (OS-CAL) 600 MG TABS Take 1,200 mg by mouth daily.     . calcium citrate-vitamin D (CITRACAL+D) 315-200 MG-UNIT tablet Take 1 tablet by mouth 2 (two) times daily.    . INSULIN SYRINGE 1CC/29G (MONOJECT ULTRA COMFORT SYRINGE) 29G X 1/2" 1 ML MISC USED TO INJECT INSULIN SUBCUTANEOUSLY TWICE DAILY. 200 each 2  . lisinopril (PRINIVIL,ZESTRIL) 10 MG tablet TAKE 1 TABLET EVERY DAY 90 tablet 3  . omeprazole (PRILOSEC) 40 MG capsule Take 1 capsule (40 mg total) by mouth 2 (two) times daily. 180 capsule 3  . ranitidine (ZANTAC) 300 MG tablet Take 1 tablet (300 mg total) by mouth at bedtime. 90 tablet 3  . rosuvastatin (CRESTOR) 10 MG tablet Take 1 tablet (10 mg total) by mouth daily. 90 tablet 3  . TRUE METRIX BLOOD GLUCOSE TEST test strip TEST TWO TIMES DAILY 200 each 3  . TRUEPLUS LANCETS 28G MISC TEST TWO TIMES DAILY 200 each 3  . venlafaxine XR (EFFEXOR-XR) 150 MG 24 hr capsule Take 1 capsule (150 mg total) by mouth daily with breakfast. 30 capsule 11  . vitamin D, CHOLECALCIFEROL, 400 UNITS tablet Take 2,000 Units by mouth daily.     . vitamin E (VITAMIN E) 400 UNIT capsule Take 400 Units by mouth  daily.     No current facility-administered medications on file prior to visit.     Allergies  Allergen Reactions  . Procaine Other (See Comments)    Heart race  . Adhesive [Tape] Rash and Other (See Comments)    SKIN IRRITATION    Family History  Problem Relation Age of Onset  . Ovarian cancer Mother   . Stroke Father   . Diabetes  Sister   . Stroke Sister   . Heart disease Sister        before age 71  . Diabetes Brother   . Heart disease Brother        before age 64  . Diabetes Sister     BP (!) 132/50 (BP Location: Left Arm, Patient Position: Sitting, Cuff Size: Normal)   Pulse (!) 108   Temp 98.4 F (36.9 C) (Oral)   Ht 5' 1"  (1.549 m)   Wt 178 lb (80.7 kg)   SpO2 97%   BMI 33.63 kg/m    Review of Systems The rash is slightly itchy.  Denies fever.      Objective:   Physical Exam VITAL SIGNS:  See vs page GENERAL: no distress Left lat chest/mid-back: few red vesicles.    Lab Results  Component Value Date   HGBA1C 9.9 (A) 08/27/2017       Assessment & Plan:  [place]: new Insulin-requiring type 2 DM, with DR: Based on the pattern of her cbg's, she needs a faster-acting qd insulin.  For safety, we'll start with a lower dosage.    Patient Instructions  I have sent 2 prescriptions to your pharmacy: antibiotic against shingles, and for the itching.   On this type of insulin schedule, you should eat meals on a regular schedule.  If a meal is missed or significantly delayed, your blood sugar could go low. I have also sent a prescription to your pharmacy, to change reg insulin to novolog, 80 units with breakfast. Please call or message [place] next week, to tell us how the blood sugar is doing.   Please come back for a follow-up appointment in 3 months.  check your blood sugar twice a day.  vary the time of day when you check, between before the 3 meals, and at bedtime.  also check if you have symptoms of your blood sugar being too high or too low.  please keep a  record of the readings and bring it to your next appointment here (or you can bring the meter itself).  You can write it on any piece of paper.  please call us sooner if your blood sugar goes below 70, or if you have a lot of readings over 200.

## 2017-09-01 ENCOUNTER — Telehealth: Payer: Self-pay | Admitting: Endocrinology

## 2017-09-01 NOTE — Telephone Encounter (Signed)
Please increase novolog to 90 units with breakfast. Please call or message [place] next week, to tell us how the blood sugar is doing

## 2017-09-01 NOTE — Telephone Encounter (Signed)
Patient feels she needs a higher dosage of her insulin. Her blood sugar is 369. Please call patient and advise at ph# [phone]

## 2017-09-01 NOTE — Telephone Encounter (Signed)
Please advise 

## 2017-09-02 NOTE — Telephone Encounter (Signed)
LVM for patient to call back to instruct her in dosage increase.

## 2017-09-04 NOTE — Telephone Encounter (Signed)
I have tried patient again, but was unable to reach her.

## 2017-09-09 ENCOUNTER — Other Ambulatory Visit: Payer: Self-pay | Admitting: Endocrinology

## 2017-09-10 DIAGNOSIS — E113513 Type 2 diabetes mellitus with proliferative diabetic retinopathy with macular edema, bilateral: Secondary | ICD-10-CM | POA: Diagnosis not present

## 2017-09-10 DIAGNOSIS — H35371 Puckering of macula, right eye: Secondary | ICD-10-CM | POA: Diagnosis not present

## 2017-09-10 DIAGNOSIS — E113511 Type 2 diabetes mellitus with proliferative diabetic retinopathy with macular edema, right eye: Secondary | ICD-10-CM | POA: Diagnosis not present

## 2017-09-10 DIAGNOSIS — H4312 Vitreous hemorrhage, left eye: Secondary | ICD-10-CM | POA: Diagnosis not present

## 2017-09-16 ENCOUNTER — Ambulatory Visit: Payer: Medicare HMO | Admitting: Endocrinology

## 2017-09-29 ENCOUNTER — Telehealth: Payer: Self-pay | Admitting: Endocrinology

## 2017-09-29 NOTE — Telephone Encounter (Signed)
Patient is needing a referral sent to the wound center so she can be seen Please advise

## 2017-09-30 NOTE — Telephone Encounter (Signed)
I can do that, but first, it is faster for me to see this tomorrow.  Please sched ov here tomorrow.

## 2017-09-30 NOTE — Telephone Encounter (Signed)
lft vm for call back for more info on referral that is being requested

## 2017-09-30 NOTE — Telephone Encounter (Signed)
Spoke to pt and she stated that her toe on her rt foot is turning black and that she needs a referral to wound care placed

## 2017-09-30 NOTE — Telephone Encounter (Signed)
Please schedule

## 2017-10-01 ENCOUNTER — Ambulatory Visit (INDEPENDENT_AMBULATORY_CARE_PROVIDER_SITE_OTHER): Payer: Medicare HMO | Admitting: Endocrinology

## 2017-10-01 ENCOUNTER — Encounter: Payer: Self-pay | Admitting: Endocrinology

## 2017-10-01 VITALS — BP 134/70 | HR 100 | Ht 61.0 in | Wt 247.6 lb

## 2017-10-01 DIAGNOSIS — Z794 Long term (current) use of insulin: Secondary | ICD-10-CM

## 2017-10-01 DIAGNOSIS — E1151 Type 2 diabetes mellitus with diabetic peripheral angiopathy without gangrene: Secondary | ICD-10-CM | POA: Diagnosis not present

## 2017-10-01 MED ORDER — INSULIN ASPART 100 UNIT/ML FLEXPEN
80.0000 [IU] | PEN_INJECTOR | Freq: Every day | SUBCUTANEOUS | 11 refills | Status: DC
Start: 2017-10-01 — End: 2017-10-12

## 2017-10-01 MED ORDER — DOXYCYCLINE HYCLATE 100 MG PO TABS: 100.0000 mg | ORAL_TABLET | Freq: Two times a day (BID) | ORAL | 0 refills | Status: DC

## 2017-10-01 NOTE — Progress Notes (Signed)
Subjective:    Patient ID: Yvonne Reynolds, female    DOB: [birth year], 72 y.o.   MRN: [id number]  HPI  Pt returns for f/u of diabetes mellitus: DM type: Insulin-requiring type 2 Dx'ed: 1779 Complications: retinopathy and nephropathy.   Therapy: insulin since 1999 GDM: never.  DKA: never.   Severe hypoglycemia: once, in mid-2017.   Pancreatitis: never.  Other: she declines multiple daily injections.  She takes she takes human insulin, due to cost.  She takes Reg QAM; the pattern of her cbg's indicates she needs most of her daily insulin in the early hrs of the day.   Interval history: she had to go back to previous insulin, after she ran out of the new one.  She says this happened because pharmacy only gave her 1 box of pens.  cbg's have increased to the 200's.   Few weeks of moderate pain at the right 4th toe, and assoc swelling.  This started when she struck it on a rock, while walking Past Medical History:  Diagnosis Date  . Complication of anesthesia    states was hard to wake up after 1st EGD  . Depression   . GERD (gastroesophageal reflux disease)   . High cholesterol   . History of seizure    x 1 - due to hyperglycemia  . Hypertension    states under control with med., has been on med. > 20 yr.  . Insulin dependent diabetes mellitus ([place])   . Macular degeneration   . Nocturia   . Osteoarthritis    shoulder  . Osteoporosis   . Popping of temporomandibular joint on opening of jaw   . Sebaceous cyst 09/2014   chest wall - sternum  . Toenail fungus     Past Surgical History:  Procedure Laterality Date  . ABDOMINAL HYSTERECTOMY     partial  . BLADDER SUSPENSION    . CATARACT EXTRACTION W/ INTRAOCULAR LENS  IMPLANT, BILATERAL Bilateral   . CESAREAN SECTION    . ESOPHAGOGASTRODUODENOSCOPY  05/07/2004  . ESOPHAGOGASTRODUODENOSCOPY (EGD) WITH PROPOFOL  07/06/2013  . LOWER EXTREMITY ANGIOGRAM Bilateral 04/12/2015   Procedure: Lower Extremity Angiogram;  Surgeon: Conrad Taliaferro, MD;  Location: [facility];  Service: Cardiovascular;  Laterality: Bilateral;  . MASS EXCISION N/A 10/17/2014   Procedure: EXCISION 2X3CM SUBCUTANEOUS CHEST WALL CYST;  Surgeon: Rolm Bookbinder, MD;  Location: [facility];  Service: General;  Laterality: N/A;  . ORIF ANKLE FRACTURE Left 07/23/2012   Procedure: OPEN REDUCTION INTERNAL FIXATION (ORIF) ANKLE FRACTURE;  Surgeon: Jessy Oto, MD;  Location: [facility];  Service: Orthopedics;  Laterality: Left;  . ORIF WRIST FRACTURE Left   . PERIPHERAL VASCULAR CATHETERIZATION N/A 04/12/2015   Procedure: Abdominal Aortogram;  Surgeon: Conrad Maysville, MD;  Location: [facility];  Service: Cardiovascular;  Laterality: N/A;  . PERIPHERAL VASCULAR CATHETERIZATION Right 04/12/2015   Procedure: Peripheral Vascular Balloon Angioplasty;  Surgeon: Conrad Readlyn, MD;  Location: [facility];  Service: Cardiovascular;  Laterality: Right;  rt sfa drug coated balloon    Social History   Socioeconomic History  . Marital status: Widowed    Spouse name: Not on file  . Number of children: 3  . Years of education: Not on file  . Highest education level: Not on file  Occupational History  . 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 Financial  resource strain: Not on file  . Food insecurity:    Worry: Not on file    Inability: Not on file  . Transportation needs:    Medical: Not on file    Non-medical: Not on file  Tobacco Use  . Smoking status: Former Smoker    Packs/day: 0.00    Last attempt to quit: 11/11/2011    Years since quitting: 5.8  . Smokeless tobacco: Never Used  Substance and Sexual Activity  . Alcohol use: No  . Drug use: No  . Sexual activity: Not on file  Lifestyle  . Physical activity:    Days per week: Not on file    Minutes per session: Not on file  . Stress: Not on file  Relationships  . Social connections:    Talks on phone:  Not on file    Gets together: Not on file    Attends religious service: Not on file    Active member of club or organization: Not on file    Attends meetings of clubs or organizations: Not on file    Relationship status: Not on file  . Intimate partner violence:    Fear of current or ex partner: Not on file    Emotionally abused: Not on file    Physically abused: Not on file    Forced sexual activity: Not on file  Other Topics Concern  . Not on file  Social History Narrative   Widowed   Daily Caffeine Use-2 cups daily or more   Pt does not get regular exercise   Works [employer], 3rd shift    Current Outpatient Medications on File Prior to Visit  Medication Sig Dispense Refill  . Blood Glucose Monitoring Suppl (TRUE METRIX AIR GLUCOSE METER) W/DEVICE KIT 1 Device by Other route once. 1 kit 0  . calcium carbonate (OS-CAL) 600 MG TABS Take 1,200 mg by mouth daily.     . calcium citrate-vitamin D (CITRACAL+D) 315-200 MG-UNIT tablet Take 1 tablet by mouth 2 (two) times daily.    . INSULIN SYRINGE 1CC/29G (MONOJECT ULTRA COMFORT SYRINGE) 29G X 1/2" 1 ML MISC USED TO INJECT INSULIN SUBCUTANEOUSLY TWICE DAILY. 200 each 2  . lisinopril (PRINIVIL,ZESTRIL) 10 MG tablet TAKE 1 TABLET EVERY DAY 90 tablet 3  . omeprazole (PRILOSEC) 40 MG capsule Take 1 capsule (40 mg total) by mouth 2 (two) times daily. 180 capsule 3  . ranitidine (ZANTAC) 300 MG tablet Take 1 tablet (300 mg total) by mouth at bedtime. 90 tablet 3  . rosuvastatin (CRESTOR) 10 MG tablet TAKE 1 TABLET EVERY DAY 90 tablet 3  . triamcinolone cream (KENALOG) 0.1 % Apply 1 application topically 4 (four) times daily. As needed for itching 80 g 0  . TRUE METRIX BLOOD GLUCOSE TEST test strip TEST TWO TIMES DAILY 200 each 3  . TRUEPLUS LANCETS 28G MISC TEST TWO TIMES DAILY 200 each 3  . valACYclovir (VALTREX) 1000 MG tablet Take 1 tablet (1,000 mg total) by mouth 2 (two) times daily. 10 tablet 0  . venlafaxine XR (EFFEXOR-XR) 150 MG 24 hr  capsule TAKE 1 CAPSULE (150 MG TOTAL) BY MOUTH DAILY WITH BREAKFAST. 90 capsule 11  . vitamin D, CHOLECALCIFEROL, 400 UNITS tablet Take 2,000 Units by mouth daily.     . vitamin E (VITAMIN E) 400 UNIT capsule Take 400 Units by mouth daily.     No current facility-administered medications on file prior to visit.     Allergies  Allergen Reactions  .  Procaine Other (See Comments)    Heart race  . Adhesive [Tape] Rash and Other (See Comments)    SKIN IRRITATION    Family History  Problem Relation Age of Onset  . Ovarian cancer Mother   . Stroke Father   . Diabetes Sister   . Stroke Sister   . Heart disease Sister        before age 72  . Diabetes Brother   . Heart disease Brother        before age 78  . Diabetes Sister     BP 134/70 (BP Location: Right Arm, Patient Position: Sitting, Cuff Size: Normal)   Pulse 100   Ht _0  (1.549 m)   Wt 247 lb 9.6 oz (112.3 kg)   SpO2 98%   BMI 46.78 kg/m    Review of Systems Denies fever.  The toe drained for a week or so, but that has stopped.      Objective:   Physical Exam VITAL SIGNS:  See vs page GENERAL: no distress Right 4th toe: slight swelling.  Moderate ecchymosis.  On the dorsal aspect, there is a 5-8 mm shallow ulcer.        Assessment & Plan:  Insulin-requiring type 2 DM, with DR: worse.   Toe contusion, with new shallow ulcer: she declines x-ray.    Patient Instructions  The previous prescription was for 2 boxes at a time, but I have resent to make sure they give you enough. I have also sent a prescription to your pharmacy, for an antibiotic pill.   You will get better much faster if you elevate your foot above the rest of your body.  Also, you should keep the scrape on your toe covered with antibiotic ointment and a bandaid.   I hope you feel better soon.  If you don't feel better by week after next, please call back.  Please call sooner if you get worse. I'll see you next time.

## 2017-10-01 NOTE — Patient Instructions (Addendum)
The previous prescription was for 2 boxes at a time, but I have resent to make sure they give you enough. I have also sent a prescription to your pharmacy, for an antibiotic pill.   You will get better much faster if you elevate your foot above the rest of your body.  Also, you should keep the scrape on your toe covered with antibiotic ointment and a bandaid.   I hope you feel better soon.  If you don't feel better by week after next, please call back.  Please call sooner if you get worse. I'll see you next time.

## 2017-10-05 ENCOUNTER — Telehealth: Payer: Self-pay | Admitting: Endocrinology

## 2017-10-05 DIAGNOSIS — L97519 Non-pressure chronic ulcer of other part of right foot with unspecified severity: Secondary | ICD-10-CM

## 2017-10-05 NOTE — Telephone Encounter (Signed)
Patient's granddaughter called about patient and Stated that pt was just in our office for her toe. She said that the medication for it is not working and now she has a blister on her toe. They would like to see if there is another medication could be sent in.    It does not look like Granddaughter is on patients DPR so I advised that we could not call her back we would call and speak with patient.   Please advise

## 2017-10-05 NOTE — Telephone Encounter (Signed)
Please advise 

## 2017-10-05 NOTE — Telephone Encounter (Signed)
Please see a wound specialist.  you will receive a phone call, about a day and time for an appointment

## 2017-10-06 NOTE — Telephone Encounter (Signed)
lft vm for pt with info

## 2017-10-12 ENCOUNTER — Telehealth: Payer: Self-pay | Admitting: Endocrinology

## 2017-10-12 ENCOUNTER — Other Ambulatory Visit: Payer: Self-pay

## 2017-10-12 MED ORDER — INSULIN ASPART 100 UNIT/ML ~~LOC~~ SOLN: 80.0000 [IU] | Freq: Every day | SUBCUTANEOUS | 6 refills | Status: DC

## 2017-10-12 NOTE — Telephone Encounter (Signed)
Patient wants vials of Novalog instead of pens. Please send RX for Novalog vials to [facility] on [address]

## 2017-10-12 NOTE — Telephone Encounter (Signed)
done

## 2017-10-15 ENCOUNTER — Telehealth: Payer: Self-pay | Admitting: Endocrinology

## 2017-10-15 NOTE — Telephone Encounter (Signed)
lft vm with # to wound care center

## 2017-10-15 NOTE — Telephone Encounter (Signed)
Patient is needing to call with the number to the wound center that she was suppose to be referred to .  Please advise

## 2017-10-20 ENCOUNTER — Encounter (HOSPITAL_BASED_OUTPATIENT_CLINIC_OR_DEPARTMENT_OTHER): Payer: Medicare HMO | Attending: Internal Medicine

## 2017-10-20 ENCOUNTER — Ambulatory Visit (HOSPITAL_COMMUNITY)
Admission: RE | Admit: 2017-10-20 | Discharge: 2017-10-20 | Disposition: A | Discharge status: Home / Self Care | Payer: Medicare HMO | Source: Ambulatory Visit | Attending: Internal Medicine | Admitting: Internal Medicine

## 2017-10-20 ENCOUNTER — Other Ambulatory Visit (HOSPITAL_BASED_OUTPATIENT_CLINIC_OR_DEPARTMENT_OTHER): Payer: Self-pay | Admitting: Internal Medicine

## 2017-10-20 DIAGNOSIS — I10 Essential (primary) hypertension: Secondary | ICD-10-CM | POA: Diagnosis not present

## 2017-10-20 DIAGNOSIS — E11621 Type 2 diabetes mellitus with foot ulcer: Secondary | ICD-10-CM | POA: Diagnosis not present

## 2017-10-20 DIAGNOSIS — L97519 Non-pressure chronic ulcer of other part of right foot with unspecified severity: Secondary | ICD-10-CM | POA: Insufficient documentation

## 2017-10-20 DIAGNOSIS — L97512 Non-pressure chronic ulcer of other part of right foot with fat layer exposed: Secondary | ICD-10-CM | POA: Diagnosis not present

## 2017-10-20 DIAGNOSIS — Z794 Long term (current) use of insulin: Secondary | ICD-10-CM | POA: Diagnosis not present

## 2017-10-20 DIAGNOSIS — E1151 Type 2 diabetes mellitus with diabetic peripheral angiopathy without gangrene: Secondary | ICD-10-CM | POA: Insufficient documentation

## 2017-10-20 DIAGNOSIS — M7989 Other specified soft tissue disorders: Secondary | ICD-10-CM | POA: Insufficient documentation

## 2017-10-20 DIAGNOSIS — L97518 Non-pressure chronic ulcer of other part of right foot with other specified severity: Secondary | ICD-10-CM | POA: Insufficient documentation

## 2017-10-20 DIAGNOSIS — Z87891 Personal history of nicotine dependence: Secondary | ICD-10-CM | POA: Insufficient documentation

## 2017-10-20 DIAGNOSIS — L539 Erythematous condition, unspecified: Secondary | ICD-10-CM | POA: Diagnosis not present

## 2017-10-22 ENCOUNTER — Encounter: Payer: Self-pay | Admitting: *Deleted

## 2017-10-22 ENCOUNTER — Encounter: Payer: Self-pay | Admitting: Vascular Surgery

## 2017-10-22 ENCOUNTER — Ambulatory Visit (HOSPITAL_COMMUNITY)
Admission: RE | Admit: 2017-10-22 | Discharge: 2017-10-22 | Disposition: A | Discharge status: Home / Self Care | Payer: Medicare HMO | Source: Ambulatory Visit | Attending: Vascular Surgery | Admitting: Vascular Surgery

## 2017-10-22 ENCOUNTER — Ambulatory Visit: Payer: Medicare HMO | Admitting: Vascular Surgery

## 2017-10-22 ENCOUNTER — Other Ambulatory Visit (HOSPITAL_BASED_OUTPATIENT_CLINIC_OR_DEPARTMENT_OTHER): Payer: Self-pay | Admitting: Internal Medicine

## 2017-10-22 ENCOUNTER — Other Ambulatory Visit: Payer: Self-pay

## 2017-10-22 ENCOUNTER — Other Ambulatory Visit: Payer: Self-pay | Admitting: *Deleted

## 2017-10-22 VITALS — BP 151/76 | HR 96 | Temp 98.3°F | Resp 16 | Ht 61.0 in | Wt 172.5 lb

## 2017-10-22 DIAGNOSIS — L97519 Non-pressure chronic ulcer of other part of right foot with unspecified severity: Secondary | ICD-10-CM | POA: Diagnosis not present

## 2017-10-22 DIAGNOSIS — I739 Peripheral vascular disease, unspecified: Secondary | ICD-10-CM

## 2017-10-22 NOTE — Progress Notes (Signed)
Referring Physician: Dr Dellia Nims  Patient name: Yvonne Reynolds MRN: [id number] DOB: [birth year] Sex: female  REASON FOR CONSULT: Nonhealing wound right fourth toe  HPI: Yvonne Reynolds is a 72 y.o. female, previously known to [place].  She had a previous right superficial femoral artery angioplasty by Dr. Geryl Councilman in 2017.  She was last seen June 2018 and never returned for follow-up.  About 8 weeks ago she stubbed her toe on some gravel and the wound would not heal.  She was seen in the wound center earlier this week.  She was then referred to [place] for further evaluation.  She denies rest pain.  She does not really complain of claudication symptoms.  She cannot take aspirin secondary to severe reflux.  She states she is not smoking.  Other medical problems include diabetes and hypertension both of which are currently controlled.  Past Medical History:  Diagnosis Date  . Complication of anesthesia    states was hard to wake up after 1st EGD  . Depression   . GERD (gastroesophageal reflux disease)   . High cholesterol   . History of seizure    x 1 - due to hyperglycemia  . Hypertension    states under control with med., has been on med. > 20 yr.  . Insulin dependent diabetes mellitus ([place])   . Macular degeneration   . Nocturia   . Osteoarthritis    shoulder  . Osteoporosis   . Popping of temporomandibular joint on opening of jaw   . Sebaceous cyst 09/2014   chest wall - sternum  . Toenail fungus    Past Surgical History:  Procedure Laterality Date  . ABDOMINAL HYSTERECTOMY     partial  . BLADDER SUSPENSION    . CATARACT EXTRACTION W/ INTRAOCULAR LENS  IMPLANT, BILATERAL Bilateral   . CESAREAN SECTION    . ESOPHAGOGASTRODUODENOSCOPY  05/07/2004  . ESOPHAGOGASTRODUODENOSCOPY (EGD) WITH PROPOFOL  07/06/2013  . LOWER EXTREMITY ANGIOGRAM Bilateral 04/12/2015   Procedure: Lower Extremity Angiogram;  Surgeon: Conrad Price, MD;  Location: [facility];  Service: Cardiovascular;   Laterality: Bilateral;  . MASS EXCISION N/A 10/17/2014   Procedure: EXCISION 2X3CM SUBCUTANEOUS CHEST WALL CYST;  Surgeon: Rolm Bookbinder, MD;  Location: [facility];  Service: General;  Laterality: N/A;  . ORIF ANKLE FRACTURE Left 07/23/2012   Procedure: OPEN REDUCTION INTERNAL FIXATION (ORIF) ANKLE FRACTURE;  Surgeon: Jessy Oto, MD;  Location: [facility];  Service: Orthopedics;  Laterality: Left;  . ORIF WRIST FRACTURE Left   . PERIPHERAL VASCULAR CATHETERIZATION N/A 04/12/2015   Procedure: Abdominal Aortogram;  Surgeon: Conrad Phillipsville, MD;  Location: [facility];  Service: Cardiovascular;  Laterality: N/A;  . PERIPHERAL VASCULAR CATHETERIZATION Right 04/12/2015   Procedure: Peripheral Vascular Balloon Angioplasty;  Surgeon: Conrad , MD;  Location: [facility];  Service: Cardiovascular;  Laterality: Right;  rt sfa drug coated balloon    Family History  Problem Relation Age of Onset  . Ovarian cancer Mother   . Stroke Father   . Diabetes Sister   . Stroke Sister   . Heart disease Sister        before age 84  . Diabetes Brother   . Heart disease Brother        before age 83  . Diabetes Sister     SOCIAL HISTORY: Social History   Socioeconomic History  . Marital status: Widowed    Spouse name: Not on  file  . Number of children: 3  . Years of education: Not on file  . Highest education level: Not on file  Occupational History  . 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 Financial resource strain: Not on file  . Food insecurity:    Worry: Not on file    Inability: Not on file  . Transportation needs:    Medical: Not on file    Non-medical: Not on file  Tobacco Use  . Smoking status: Former Smoker    Packs/day: 0.00    Last attempt to quit: 11/11/2011    Years since quitting: 5.9  . Smokeless tobacco: Never Used  Substance and Sexual Activity  . Alcohol use: No  . Drug  use: No  . Sexual activity: Not on file  Lifestyle  . Physical activity:    Days per week: Not on file    Minutes per session: Not on file  . Stress: Not on file  Relationships  . Social connections:    Talks on phone: Not on file    Gets together: Not on file    Attends religious service: Not on file    Active member of club or organization: Not on file    Attends meetings of clubs or organizations: Not on file    Relationship status: Not on file  . Intimate partner violence:    Fear of current or ex partner: Not on file    Emotionally abused: Not on file    Physically abused: Not on file    Forced sexual activity: Not on file  Other Topics Concern  . Not on file  Social History Narrative   Widowed   Daily Caffeine Use-2 cups daily or more   Pt does not get regular exercise   Works Indust [employer], 3rd shift    Allergies  Allergen Reactions  . Procaine Other (See Comments)    Heart race  . Adhesive [Tape] Rash and Other (See Comments)    SKIN IRRITATION    Current Outpatient Medications  Medication Sig Dispense Refill  . Blood Glucose Monitoring Suppl (TRUE METRIX AIR GLUCOSE METER) W/DEVICE KIT 1 Device by Other route once. 1 kit 0  . calcium carbonate (OS-CAL) 600 MG TABS Take 1,200 mg by mouth daily.     . calcium citrate-vitamin D (CITRACAL+D) 315-200 MG-UNIT tablet Take 1 tablet by mouth 2 (two) times daily.    . insulin aspart (NOVOLOG) 100 UNIT/ML injection Inject 80 Units into the skin daily with breakfast. 3 vial 6  . INSULIN SYRINGE 1CC/29G (MONOJECT ULTRA COMFORT SYRINGE) 29G X 1/2" 1 ML MISC USED TO INJECT INSULIN SUBCUTANEOUSLY TWICE DAILY. 200 each 2  . lisinopril (PRINIVIL,ZESTRIL) 10 MG tablet TAKE 1 TABLET EVERY DAY 90 tablet 3  . omeprazole (PRILOSEC) 40 MG capsule Take 1 capsule (40 mg total) by mouth 2 (two) times daily. 180 capsule 3  . ranitidine (ZANTAC) 300 MG tablet Take 1 tablet (300 mg total) by mouth at bedtime. 90 tablet 3  . rosuvastatin  (CRESTOR) 10 MG tablet TAKE 1 TABLET EVERY DAY 90 tablet 3  . TRUE METRIX BLOOD GLUCOSE TEST test strip TEST TWO TIMES DAILY 200 each 3  . TRUEPLUS LANCETS 28G MISC TEST TWO TIMES DAILY 200 each 3  . valACYclovir (VALTREX) 1000 MG tablet Take 1 tablet (1,000 mg total) by mouth 2 (two) times daily. 10 tablet 0  . venlafaxine XR (  EFFEXOR-XR) 150 MG 24 hr capsule TAKE 1 CAPSULE (150 MG TOTAL) BY MOUTH DAILY WITH BREAKFAST. 90 capsule 11  . vitamin D, CHOLECALCIFEROL, 400 UNITS tablet Take 2,000 Units by mouth daily.     . vitamin E (VITAMIN E) 400 UNIT capsule Take 400 Units by mouth daily.    Marland Kitchen doxycycline (VIBRA-TABS) 100 MG tablet Take 1 tablet (100 mg total) by mouth 2 (two) times daily. (Patient not taking: Reported on 10/22/2017) 20 tablet 0  . triamcinolone cream (KENALOG) 0.1 % Apply 1 application topically 4 (four) times daily. As needed for itching (Patient not taking: Reported on 10/22/2017) 80 g 0   No current facility-administered medications for this visit.     ROS:   General:  No weight loss, Fever, chills  HEENT: No recent headaches, no nasal bleeding, no visual changes, no sore throat  Neurologic: No dizziness, blackouts, seizures. No recent symptoms of stroke or mini- stroke. No recent episodes of slurred speech, or temporary blindness.  Cardiac: No recent episodes of chest pain/pressure, no shortness of breath at rest.  No shortness of breath with exertion.  Denies history of atrial fibrillation or irregular heartbeat  Vascular: No history of rest pain in feet.  No history of claudication.  No history of non-healing ulcer, No history of DVT   Pulmonary: No home oxygen, no productive cough, no hemoptysis,  No asthma or wheezing  Musculoskeletal:  _0  Arthritis, _1  Low back pain,  _2  Joint pain  Hematologic:No history of hypercoagulable state.  No history of easy bleeding.  No history of anemia  Gastrointestinal: No hematochezia or melena,  No gastroesophageal reflux, no  trouble swallowing  Urinary: _3  chronic Kidney disease, _4  on HD - _5  MWF or _6  TTHS, _7  Burning with urination, _8  Frequent urination, _9  Difficulty urinating;   Skin: No rashes  Psychological: No history of anxiety,  No history of depression   Physical Examination  Vitals:   10/22/17 1216 10/22/17 1220  BP: (!) 150/79 (!) 151/76  Pulse: 96   Resp: 16   Temp: 98.3 F (36.8 C)   TempSrc: Oral   SpO2: 94%   Weight: 172 lb 8 oz (78.2 kg)   Height: _10  (1.549 m)     Body mass index is 32.59 kg/m.  General:  Alert and oriented, no acute distress HEENT: Normal Neck: No bruit or JVD Pulmonary: Clear to auscultation bilaterally Cardiac: Regular Rate and Rhythm without murmur Abdomen: Soft, non-tender, non-distended, no mass Skin: No rash, gangrene tip of right fourth toe with ulceration proximal to this encompassing most of the entire proximal phalanx Extremity Pulses:  2+ radial, brachial, absent femoral, dorsalis pedis, posterior tibial pulses bilaterally Musculoskeletal: No deformity trace pretibial edema bilaterally  Neurologic: Upper and lower extremity motor 5/5 and symmetric  DATA:  Patient had bilateral ABIs performed today which I reviewed and interpreted.  Right side ABI was 0.6 decreased from 0.96 June 2018 left side was 0.85 decreased from 1.1 in June 2018  ASSESSMENT:   Gangrenous tip of right fourth toe with threatened limb right leg most likely has occluded her right superficial femoral artery angioplasty site   PLAN: Aortogram with bilateral lower extremity runoff possible intervention scheduled for October 30, 2017.  Risk benefits possible complications of procedure details including but not limited to bleeding infection contrast reaction vessel injury were discussed with the patient today she understands and agrees to proceed.  Ruta Hinds, MD [facility] Office: [phone] Pager: [phone]

## 2017-10-22 NOTE — H&P (View-Only) (Signed)
Referring Physician: Dr Dellia Nims  Patient name: Yvonne Reynolds MRN: [id number] DOB: [birth year] Sex: female  REASON FOR CONSULT: Nonhealing wound right fourth toe  HPI: Yvonne Reynolds is a 72 y.o. female, previously known to [place].  She had a previous right superficial femoral artery angioplasty by Dr. Geryl Councilman in 2017.  She was last seen June 2018 and never returned for follow-up.  About 8 weeks ago she stubbed her toe on some gravel and the wound would not heal.  She was seen in the wound center earlier this week.  She was then referred to [place] for further evaluation.  She denies rest pain.  She does not really complain of claudication symptoms.  She cannot take aspirin secondary to severe reflux.  She states she is not smoking.  Other medical problems include diabetes and hypertension both of which are currently controlled.  Past Medical History:  Diagnosis Date  . Complication of anesthesia    states was hard to wake up after 1st EGD  . Depression   . GERD (gastroesophageal reflux disease)   . High cholesterol   . History of seizure    x 1 - due to hyperglycemia  . Hypertension    states under control with med., has been on med. > 20 yr.  . Insulin dependent diabetes mellitus ([place])   . Macular degeneration   . Nocturia   . Osteoarthritis    shoulder  . Osteoporosis   . Popping of temporomandibular joint on opening of jaw   . Sebaceous cyst 09/2014   chest wall - sternum  . Toenail fungus    Past Surgical History:  Procedure Laterality Date  . ABDOMINAL HYSTERECTOMY     partial  . BLADDER SUSPENSION    . CATARACT EXTRACTION W/ INTRAOCULAR LENS  IMPLANT, BILATERAL Bilateral   . CESAREAN SECTION    . ESOPHAGOGASTRODUODENOSCOPY  05/07/2004  . ESOPHAGOGASTRODUODENOSCOPY (EGD) WITH PROPOFOL  07/06/2013  . LOWER EXTREMITY ANGIOGRAM Bilateral 04/12/2015   Procedure: Lower Extremity Angiogram;  Surgeon: Conrad Price, MD;  Location: [facility];  Service: Cardiovascular;   Laterality: Bilateral;  . MASS EXCISION N/A 10/17/2014   Procedure: EXCISION 2X3CM SUBCUTANEOUS CHEST WALL CYST;  Surgeon: Rolm Bookbinder, MD;  Location: [facility];  Service: General;  Laterality: N/A;  . ORIF ANKLE FRACTURE Left 07/23/2012   Procedure: OPEN REDUCTION INTERNAL FIXATION (ORIF) ANKLE FRACTURE;  Surgeon: Jessy Oto, MD;  Location: [facility];  Service: Orthopedics;  Laterality: Left;  . ORIF WRIST FRACTURE Left   . PERIPHERAL VASCULAR CATHETERIZATION N/A 04/12/2015   Procedure: Abdominal Aortogram;  Surgeon: Conrad [place], MD;  Location: [facility];  Service: Cardiovascular;  Laterality: N/A;  . PERIPHERAL VASCULAR CATHETERIZATION Right 04/12/2015   Procedure: Peripheral Vascular Balloon Angioplasty;  Surgeon: Conrad , MD;  Location: [facility];  Service: Cardiovascular;  Laterality: Right;  rt sfa drug coated balloon    Family History  Problem Relation Age of Onset  . Ovarian cancer Mother   . Stroke Father   . Diabetes Sister   . Stroke Sister   . Heart disease Sister        before age 84  . Diabetes Brother   . Heart disease Brother        before age 83  . Diabetes Sister     SOCIAL HISTORY: Social History   Socioeconomic History  . Marital status: Widowed    Spouse name: Not on  file  . Number of children: 3  . Years of education: Not on file  . Highest education level: Not on file  Occupational History  . 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 Financial resource strain: Not on file  . Food insecurity:    Worry: Not on file    Inability: Not on file  . Transportation needs:    Medical: Not on file    Non-medical: Not on file  Tobacco Use  . Smoking status: Former Smoker    Packs/day: 0.00    Last attempt to quit: 11/11/2011    Years since quitting: 5.9  . Smokeless tobacco: Never Used  Substance and Sexual Activity  . Alcohol use: No  . Drug  use: No  . Sexual activity: Not on file  Lifestyle  . Physical activity:    Days per week: Not on file    Minutes per session: Not on file  . Stress: Not on file  Relationships  . Social connections:    Talks on phone: Not on file    Gets together: Not on file    Attends religious service: Not on file    Active member of club or organization: Not on file    Attends meetings of clubs or organizations: Not on file    Relationship status: Not on file  . Intimate partner violence:    Fear of current or ex partner: Not on file    Emotionally abused: Not on file    Physically abused: Not on file    Forced sexual activity: Not on file  Other Topics Concern  . Not on file  Social History Narrative   Widowed   Daily Caffeine Use-2 cups daily or more   Pt does not get regular exercise   Works [employer], 3rd shift    Allergies  Allergen Reactions  . Procaine Other (See Comments)    Heart race  . Adhesive [Tape] Rash and Other (See Comments)    SKIN IRRITATION    Current Outpatient Medications  Medication Sig Dispense Refill  . Blood Glucose Monitoring Suppl (TRUE METRIX AIR GLUCOSE METER) W/DEVICE KIT 1 Device by Other route once. 1 kit 0  . calcium carbonate (OS-CAL) 600 MG TABS Take 1,200 mg by mouth daily.     . calcium citrate-vitamin D (CITRACAL+D) 315-200 MG-UNIT tablet Take 1 tablet by mouth 2 (two) times daily.    . insulin aspart (NOVOLOG) 100 UNIT/ML injection Inject 80 Units into the skin daily with breakfast. 3 vial 6  . INSULIN SYRINGE 1CC/29G (MONOJECT ULTRA COMFORT SYRINGE) 29G X 1/2" 1 ML MISC USED TO INJECT INSULIN SUBCUTANEOUSLY TWICE DAILY. 200 each 2  . lisinopril (PRINIVIL,ZESTRIL) 10 MG tablet TAKE 1 TABLET EVERY DAY 90 tablet 3  . omeprazole (PRILOSEC) 40 MG capsule Take 1 capsule (40 mg total) by mouth 2 (two) times daily. 180 capsule 3  . ranitidine (ZANTAC) 300 MG tablet Take 1 tablet (300 mg total) by mouth at bedtime. 90 tablet 3  . rosuvastatin  (CRESTOR) 10 MG tablet TAKE 1 TABLET EVERY DAY 90 tablet 3  . TRUE METRIX BLOOD GLUCOSE TEST test strip TEST TWO TIMES DAILY 200 each 3  . TRUEPLUS LANCETS 28G MISC TEST TWO TIMES DAILY 200 each 3  . valACYclovir (VALTREX) 1000 MG tablet Take 1 tablet (1,000 mg total) by mouth 2 (two) times daily. 10 tablet 0  . venlafaxine XR (  EFFEXOR-XR) 150 MG 24 hr capsule TAKE 1 CAPSULE (150 MG TOTAL) BY MOUTH DAILY WITH BREAKFAST. 90 capsule 11  . vitamin D, CHOLECALCIFEROL, 400 UNITS tablet Take 2,000 Units by mouth daily.     . vitamin E (VITAMIN E) 400 UNIT capsule Take 400 Units by mouth daily.    Marland Kitchen doxycycline (VIBRA-TABS) 100 MG tablet Take 1 tablet (100 mg total) by mouth 2 (two) times daily. (Patient not taking: Reported on 10/22/2017) 20 tablet 0  . triamcinolone cream (KENALOG) 0.1 % Apply 1 application topically 4 (four) times daily. As needed for itching (Patient not taking: Reported on 10/22/2017) 80 g 0   No current facility-administered medications for this visit.     ROS:   General:  No weight loss, Fever, chills  HEENT: No recent headaches, no nasal bleeding, no visual changes, no sore throat  Neurologic: No dizziness, blackouts, seizures. No recent symptoms of stroke or mini- stroke. No recent episodes of slurred speech, or temporary blindness.  Cardiac: No recent episodes of chest pain/pressure, no shortness of breath at rest.  No shortness of breath with exertion.  Denies history of atrial fibrillation or irregular heartbeat  Vascular: No history of rest pain in feet.  No history of claudication.  No history of non-healing ulcer, No history of DVT   Pulmonary: No home oxygen, no productive cough, no hemoptysis,  No asthma or wheezing  Musculoskeletal:  _0  Arthritis, _1  Low back pain,  _2  Joint pain  Hematologic:No history of hypercoagulable state.  No history of easy bleeding.  No history of anemia  Gastrointestinal: No hematochezia or melena,  No gastroesophageal reflux, no  trouble swallowing  Urinary: _3  chronic Kidney disease, _4  on HD - _5  MWF or _6  TTHS, _7  Burning with urination, _8  Frequent urination, _9  Difficulty urinating;   Skin: No rashes  Psychological: No history of anxiety,  No history of depression   Physical Examination  Vitals:   10/22/17 1216 10/22/17 1220  BP: (!) 150/79 (!) 151/76  Pulse: 96   Resp: 16   Temp: 98.3 F (36.8 C)   TempSrc: Oral   SpO2: 94%   Weight: 172 lb 8 oz (78.2 kg)   Height: _10  (1.549 m)     Body mass index is 32.59 kg/m.  General:  Alert and oriented, no acute distress HEENT: Normal Neck: No bruit or JVD Pulmonary: Clear to auscultation bilaterally Cardiac: Regular Rate and Rhythm without murmur Abdomen: Soft, non-tender, non-distended, no mass Skin: No rash, gangrene tip of right fourth toe with ulceration proximal to this encompassing most of the entire proximal phalanx Extremity Pulses:  2+ radial, brachial, absent femoral, dorsalis pedis, posterior tibial pulses bilaterally Musculoskeletal: No deformity trace pretibial edema bilaterally  Neurologic: Upper and lower extremity motor 5/5 and symmetric  DATA:  Patient had bilateral ABIs performed today which I reviewed and interpreted.  Right side ABI was 0.6 decreased from 0.96 June 2018 left side was 0.85 decreased from 1.1 in June 2018  ASSESSMENT:   Gangrenous tip of right fourth toe with threatened limb right leg most likely has occluded her right superficial femoral artery angioplasty site   PLAN: Aortogram with bilateral lower extremity runoff possible intervention scheduled for October 30, 2017.  Risk benefits possible complications of procedure details including but not limited to bleeding infection contrast reaction vessel injury were discussed with the patient today she understands and agrees to proceed.  Ruta Hinds, MD [facility] Office: [phone] Pager: [phone]

## 2017-10-28 ENCOUNTER — Encounter (HOSPITAL_BASED_OUTPATIENT_CLINIC_OR_DEPARTMENT_OTHER): Payer: Medicare HMO | Attending: Physician Assistant

## 2017-10-30 ENCOUNTER — Ambulatory Visit (HOSPITAL_COMMUNITY)
Admission: RE | Admit: 2017-10-30 | Discharge: 2017-10-30 | Disposition: A | Discharge status: Home / Self Care | Payer: Medicare HMO | Source: Ambulatory Visit | Attending: Vascular Surgery | Admitting: Vascular Surgery

## 2017-10-30 ENCOUNTER — Other Ambulatory Visit: Payer: Self-pay

## 2017-10-30 ENCOUNTER — Encounter (HOSPITAL_COMMUNITY)
Admission: RE | Disposition: A | Discharge status: Home / Self Care | Payer: Self-pay | Source: Ambulatory Visit | Attending: Vascular Surgery

## 2017-10-30 ENCOUNTER — Telehealth: Payer: Self-pay | Admitting: Vascular Surgery

## 2017-10-30 DIAGNOSIS — K219 Gastro-esophageal reflux disease without esophagitis: Secondary | ICD-10-CM | POA: Diagnosis not present

## 2017-10-30 DIAGNOSIS — I70261 Atherosclerosis of native arteries of extremities with gangrene, right leg: Secondary | ICD-10-CM | POA: Insufficient documentation

## 2017-10-30 DIAGNOSIS — I10 Essential (primary) hypertension: Secondary | ICD-10-CM | POA: Diagnosis not present

## 2017-10-30 DIAGNOSIS — F329 Major depressive disorder, single episode, unspecified: Secondary | ICD-10-CM | POA: Insufficient documentation

## 2017-10-30 DIAGNOSIS — E1152 Type 2 diabetes mellitus with diabetic peripheral angiopathy with gangrene: Secondary | ICD-10-CM | POA: Diagnosis not present

## 2017-10-30 DIAGNOSIS — Z87891 Personal history of nicotine dependence: Secondary | ICD-10-CM | POA: Insufficient documentation

## 2017-10-30 DIAGNOSIS — M810 Age-related osteoporosis without current pathological fracture: Secondary | ICD-10-CM | POA: Insufficient documentation

## 2017-10-30 DIAGNOSIS — E7800 Pure hypercholesterolemia, unspecified: Secondary | ICD-10-CM | POA: Diagnosis not present

## 2017-10-30 DIAGNOSIS — Z794 Long term (current) use of insulin: Secondary | ICD-10-CM | POA: Diagnosis not present

## 2017-10-30 DIAGNOSIS — M1990 Unspecified osteoarthritis, unspecified site: Secondary | ICD-10-CM | POA: Diagnosis not present

## 2017-10-30 HISTORY — PX: PERIPHERAL VASCULAR BALLOON ANGIOPLASTY: CATH118281

## 2017-10-30 HISTORY — PX: ABDOMINAL AORTOGRAM W/LOWER EXTREMITY: CATH118223

## 2017-10-30 LAB — POCT I-STAT, CHEM 8
BUN: 23 mg/dL (ref 8–23)
Calcium, Ion: 1.22 mmol/L (ref 1.15–1.40)
Chloride: 100 mmol/L (ref 98–111)
Creatinine, Ser: 0.8 mg/dL (ref 0.44–1.00)
Glucose, Bld: 277 mg/dL — ABNORMAL HIGH (ref 70–99)
HCT: 44 % (ref 36.0–46.0)
Hemoglobin: 15 g/dL (ref 12.0–15.0)
Potassium: 4.5 mmol/L (ref 3.5–5.1)
Sodium: 136 mmol/L (ref 135–145)
TCO2: 29 mmol/L (ref 22–32)

## 2017-10-30 LAB — POCT ACTIVATED CLOTTING TIME
Activated Clotting Time: 219 seconds
Activated Clotting Time: 158 seconds

## 2017-10-30 LAB — GLUCOSE, CAPILLARY: GLUCOSE-CAPILLARY: 304 mg/dL — AB (ref 70–99)

## 2017-10-30 SURGERY — ABDOMINAL AORTOGRAM W/LOWER EXTREMITY
Anesthesia: LOCAL | Laterality: Bilateral

## 2017-10-30 MED ORDER — ONDANSETRON HCL 4 MG/5ML PO SOLN
ORAL | Status: DC | PRN
  Administered 2017-10-30: 4 mg

## 2017-10-30 MED ORDER — ASPIRIN EC 81 MG PO TBEC: 81.0000 mg | DELAYED_RELEASE_TABLET | Freq: Every day | ORAL | 2 refills | Status: DC

## 2017-10-30 MED ORDER — SODIUM CHLORIDE 0.9 % IV SOLN: INTRAVENOUS | Status: AC

## 2017-10-30 MED ORDER — SODIUM CHLORIDE 0.9 % IV SOLN: 250.0000 mL | INTRAVENOUS | Status: DC | PRN

## 2017-10-30 MED ORDER — HYDRALAZINE HCL 20 MG/ML IJ SOLN
5.0000 mg | INTRAMUSCULAR | Status: DC | PRN
  Administered 2017-10-30: 5 mg via INTRAVENOUS

## 2017-10-30 MED ORDER — LIDOCAINE HCL (PF) 1 % IJ SOLN
INTRAMUSCULAR | Status: DC | PRN
  Administered 2017-10-30: 18 mL

## 2017-10-30 MED ORDER — HEPARIN (PORCINE) IN NACL 1000-0.9 UT/500ML-% IV SOLN
INTRAVENOUS | Status: AC
  Filled 2017-10-30: qty 1000

## 2017-10-30 MED ORDER — HYDRALAZINE HCL 20 MG/ML IJ SOLN
INTRAMUSCULAR | Status: AC
  Filled 2017-10-30: qty 1

## 2017-10-30 MED ORDER — SODIUM CHLORIDE 0.9 % IV SOLN
INTRAVENOUS | Status: DC
  Administered 2017-10-30: 08:00:00 via INTRAVENOUS

## 2017-10-30 MED ORDER — LABETALOL HCL 5 MG/ML IV SOLN: 10.0000 mg | INTRAVENOUS | Status: DC | PRN

## 2017-10-30 MED ORDER — LABETALOL HCL 5 MG/ML IV SOLN
INTRAVENOUS | Status: AC
  Filled 2017-10-30: qty 4

## 2017-10-30 MED ORDER — MORPHINE SULFATE (PF) 10 MG/ML IV SOLN: 2.0000 mg | INTRAVENOUS | Status: DC | PRN

## 2017-10-30 MED ORDER — IODIXANOL 320 MG/ML IV SOLN
INTRAVENOUS | Status: DC | PRN
  Administered 2017-10-30: 140 mL via INTRA_ARTERIAL

## 2017-10-30 MED ORDER — SODIUM CHLORIDE 0.9% FLUSH: 3.0000 mL | INTRAVENOUS | Status: DC | PRN

## 2017-10-30 MED ORDER — CLOPIDOGREL BISULFATE 75 MG PO TABS: 75.0000 mg | ORAL_TABLET | Freq: Every day | ORAL | 11 refills | Status: DC

## 2017-10-30 MED ORDER — LABETALOL HCL 5 MG/ML IV SOLN
INTRAVENOUS | Status: DC | PRN
  Administered 2017-10-30 (×2): 10 mg via INTRAVENOUS

## 2017-10-30 MED ORDER — SODIUM CHLORIDE 0.9% FLUSH: 3.0000 mL | Freq: Two times a day (BID) | INTRAVENOUS | Status: DC

## 2017-10-30 MED ORDER — ACETAMINOPHEN 325 MG PO TABS: 650.0000 mg | ORAL_TABLET | ORAL | Status: DC | PRN

## 2017-10-30 MED ORDER — HEPARIN (PORCINE) IN NACL 1000-0.9 UT/500ML-% IV SOLN
INTRAVENOUS | Status: DC | PRN
  Administered 2017-10-30: 500 mL

## 2017-10-30 MED ORDER — LIDOCAINE HCL (PF) 1 % IJ SOLN
INTRAMUSCULAR | Status: AC
  Filled 2017-10-30: qty 30

## 2017-10-30 MED ORDER — HEPARIN SODIUM (PORCINE) 1000 UNIT/ML IJ SOLN
INTRAMUSCULAR | Status: AC
  Filled 2017-10-30: qty 1

## 2017-10-30 MED ORDER — ONDANSETRON HCL 4 MG/2ML IJ SOLN
INTRAMUSCULAR | Status: AC
  Filled 2017-10-30: qty 2

## 2017-10-30 MED ORDER — ONDANSETRON HCL 4 MG/2ML IJ SOLN: 4.0000 mg | Freq: Four times a day (QID) | INTRAMUSCULAR | Status: DC | PRN

## 2017-10-30 MED ORDER — HEPARIN SODIUM (PORCINE) 1000 UNIT/ML IJ SOLN
INTRAMUSCULAR | Status: DC | PRN
  Administered 2017-10-30: 8000 [IU] via INTRAVENOUS

## 2017-10-30 MED ORDER — OXYCODONE HCL 5 MG PO TABS: 5.0000 mg | ORAL_TABLET | ORAL | Status: DC | PRN

## 2017-10-30 SURGICAL SUPPLY — 19 items
BALLN LUTONIX DCB 6X40X130 (BALLOONS) ×3
BALLN MUSTANG 4X20X135 (BALLOONS) ×3
BALLOON LUTONIX DCB 6X40X130 (BALLOONS) IMPLANT
BALLOON MUSTANG 4X20X135 (BALLOONS) IMPLANT
CATH ANGIO 5F PIGTAIL 65CM (CATHETERS) ×1 IMPLANT
CATH CROSS OVER TEMPO 5F (CATHETERS) ×1 IMPLANT
DEVICE CONTINUOUS FLUSH (MISCELLANEOUS) ×1 IMPLANT
KIT ENCORE 26 ADVANTAGE (KITS) ×1 IMPLANT
KIT PV (KITS) ×3 IMPLANT
SHEATH PINNACLE 5F 10CM (SHEATH) ×1 IMPLANT
SHEATH PINNACLE 7F 10CM (SHEATH) ×1 IMPLANT
SHEATH PINNACLE ST 7F 45CM (SHEATH) ×1 IMPLANT
SHEATH PROBE COVER 6X72 (BAG) ×1 IMPLANT
SYR MEDRAD MARK V 150ML (SYRINGE) ×3 IMPLANT
TRANSDUCER W/STOPCOCK (MISCELLANEOUS) ×3 IMPLANT
TRAY PV CATH (CUSTOM PROCEDURE TRAY) ×3 IMPLANT
TUBING CIL FLEX 10 FLL-RA (TUBING) ×1 IMPLANT
WIRE HITORQ VERSACORE ST 145CM (WIRE) ×1 IMPLANT
WIRE VERSACORE LOC 115CM (WIRE) ×1 IMPLANT

## 2017-10-30 NOTE — Telephone Encounter (Signed)
sch appt lvm 11/19/17 1045am f/u MD

## 2017-10-30 NOTE — Progress Notes (Signed)
Site area: left groin fa sheath Site Prior to Removal:  Level 0 Pressure Applied For: 25 minutes Manual:   yes Patient Status During Pull:  stable Post Pull Site:  Level  0 Post Pull Instructions Given:  yes Post Pull Pulses Present: left pt dopplered Dressing Applied:  Gauze and tegaderm Bedrest begins @ 1125 Comments:

## 2017-10-30 NOTE — Op Note (Addendum)
Procedure: Ultrasound left groin, abdominal aortogram bilateral lower extremity runoff drug-coated balloon angioplasty right superficial femoral artery  Preoperative diagnosis: Gangrene right fourth toe  Postoperative diagnosis: Same  Anesthesia: Local  Operative findings: Number 190% mid short segment right SFA stenosis treated with drug coated balloon to 0% residual stenosis (6 x 40)  Operative details: After obtaining informed consent, the patient was taken the PV lab.  The patient was placed in supine position Angio table.  Both groins were prepped and draped in usual sterile fashion.  Ultrasound was used to identify the left common femoral artery and femoral bifurcation.  Local anesthesia was infiltrated over this.  The femoral bifurcation of the common femoral artery were noted to be patent under ultrasound.  A hardcopy image was obtained to be placed in the patient's permanent medical record.  Using ultrasound guidance the left common femoral artery was successfully cannulated and an 035 versacore wire threaded up the abdominal aorta under fluoroscopic guidance.  Next a 5 French sheath was placed over the guidewire in the left common femoral artery.  This was thoroughly flushed with heparinized saline.  A 5 French pigtail catheter was then advanced over the guidewire in the abdominal aorta.  Abdominal aortogram was obtained in AP projection.  The left and right renal arteries are patent.  The infrarenal abdominal aorta is patent.  The left and right common external and internal iliac arteries are all widely patent.  At this point the pigtail catheter was pulled down just above the aortic bifurcation.  Bilateral lower extremity runoff views were then obtained through the sheath.  In the right lower extremity, the right common femoral profunda femoris and superficial femoral arteries are patent.  However there is a 90% stenosis of the proximal third of the right superficial femoral artery that  extends over about 2 cm.  Below that the remainder the SFA popliteal and all 3 tibial vessels are patent.  In the left lower extremity the left common femoral fundal femoris and superficial femoral arteries are patent.  There is a 75% stenosis of the popliteal artery just behind the knee joint.  Otherwise there is three-vessel runoff to the left foot.  At this point it was decided to intervene on the right superficial femoral artery stenosis.  The pedal catheter was removed over guidewire and replaced with a 5 [place] crossover catheter.  This was used as likely catheterize the right common iliac artery and the guidewire advanced down into the right superficial femoral artery.  The 5 French sheath was removed and replaced with a 7 [place] destination sheath was was advanced up and over the aortic bifurcation and the proximal superficial femoral artery.  This was thoroughly flushed with heparinized saline.  The patient was given 8000 units of intravenous heparin.  ACT was found to be greater than 200.  Guidewire easily advanced across the stenosis at this point the lesion was predilated with a 4 mm x 2 cm angioplasty balloon inflated to 10 atm nominal pressure for 1 minute.  A loop tonics drug-coated balloon 6 mm x 4 cm was then brought up in the operative field and advanced and centered on the lesion and inflated to 7 atm nominal pressure for 3 minutes.  Completion angiogram in 2 different views showed wide patency of the SFA with no residual stenosis or dissection.  At this point the guidewire and sheath were pulled back the long sheath was exchanged for a 7 French short sheath.  We then performed some additional  views and a lateral foot view of the left leg to make sure that we saw all tibial vessels in the left leg completely.  This again confirmed three-vessel runoff to the left foot.  At this point the procedure was concluded.  The sheath was left in place to be pulled in the holding area.  Operative  management: The patient now has in-line flow to the right leg with three-vessel runoff.  She still has gangrene on the tip of her toe on the right foot.  We will give this a few weeks to see if it heals.  If not she will need amputation of the distal segment of her right toe.  She will be maintained on Plavix and aspirin.  Ruta Hinds, MD [facility] Office: [phone] Pager: [phone]

## 2017-10-30 NOTE — Discharge Instructions (Signed)

## 2017-10-30 NOTE — Interval H&P Note (Signed)
History and Physical Interval Note:  10/30/2017 8:04 AM  Yvonne Reynolds  has presented today for surgery, with the diagnosis of PAD  The various methods of treatment have been discussed with the patient and family. After consideration of risks, benefits and other options for treatment, the patient has consented to  Procedure(s): LOWER EXTREMITY ANGIOGRAPHY (Bilateral) as a surgical intervention .  The patient's history has been reviewed, patient examined, no change in status, stable for surgery.  I have reviewed the patient's chart and labs.  Questions were answered to the patient's satisfaction.     Ruta Hinds

## 2017-10-30 NOTE — Progress Notes (Signed)
Pt states she is unaware that she needed someone to stay with her for 24 hrs.  States her grandson Coralyn Mark would be the person.  Pt does not have her cell phone and only number on file  For Coralyn Mark, he is not answering.  Left Voice mail on phone for Coralyn Mark to call back.  Gwynne Edinger aware of this and also her CBG of [phone]

## 2017-10-30 NOTE — Progress Notes (Signed)
Pt ambulated without difficulty. Site [employer].  Waiting for grandson to pick her up.   Yvonne Reynolds states that he can stay with her for 24 hrs

## 2017-11-02 ENCOUNTER — Encounter (HOSPITAL_COMMUNITY): Payer: Self-pay | Admitting: Vascular Surgery

## 2017-11-10 ENCOUNTER — Encounter: Payer: Self-pay | Admitting: Endocrinology

## 2017-11-10 ENCOUNTER — Ambulatory Visit (INDEPENDENT_AMBULATORY_CARE_PROVIDER_SITE_OTHER): Payer: Medicare HMO | Admitting: Endocrinology

## 2017-11-10 VITALS — BP 118/60 | HR 98 | Ht 61.0 in | Wt 165.8 lb

## 2017-11-10 DIAGNOSIS — Z794 Long term (current) use of insulin: Secondary | ICD-10-CM

## 2017-11-10 DIAGNOSIS — L97519 Non-pressure chronic ulcer of other part of right foot with unspecified severity: Secondary | ICD-10-CM

## 2017-11-10 DIAGNOSIS — R1013 Epigastric pain: Secondary | ICD-10-CM | POA: Diagnosis not present

## 2017-11-10 DIAGNOSIS — E1151 Type 2 diabetes mellitus with diabetic peripheral angiopathy without gangrene: Secondary | ICD-10-CM | POA: Diagnosis not present

## 2017-11-10 LAB — BASIC METABOLIC PANEL
BUN: 25 mg/dL — ABNORMAL HIGH (ref 6–23)
CALCIUM: 9.5 mg/dL (ref 8.4–10.5)
CO2: 26 meq/L (ref 19–32)
Chloride: 97 mEq/L (ref 96–112)
Creatinine, Ser: 1.07 mg/dL (ref 0.40–1.20)
GFR: 53.8 mL/min — ABNORMAL LOW (ref >60.00)
Glucose, Bld: 381 mg/dL — ABNORMAL HIGH (ref 70–99)
POTASSIUM: 3.9 meq/L (ref 3.5–5.1)
SODIUM: 136 meq/L (ref 135–145)

## 2017-11-10 LAB — URINALYSIS, ROUTINE W REFLEX MICROSCOPIC
Bilirubin Urine: NEGATIVE
Ketones, ur: NEGATIVE
Leukocytes, UA: NEGATIVE
NITRITE: NEGATIVE
Specific Gravity, Urine: <=1.005 — AB (ref 1.000–1.030)
Total Protein, Urine: 100 — AB
URINE GLUCOSE: 100 — AB
Urobilinogen, UA: 0.2 (ref 0.0–1.0)
pH: 6 (ref 5.0–8.0)

## 2017-11-10 LAB — CBC WITH DIFFERENTIAL/PLATELET
BASOS ABS: 0.1 10*3/uL (ref 0.0–0.1)
Basophils Relative: 0.5 % (ref 0.0–3.0)
EOS ABS: 0 10*3/uL (ref 0.0–0.7)
Eosinophils Relative: 0.2 % (ref 0.0–5.0)
HEMATOCRIT: 39.4 % (ref 36.0–46.0)
Hemoglobin: 12.7 g/dL (ref 12.0–15.0)
LYMPHS PCT: 15.3 % (ref 12.0–46.0)
Lymphs Abs: 1.6 10*3/uL (ref 0.7–4.0)
MCHC: 32.1 g/dL (ref 30.0–36.0)
MCV: 87 fl (ref 78.0–100.0)
MONO ABS: 0.6 10*3/uL (ref 0.1–1.0)
Monocytes Relative: 5.7 % (ref 3.0–12.0)
NEUTROS ABS: 8.3 10*3/uL — AB (ref 1.4–7.7)
NEUTROS PCT: 78.3 % — AB (ref 43.0–77.0)
PLATELETS: 493 10*3/uL — AB (ref 150.0–400.0)
RBC: 4.53 Mil/uL (ref 3.87–5.11)
RDW: 14.3 % (ref 11.5–15.5)
WBC: 10.6 10*3/uL — ABNORMAL HIGH (ref 4.0–10.5)

## 2017-11-10 LAB — HEPATIC FUNCTION PANEL
ALT: 16 U/L (ref 0–35)
AST: 13 U/L (ref 0–37)
Albumin: 3.8 g/dL (ref 3.5–5.2)
Alkaline Phosphatase: 109 U/L (ref 39–117)
BILIRUBIN DIRECT: 0.1 mg/dL (ref 0.0–0.3)
Total Bilirubin: 0.4 mg/dL (ref 0.2–1.2)
Total Protein: 7.1 g/dL (ref 6.0–8.3)

## 2017-11-10 LAB — AMYLASE: Amylase: 36 U/L (ref 27–131)

## 2017-11-10 LAB — POCT GLYCOSYLATED HEMOGLOBIN (HGB A1C): Hemoglobin A1C: 10.3 % — AB (ref 4.0–5.6)

## 2017-11-10 NOTE — Progress Notes (Signed)
Subjective:    Patient ID: Yvonne Reynolds, female    DOB: Feb 07, 1945, 72 y.o.   MRN: [id number]  HPI 1 week of moderate pain at the epigast area, and assoc intermitt n/v.  She says sxs might be due to plavix.   Past Medical History:  Diagnosis Date  . Complication of anesthesia    states was hard to wake up after 1st EGD  . Depression   . GERD (gastroesophageal reflux disease)   . High cholesterol   . History of seizure    x 1 - due to hyperglycemia  . Hypertension    states under control with med., has been on med. > 20 yr.  . Insulin dependent diabetes mellitus ([place])   . Macular degeneration   . Nocturia   . Osteoarthritis    shoulder  . Osteoporosis   . Popping of temporomandibular joint on opening of jaw   . Sebaceous cyst 09/2014   chest wall - sternum  . Toenail fungus     Past Surgical History:  Procedure Laterality Date  . ABDOMINAL AORTOGRAM W/LOWER EXTREMITY Bilateral 10/30/2017   Procedure: ABDOMINAL AORTOGRAM W/LOWER EXTREMITY;  Surgeon: Elam Dutch, MD;  Location: [facility];  Service: Cardiovascular;  Laterality: Bilateral;  . ABDOMINAL HYSTERECTOMY     partial  . BLADDER SUSPENSION    . CATARACT EXTRACTION W/ INTRAOCULAR LENS  IMPLANT, BILATERAL Bilateral   . CESAREAN SECTION    . ESOPHAGOGASTRODUODENOSCOPY  05/07/2004  . ESOPHAGOGASTRODUODENOSCOPY (EGD) WITH PROPOFOL  07/06/2013  . LOWER EXTREMITY ANGIOGRAM Bilateral 04/12/2015   Procedure: Lower Extremity Angiogram;  Surgeon: Conrad Norlina, MD;  Location: [facility];  Service: Cardiovascular;  Laterality: Bilateral;  . MASS EXCISION N/A 10/17/2014   Procedure: EXCISION 2X3CM SUBCUTANEOUS CHEST WALL CYST;  Surgeon: Rolm Bookbinder, MD;  Location: [facility];  Service: General;  Laterality: N/A;  . ORIF ANKLE FRACTURE Left 07/23/2012   Procedure: OPEN REDUCTION INTERNAL FIXATION (ORIF) ANKLE FRACTURE;  Surgeon: Jessy Oto, MD;  Location: [facility];  Service: Orthopedics;   Laterality: Left;  . ORIF WRIST FRACTURE Left   . PERIPHERAL VASCULAR BALLOON ANGIOPLASTY  10/30/2017   Procedure: PERIPHERAL VASCULAR BALLOON ANGIOPLASTY;  Surgeon: Elam Dutch, MD;  Location: [facility];  Service: Cardiovascular;;  rt sfa  . PERIPHERAL VASCULAR CATHETERIZATION N/A 04/12/2015   Procedure: Abdominal Aortogram;  Surgeon: Conrad Amarillo, MD;  Location: [facility];  Service: Cardiovascular;  Laterality: N/A;  . PERIPHERAL VASCULAR CATHETERIZATION Right 04/12/2015   Procedure: Peripheral Vascular Balloon Angioplasty;  Surgeon: Conrad [place]ows, MD;  Location: [facility];  Service: Cardiovascular;  Laterality: Right;  rt sfa drug coated balloon    Social History   Socioeconomic History  . Marital status: Widowed    Spouse name: Not on file  . Number of children: 3  . Years of education: Not on file  . Highest education level: Not on file  Occupational History  . 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 Financial resource strain: Not on file  . Food insecurity:    Worry: Not on file    Inability: Not on file  . Transportation needs:    Medical: Not on file    Non-medical: Not on file  Tobacco Use  . Smoking status: Former Smoker    Packs/day: 0.00    Last attempt to  quit: 11/11/2011    Years since quitting: 6.0  . Smokeless tobacco: Never Used  Substance and Sexual Activity  . Alcohol use: No  . Drug use: No  . Sexual activity: Not on file  Lifestyle  . Physical activity:    Days per week: Not on file    Minutes per session: Not on file  . Stress: Not on file  Relationships  . Social connections:    Talks on phone: Not on file    Gets together: Not on file    Attends religious service: Not on file    Active member of club or organization: Not on file    Attends meetings of clubs or organizations: Not on file    Relationship status: Not on file  . Intimate partner  violence:    Fear of current or ex partner: Not on file    Emotionally abused: Not on file    Physically abused: Not on file    Forced sexual activity: Not on file  Other Topics Concern  . Not on file  Social History Narrative   Widowed   Daily Caffeine Use-2 cups daily or more   Pt does not get regular exercise   Works [employer], 3rd shift    Current Outpatient Medications on File Prior to Visit  Medication Sig Dispense Refill  . aspirin EC 81 MG tablet Take 1 tablet (81 mg total) by mouth daily. 150 tablet 2  . Blood Glucose Monitoring Suppl (TRUE METRIX AIR GLUCOSE METER) W/DEVICE KIT 1 Device by Other route once. 1 kit 0  . CALCIUM CITRATE-VITAMIN D PO Take 2 tablets by mouth daily.     . cholecalciferol (VITAMIN D) 1000 units tablet Take 2,000 Units by mouth daily.     . clopidogrel (PLAVIX) 75 MG tablet Take 1 tablet (75 mg total) by mouth daily. 30 tablet 11  . insulin aspart (NOVOLOG) 100 UNIT/ML injection Inject 80 Units into the skin daily with breakfast. 3 vial 6  . INSULIN SYRINGE 1CC/29G (MONOJECT ULTRA COMFORT SYRINGE) 29G X 1/2" 1 ML MISC USED TO INJECT INSULIN SUBCUTANEOUSLY TWICE DAILY. 200 each 2  . lisinopril (PRINIVIL,ZESTRIL) 10 MG tablet TAKE 1 TABLET EVERY DAY (Patient taking differently: Take 10 mg by mouth daily. ) 90 tablet 3  . omeprazole (PRILOSEC) 40 MG capsule Take 1 capsule (40 mg total) by mouth 2 (two) times daily. 180 capsule 3  . ranitidine (ZANTAC) 300 MG tablet Take 1 tablet (300 mg total) by mouth at bedtime. 90 tablet 3  . rosuvastatin (CRESTOR) 10 MG tablet TAKE 1 TABLET EVERY DAY (Patient taking differently: Take 10 mg by mouth daily. ) 90 tablet 3  . triamcinolone cream (KENALOG) 0.1 % Apply 1 application topically 4 (four) times daily. As needed for itching 80 g 0  . TRUE METRIX BLOOD GLUCOSE TEST test strip TEST TWO TIMES DAILY 200 each 3  . TRUEPLUS LANCETS 28G MISC TEST TWO TIMES DAILY 200 each 3  . venlafaxine XR (EFFEXOR-XR) 150 MG 24 hr  capsule TAKE 1 CAPSULE (150 MG TOTAL) BY MOUTH DAILY WITH BREAKFAST. 90 capsule 11  . vitamin E (VITAMIN E) 400 UNIT capsule Take 400 Units by mouth daily.     No current facility-administered medications on file prior to visit.     Allergies  Allergen Reactions  . Procaine Other (See Comments)    Heart race  . Adhesive [Tape] Rash and Other (See Comments)    SKIN IRRITATION    Family  History  Problem Relation Age of Onset  . Ovarian cancer Mother   . Stroke Father   . Diabetes Sister   . Stroke Sister   . Heart disease Sister        before age 62  . Diabetes Brother   . Heart disease Brother        before age 37  . Diabetes Sister     BP 118/60 (BP Location: Left Arm, Patient Position: Sitting, Cuff Size: Normal)   Pulse 98   Ht 5' 1" (1.549 m)   Wt 165 lb 12.8 oz (75.2 kg)   SpO2 94%   BMI 31.33 kg/m    Review of Systems Denies diarrhea, hematuria, and BRBPR    Objective:   Physical Exam VITAL SIGNS:  See vs page GENERAL: no distress ABDOMEN: abdomen is soft, nontender.  no hepatosplenomegaly.  not distended.  no hernia Right 4th toe: shallow ulcer is again noted.        Assessment & Plan:  abd pain, new, uncertain etiology Toe ulcer, persistent.  Patient Instructions  blood tests are requested for you today.  We'll let you know about the results.  Please try stopping the plaxix for a few days, on a trial basis.  Please call or message [place] in a few days, to tell us how the blood sugar is doing.  Please see a wound care specialist.  you will receive a phone call, about a day and time for an appointment.   Please come back for a regular physical appointment in 2 weeks.

## 2017-11-10 NOTE — Patient Instructions (Addendum)
blood tests are requested for you today.  We'll let you know about the results.  Please try stopping the plaxix for a few days, on a trial basis.  Please call or message [place] in a few days, to tell us how the blood sugar is doing.  Please see a wound care specialist.  you will receive a phone call, about a day and time for an appointment.   Please come back for a regular physical appointment in 2 weeks.

## 2017-11-11 ENCOUNTER — Telehealth: Payer: Self-pay

## 2017-11-11 ENCOUNTER — Telehealth: Payer: Self-pay | Admitting: Endocrinology

## 2017-11-11 NOTE — Telephone Encounter (Signed)
-----   Message from Renato Shin, MD sent at 11/10/2017  6:41 PM EDT ----- please call patient: Good results, except the blood sugar. When you come back next time, we'll work more on the blood sugar. However, if the blood sugar is staying over 200, please let us know, so we can increase the insulin in the meantime.

## 2017-11-11 NOTE — Telephone Encounter (Signed)
Called pt and informed of lab results and plan to address blood sugar. LVM requesting returned call.

## 2017-11-11 NOTE — Telephone Encounter (Signed)
Patient is returning call. Please Advise. °

## 2017-11-11 NOTE — Telephone Encounter (Signed)
Pt returned call. Informed of lab results. Advised to call if CBG > 200 for possible insulin adjustment. Otherwise, will f/u with pt re: diabetes at next OV. Verbalized acceptance and understanding.

## 2017-11-19 ENCOUNTER — Encounter: Payer: Self-pay | Admitting: Vascular Surgery

## 2017-11-19 ENCOUNTER — Ambulatory Visit: Payer: Medicare HMO | Admitting: Vascular Surgery

## 2017-11-27 ENCOUNTER — Ambulatory Visit (INDEPENDENT_AMBULATORY_CARE_PROVIDER_SITE_OTHER): Payer: Medicare HMO | Admitting: Endocrinology

## 2017-11-27 ENCOUNTER — Encounter: Payer: Self-pay | Admitting: Endocrinology

## 2017-11-27 VITALS — BP 132/70 | HR 92 | Ht 61.0 in | Wt 170.0 lb

## 2017-11-27 DIAGNOSIS — E1151 Type 2 diabetes mellitus with diabetic peripheral angiopathy without gangrene: Secondary | ICD-10-CM | POA: Diagnosis not present

## 2017-11-27 DIAGNOSIS — Z794 Long term (current) use of insulin: Secondary | ICD-10-CM | POA: Diagnosis not present

## 2017-11-27 NOTE — Patient Instructions (Addendum)
Keep the toe ulcer covered with antibiotic ointment and a bandaid.  check your blood sugar twice a day.  vary the time of day when you check, between before the 3 meals, and at bedtime.  also check if you have symptoms of your blood sugar being too high or too low.  please keep a record of the readings and bring it to your next appointment here (or you can bring the meter itself).  You can write it on any piece of paper.  please call us sooner if your blood sugar goes below 70, or if you have a lot of readings over 200.   It is unusual for someone to need breakfast insulin only, so I hesitate to increase it until we have more information about your blood sugar.   Please come back for a follow-up appointment in 2 weeks.

## 2017-11-27 NOTE — Progress Notes (Signed)
Subjective:    Patient ID: Yvonne Reynolds, female    DOB: 1945-03-10, 72 y.o.   MRN: [id number]  HPI Pt returns for f/u of diabetes mellitus: DM type: Insulin-requiring type 2 Dx'ed: 6834 Complications: retinopathy and nephropathy.   Therapy: insulin since 1999 GDM: never.  DKA: never.   Severe hypoglycemia: once, in mid-2017.   Pancreatitis: never.  Other: she declines multiple daily injections.  She takes she takes human insulin, due to cost.  She takes Reg QAM; the pattern of her cbg's says this is all she needs for the day.   Interval history: she does not check cbg's.  pt states she feels well in general.   Past Medical History:  Diagnosis Date  . Complication of anesthesia    states was hard to wake up after 1st EGD  . Depression   . GERD (gastroesophageal reflux disease)   . High cholesterol   . History of seizure    x 1 - due to hyperglycemia  . Hypertension    states under control with med., has been on med. > 20 yr.  . Insulin dependent diabetes mellitus ([place])   . Macular degeneration   . Nocturia   . Osteoarthritis    shoulder  . Osteoporosis   . Popping of temporomandibular joint on opening of jaw   . Sebaceous cyst 09/2014   chest wall - sternum  . Toenail fungus     Past Surgical History:  Procedure Laterality Date  . ABDOMINAL AORTOGRAM W/LOWER EXTREMITY Bilateral 10/30/2017   Procedure: ABDOMINAL AORTOGRAM W/LOWER EXTREMITY;  Surgeon: Elam Dutch, MD;  Location: [facility];  Service: Cardiovascular;  Laterality: Bilateral;  . ABDOMINAL HYSTERECTOMY     partial  . BLADDER SUSPENSION    . CATARACT EXTRACTION W/ INTRAOCULAR LENS  IMPLANT, BILATERAL Bilateral   . CESAREAN SECTION    . ESOPHAGOGASTRODUODENOSCOPY  05/07/2004  . ESOPHAGOGASTRODUODENOSCOPY (EGD) WITH PROPOFOL  07/06/2013  . LOWER EXTREMITY ANGIOGRAM Bilateral 04/12/2015   Procedure: Lower Extremity Angiogram;  Surgeon: Conrad Palmer, MD;  Location: [facility];  Service:  Cardiovascular;  Laterality: Bilateral;  . MASS EXCISION N/A 10/17/2014   Procedure: EXCISION 2X3CM SUBCUTANEOUS CHEST WALL CYST;  Surgeon: Rolm Bookbinder, MD;  Location: [facility];  Service: General;  Laterality: N/A;  . ORIF ANKLE FRACTURE Left 07/23/2012   Procedure: OPEN REDUCTION INTERNAL FIXATION (ORIF) ANKLE FRACTURE;  Surgeon: Jessy Oto, MD;  Location: [facility];  Service: Orthopedics;  Laterality: Left;  . ORIF WRIST FRACTURE Left   . PERIPHERAL VASCULAR BALLOON ANGIOPLASTY  10/30/2017   Procedure: PERIPHERAL VASCULAR BALLOON ANGIOPLASTY;  Surgeon: Elam Dutch, MD;  Location: [facility];  Service: Cardiovascular;;  rt sfa  . PERIPHERAL VASCULAR CATHETERIZATION N/A 04/12/2015   Procedure: Abdominal Aortogram;  Surgeon: Conrad [place], MD;  Location: [facility];  Service: Cardiovascular;  Laterality: N/A;  . PERIPHERAL VASCULAR CATHETERIZATION Right 04/12/2015   Procedure: Peripheral Vascular Balloon Angioplasty;  Surgeon: Conrad [place], MD;  Location: [facility];  Service: Cardiovascular;  Laterality: Right;  rt sfa drug coated balloon    Social History   Socioeconomic History  . Marital status: Widowed    Spouse name: Not on file  . Number of children: 3  . Years of education: Not on file  . Highest education level: Not on file  Occupational History  . 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 Financial resource strain: Not on file  . Food insecurity:    Worry: Not on file    Inability: Not on file  . Transportation needs:    Medical: Not on file    Non-medical: Not on file  Tobacco Use  . Smoking status: Former Smoker    Packs/day: 0.00    Last attempt to quit: 11/11/2011    Years since quitting: 6.0  . Smokeless tobacco: Never Used  Substance and Sexual Activity  . Alcohol use: No  . Drug use: No  . Sexual activity: Not on file  Lifestyle  . Physical  activity:    Days per week: Not on file    Minutes per session: Not on file  . Stress: Not on file  Relationships  . Social connections:    Talks on phone: Not on file    Gets together: Not on file    Attends religious service: Not on file    Active member of club or organization: Not on file    Attends meetings of clubs or organizations: Not on file    Relationship status: Not on file  . Intimate partner violence:    Fear of current or ex partner: Not on file    Emotionally abused: Not on file    Physically abused: Not on file    Forced sexual activity: Not on file  Other Topics Concern  . Not on file  Social History Narrative   Widowed   Daily Caffeine Use-2 cups daily or more   Pt does not get regular exercise   Works [employer], 3rd shift    Current Outpatient Medications on File Prior to Visit  Medication Sig Dispense Refill  . aspirin EC 81 MG tablet Take 1 tablet (81 mg total) by mouth daily. 150 tablet 2  . Blood Glucose Monitoring Suppl (TRUE METRIX AIR GLUCOSE METER) W/DEVICE KIT 1 Device by Other route once. 1 kit 0  . CALCIUM CITRATE-VITAMIN D PO Take 2 tablets by mouth daily.     . cholecalciferol (VITAMIN D) 1000 units tablet Take 2,000 Units by mouth daily.     . clopidogrel (PLAVIX) 75 MG tablet Take 1 tablet (75 mg total) by mouth daily. 30 tablet 11  . insulin aspart (NOVOLOG) 100 UNIT/ML injection Inject 80 Units into the skin daily with breakfast. 3 vial 6  . INSULIN SYRINGE 1CC/29G (MONOJECT ULTRA COMFORT SYRINGE) 29G X 1/2" 1 ML MISC USED TO INJECT INSULIN SUBCUTANEOUSLY TWICE DAILY. 200 each 2  . lisinopril (PRINIVIL,ZESTRIL) 10 MG tablet TAKE 1 TABLET EVERY DAY (Patient taking differently: Take 10 mg by mouth daily. ) 90 tablet 3  . omeprazole (PRILOSEC) 40 MG capsule Take 1 capsule (40 mg total) by mouth 2 (two) times daily. 180 capsule 3  . ranitidine (ZANTAC) 300 MG tablet Take 1 tablet (300 mg total) by mouth at bedtime. 90 tablet 3  . rosuvastatin  (CRESTOR) 10 MG tablet TAKE 1 TABLET EVERY DAY (Patient taking differently: Take 10 mg by mouth daily. ) 90 tablet 3  . triamcinolone cream (KENALOG) 0.1 % Apply 1 application topically 4 (four) times daily. As needed for itching 80 g 0  . TRUE METRIX BLOOD GLUCOSE TEST test strip TEST TWO TIMES DAILY 200 each 3  . TRUEPLUS LANCETS 28G MISC TEST TWO TIMES DAILY 200 each 3  . venlafaxine XR (EFFEXOR-XR) 150 MG 24 hr capsule TAKE 1 CAPSULE (150 MG TOTAL) BY MOUTH DAILY  WITH BREAKFAST. 90 capsule 11  . vitamin E (VITAMIN E) 400 UNIT capsule Take 400 Units by mouth daily.     No current facility-administered medications on file prior to visit.     Allergies  Allergen Reactions  . Procaine Other (See Comments)    Heart race  . Adhesive [Tape] Rash and Other (See Comments)    SKIN IRRITATION    Family History  Problem Relation Age of Onset  . Ovarian cancer Mother   . Stroke Father   . Diabetes Sister   . Stroke Sister   . Heart disease Sister        before age 50  . Diabetes Brother   . Heart disease Brother        before age 44  . Diabetes Sister     BP 132/70 (BP Location: Left Arm, Patient Position: Sitting, Cuff Size: Large)   Pulse 92   Ht _0  (1.549 m)   Wt 170 lb (77.1 kg)   SpO2 97%   BMI 32.12 kg/m    Review of Systems Toe ulcer is improving.      Objective:   Physical Exam VITAL SIGNS:  See vs page GENERAL: no distress Pulses: dorsalis pedis intact bilat.   MSK: no deformity of the feet CV: no leg edema Skin:  normal color and temp on the feet. Neuro: sensation is intact to touch on the feet Ext: Right 4th toe:  On the dorsal aspect, there is a 5-8 mm shallow ulcer. No erythema   Lab Results  Component Value Date   HGBA1C 10.3 (A) 11/10/2017   Lab Results  Component Value Date   CREATININE 1.07 11/10/2017   BUN 25 (H) 11/10/2017   NA 136 11/10/2017   K 3.9 11/10/2017   CL 97 11/10/2017   CO2 26 11/10/2017      Assessment & Plan:    Insulin-requiring type 2 DM, with DR: uncertain glycemic control Toe ulcer: persistent.  We discussed.  She declines to ret to wound care Renal insuff: as this is mild, it would be unusual for pt to need QD fast-acting insulin with breakfast only Noncompliance with cbg recording: we discussed the need to check  Patient Instructions  Keep the toe ulcer covered with antibiotic ointment and a bandaid.  check your blood sugar twice a day.  vary the time of day when you check, between before the 3 meals, and at bedtime.  also check if you have symptoms of your blood sugar being too high or too low.  please keep a record of the readings and bring it to your next appointment here (or you can bring the meter itself).  You can write it on any piece of paper.  please call us sooner if your blood sugar goes below 70, or if you have a lot of readings over 200.   It is unusual for someone to need breakfast insulin only, so I hesitate to increase it until we have more information about your blood sugar.   Please come back for a follow-up appointment in 2 weeks.

## 2017-12-17 DIAGNOSIS — H4312 Vitreous hemorrhage, left eye: Secondary | ICD-10-CM | POA: Diagnosis not present

## 2017-12-17 DIAGNOSIS — H43812 Vitreous degeneration, left eye: Secondary | ICD-10-CM | POA: Diagnosis not present

## 2017-12-17 DIAGNOSIS — E113513 Type 2 diabetes mellitus with proliferative diabetic retinopathy with macular edema, bilateral: Secondary | ICD-10-CM | POA: Diagnosis not present

## 2017-12-17 DIAGNOSIS — H35371 Puckering of macula, right eye: Secondary | ICD-10-CM | POA: Diagnosis not present

## 2017-12-18 ENCOUNTER — Ambulatory Visit: Payer: Medicare HMO | Admitting: Endocrinology

## 2017-12-22 ENCOUNTER — Ambulatory Visit (INDEPENDENT_AMBULATORY_CARE_PROVIDER_SITE_OTHER): Payer: Medicare HMO | Admitting: Endocrinology

## 2017-12-22 ENCOUNTER — Encounter: Payer: Self-pay | Admitting: Endocrinology

## 2017-12-22 VITALS — BP 130/60 | HR 92 | Ht 61.0 in | Wt 172.4 lb

## 2017-12-22 DIAGNOSIS — Z23 Encounter for immunization: Secondary | ICD-10-CM | POA: Diagnosis not present

## 2017-12-22 DIAGNOSIS — E119 Type 2 diabetes mellitus without complications: Secondary | ICD-10-CM

## 2017-12-22 MED ORDER — INSULIN NPH (HUMAN) (ISOPHANE) 100 UNIT/ML ~~LOC~~ SUSP: 5.0000 [IU] | Freq: Every day | SUBCUTANEOUS | 11 refills | Status: DC

## 2017-12-22 MED ORDER — INSULIN ASPART 100 UNIT/ML ~~LOC~~ SOLN: 70.0000 [IU] | Freq: Every day | SUBCUTANEOUS | 6 refills | Status: DC

## 2017-12-22 NOTE — Patient Instructions (Addendum)
check your blood sugar twice a day.  vary the time of day when you check, between before the 3 meals, and at bedtime.  also check if you have symptoms of your blood sugar being too high or too low.  please keep a record of the readings and bring it to your next appointment here (or you can bring the meter itself).  You can write it on any piece of paper.  please call us sooner if your blood sugar goes below 70, or if you have a lot of readings over 200.   Please reduce the novolog to 70 units with breakfast, and add NPH, 5 units at bedtime.  Please come back for a follow-up appointment in 6 weeks.

## 2017-12-22 NOTE — Progress Notes (Signed)
Subjective:    Patient ID: Yvonne Reynolds, female    DOB: Mar 24, 1945, 72 y.o.   MRN: [id number]  HPI Pt returns for f/u of diabetes mellitus: DM type: Insulin-requiring type 2 Dx'ed: 0347 Complications: retinopathy and nephropathy.   Therapy: insulin since 1999 GDM: never.  DKA: never.   Severe hypoglycemia: once, in mid-2017.   Pancreatitis: never.  Other: she declines multiple daily injections.  She takes she takes human insulin, due to cost.  She takes Reg QAM; the pattern of her cbg's says this is all she needs for the day.   Interval history: she brings a record of her cbg's which I have reviewed today.  cbg varies from 98-365.  It is in general lowest in the afternoon and at HS, and lowest fasting.  pt states she feels well in general.  Past Medical History:  Diagnosis Date  . Complication of anesthesia    states was hard to wake up after 1st EGD  . Depression   . GERD (gastroesophageal reflux disease)   . High cholesterol   . History of seizure    x 1 - due to hyperglycemia  . Hypertension    states under control with med., has been on med. > 20 yr.  . Insulin dependent diabetes mellitus ([place])   . Macular degeneration   . Nocturia   . Osteoarthritis    shoulder  . Osteoporosis   . Popping of temporomandibular joint on opening of jaw   . Sebaceous cyst 09/2014   chest wall - sternum  . Toenail fungus     Past Surgical History:  Procedure Laterality Date  . ABDOMINAL AORTOGRAM W/LOWER EXTREMITY Bilateral 10/30/2017   Procedure: ABDOMINAL AORTOGRAM W/LOWER EXTREMITY;  Surgeon: Elam Dutch, MD;  Location: [facility];  Service: Cardiovascular;  Laterality: Bilateral;  . ABDOMINAL HYSTERECTOMY     partial  . BLADDER SUSPENSION    . CATARACT EXTRACTION W/ INTRAOCULAR LENS  IMPLANT, BILATERAL Bilateral   . CESAREAN SECTION    . ESOPHAGOGASTRODUODENOSCOPY  05/07/2004  . ESOPHAGOGASTRODUODENOSCOPY (EGD) WITH PROPOFOL  07/06/2013  . LOWER EXTREMITY  ANGIOGRAM Bilateral 04/12/2015   Procedure: Lower Extremity Angiogram;  Surgeon: Conrad [place], MD;  Location: [facility];  Service: Cardiovascular;  Laterality: Bilateral;  . MASS EXCISION N/A 10/17/2014   Procedure: EXCISION 2X3CM SUBCUTANEOUS CHEST WALL CYST;  Surgeon: Rolm Bookbinder, MD;  Location: [facility];  Service: General;  Laterality: N/A;  . ORIF ANKLE FRACTURE Left 07/23/2012   Procedure: OPEN REDUCTION INTERNAL FIXATION (ORIF) ANKLE FRACTURE;  Surgeon: Jessy Oto, MD;  Location: [facility];  Service: Orthopedics;  Laterality: Left;  . ORIF WRIST FRACTURE Left   . PERIPHERAL VASCULAR BALLOON ANGIOPLASTY  10/30/2017   Procedure: PERIPHERAL VASCULAR BALLOON ANGIOPLASTY;  Surgeon: Elam Dutch, MD;  Location: [facility];  Service: Cardiovascular;;  rt sfa  . PERIPHERAL VASCULAR CATHETERIZATION N/A 04/12/2015   Procedure: Abdominal Aortogram;  Surgeon: Conrad Calvin, MD;  Location: [facility];  Service: Cardiovascular;  Laterality: N/A;  . PERIPHERAL VASCULAR CATHETERIZATION Right 04/12/2015   Procedure: Peripheral Vascular Balloon Angioplasty;  Surgeon: Conrad , MD;  Location: [facility];  Service: Cardiovascular;  Laterality: Right;  rt sfa drug coated balloon    Social History   Socioeconomic History  . Marital status: Widowed    Spouse name: Not on file  . Number of children: 3  . Years of education: Not on  file  . Highest education level: Not on file  Occupational History  . 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 Financial resource strain: Not on file  . Food insecurity:    Worry: Not on file    Inability: Not on file  . Transportation needs:    Medical: Not on file    Non-medical: Not on file  Tobacco Use  . Smoking status: Former Smoker    Packs/day: 0.00    Last attempt to quit: 11/11/2011    Years since quitting: 6.1  . Smokeless tobacco:  Never Used  Substance and Sexual Activity  . Alcohol use: No  . Drug use: No  . Sexual activity: Not on file  Lifestyle  . Physical activity:    Days per week: Not on file    Minutes per session: Not on file  . Stress: Not on file  Relationships  . Social connections:    Talks on phone: Not on file    Gets together: Not on file    Attends religious service: Not on file    Active member of club or organization: Not on file    Attends meetings of clubs or organizations: Not on file    Relationship status: Not on file  . Intimate partner violence:    Fear of current or ex partner: Not on file    Emotionally abused: Not on file    Physically abused: Not on file    Forced sexual activity: Not on file  Other Topics Concern  . Not on file  Social History Narrative   Widowed   Daily Caffeine Use-2 cups daily or more   Pt does not get regular exercise   Works [employer], 3rd shift    Current Outpatient Medications on File Prior to Visit  Medication Sig Dispense Refill  . aspirin EC 81 MG tablet Take 1 tablet (81 mg total) by mouth daily. 150 tablet 2  . Blood Glucose Monitoring Suppl (TRUE METRIX AIR GLUCOSE METER) W/DEVICE KIT 1 Device by Other route once. 1 kit 0  . CALCIUM CITRATE-VITAMIN D PO Take 2 tablets by mouth daily.     . cholecalciferol (VITAMIN D) 1000 units tablet Take 2,000 Units by mouth daily.     . INSULIN SYRINGE 1CC/29G (MONOJECT ULTRA COMFORT SYRINGE) 29G X 1/2" 1 ML MISC USED TO INJECT INSULIN SUBCUTANEOUSLY TWICE DAILY. 200 each 2  . lisinopril (PRINIVIL,ZESTRIL) 10 MG tablet TAKE 1 TABLET EVERY DAY (Patient taking differently: Take 10 mg by mouth daily. ) 90 tablet 3  . omeprazole (PRILOSEC) 40 MG capsule Take 1 capsule (40 mg total) by mouth 2 (two) times daily. 180 capsule 3  . ranitidine (ZANTAC) 300 MG tablet Take 1 tablet (300 mg total) by mouth at bedtime. 90 tablet 3  . rosuvastatin (CRESTOR) 10 MG tablet TAKE 1 TABLET EVERY DAY (Patient taking  differently: Take 10 mg by mouth daily. ) 90 tablet 3  . TRUE METRIX BLOOD GLUCOSE TEST test strip TEST TWO TIMES DAILY 200 each 3  . TRUEPLUS LANCETS 28G MISC TEST TWO TIMES DAILY 200 each 3  . venlafaxine XR (EFFEXOR-XR) 150 MG 24 hr capsule TAKE 1 CAPSULE (150 MG TOTAL) BY MOUTH DAILY WITH BREAKFAST. 90 capsule 11  . vitamin E (VITAMIN E) 400 UNIT capsule Take 400 Units by mouth daily.    . clopidogrel (PLAVIX) 75 MG tablet Take 1 tablet (75 mg  total) by mouth daily. (Patient not taking: Reported on 12/22/2017) 30 tablet 11   No current facility-administered medications on file prior to visit.     Allergies  Allergen Reactions  . Procaine Other (See Comments)    Heart race  . Adhesive [Tape] Rash and Other (See Comments)    SKIN IRRITATION    Family History  Problem Relation Age of Onset  . Ovarian cancer Mother   . Stroke Father   . Diabetes Sister   . Stroke Sister   . Heart disease Sister        before age 61  . Diabetes Brother   . Heart disease Brother        before age 65  . Diabetes Sister     BP 130/60 (BP Location: Left Arm, Patient Position: Sitting, Cuff Size: Normal)   Pulse 92   Ht 5' 1" (1.549 m)   Wt 172 lb 6.4 oz (78.2 kg)   SpO2 94%   BMI 32.57 kg/m    Review of Systems She denies hypoglycemia.      Objective:   Physical Exam VITAL SIGNS:  See vs page GENERAL: no distress Pulses: dorsalis pedis intact bilat.   MSK: no deformity of the feet CV: no leg edema Skin:  normal color and temp on the feet. Neuro: sensation is intact to touch on the feet Ext: Right 4th toe: ulcer is almost completely healed. No erythema.     Lab Results  Component Value Date   HGBA1C 10.3 (A) 11/10/2017      Assessment & Plan:  Insulin-requiring type 2 DM, with DR: Based on the pattern of her cbg's, she needs some adjustment in her therapy.   Patient Instructions  check your blood sugar twice a day.  vary the time of day when you check, between before the 3  meals, and at bedtime.  also check if you have symptoms of your blood sugar being too high or too low.  please keep a record of the readings and bring it to your next appointment here (or you can bring the meter itself).  You can write it on any piece of paper.  please call us sooner if your blood sugar goes below 70, or if you have a lot of readings over 200.   Please reduce the novolog to 70 units with breakfast, and add NPH, 5 units at bedtime.  Please come back for a follow-up appointment in 6 weeks.

## 2018-01-05 DIAGNOSIS — E113511 Type 2 diabetes mellitus with proliferative diabetic retinopathy with macular edema, right eye: Secondary | ICD-10-CM | POA: Diagnosis not present

## 2018-01-05 DIAGNOSIS — E113513 Type 2 diabetes mellitus with proliferative diabetic retinopathy with macular edema, bilateral: Secondary | ICD-10-CM | POA: Diagnosis not present

## 2018-01-11 DIAGNOSIS — E113512 Type 2 diabetes mellitus with proliferative diabetic retinopathy with macular edema, left eye: Secondary | ICD-10-CM | POA: Diagnosis not present

## 2018-02-04 ENCOUNTER — Ambulatory Visit: Payer: Medicare HMO | Admitting: Endocrinology

## 2018-02-11 DIAGNOSIS — E113513 Type 2 diabetes mellitus with proliferative diabetic retinopathy with macular edema, bilateral: Secondary | ICD-10-CM | POA: Diagnosis not present

## 2018-02-11 DIAGNOSIS — H4312 Vitreous hemorrhage, left eye: Secondary | ICD-10-CM | POA: Diagnosis not present

## 2018-02-11 DIAGNOSIS — H35371 Puckering of macula, right eye: Secondary | ICD-10-CM | POA: Diagnosis not present

## 2018-02-15 ENCOUNTER — Encounter: Payer: Self-pay | Admitting: Endocrinology

## 2018-02-15 ENCOUNTER — Ambulatory Visit: Payer: Medicare HMO | Admitting: Endocrinology

## 2018-02-15 VITALS — BP 150/78 | HR 91 | Ht 61.0 in | Wt 173.2 lb

## 2018-02-15 DIAGNOSIS — E1151 Type 2 diabetes mellitus with diabetic peripheral angiopathy without gangrene: Secondary | ICD-10-CM

## 2018-02-15 DIAGNOSIS — Z794 Long term (current) use of insulin: Secondary | ICD-10-CM

## 2018-02-15 LAB — POCT GLYCOSYLATED HEMOGLOBIN (HGB A1C): Hemoglobin A1C: 10.9 % — AB (ref 4.0–5.6)

## 2018-02-15 NOTE — Progress Notes (Signed)
Subjective:    Patient ID: Yvonne Reynolds, female    DOB: [birth year], 73 y.o.   MRN: [id number]  HPI Pt returns for f/u of diabetes mellitus: DM type: Insulin-requiring type 2 Dx'ed: 4287 Complications: retinopathy and nephropathy.   Therapy: insulin since 1999.  GDM: never.  DKA: never.   Severe hypoglycemia: once, in mid-2017.   Pancreatitis: never.  Other: she declines multiple daily injections.  She takes she takes human insulin, due to cost.  She takes Reg QAM; the pattern of her cbg's says this is all she needs for the day.   Interval history: she seldom checks cbg.  She says it varies from 45-300.  It is in general lowest at HS.  pt states she feels well in general.   Past Medical History:  Diagnosis Date  . Complication of anesthesia    states was hard to wake up after 1st EGD  . Depression   . GERD (gastroesophageal reflux disease)   . High cholesterol   . History of seizure    x 1 - due to hyperglycemia  . Hypertension    states under control with med., has been on med. > 20 yr.  . Insulin dependent diabetes mellitus ([place])   . Macular degeneration   . Nocturia   . Osteoarthritis    shoulder  . Osteoporosis   . Popping of temporomandibular joint on opening of jaw   . Sebaceous cyst 09/2014   chest wall - sternum  . Toenail fungus     Past Surgical History:  Procedure Laterality Date  . ABDOMINAL AORTOGRAM W/LOWER EXTREMITY Bilateral 10/30/2017   Procedure: ABDOMINAL AORTOGRAM W/LOWER EXTREMITY;  Surgeon: Elam Dutch, MD;  Location: [facility];  Service: Cardiovascular;  Laterality: Bilateral;  . ABDOMINAL HYSTERECTOMY     partial  . BLADDER SUSPENSION    . CATARACT EXTRACTION W/ INTRAOCULAR LENS  IMPLANT, BILATERAL Bilateral   . CESAREAN SECTION    . ESOPHAGOGASTRODUODENOSCOPY  05/07/2004  . ESOPHAGOGASTRODUODENOSCOPY (EGD) WITH PROPOFOL  07/06/2013  . LOWER EXTREMITY ANGIOGRAM Bilateral 04/12/2015   Procedure: Lower Extremity Angiogram;   Surgeon: Conrad [place], MD;  Location: [facility];  Service: Cardiovascular;  Laterality: Bilateral;  . MASS EXCISION N/A 10/17/2014   Procedure: EXCISION 2X3CM SUBCUTANEOUS CHEST WALL CYST;  Surgeon: Rolm Bookbinder, MD;  Location: [facility];  Service: General;  Laterality: N/A;  . ORIF ANKLE FRACTURE Left 07/23/2012   Procedure: OPEN REDUCTION INTERNAL FIXATION (ORIF) ANKLE FRACTURE;  Surgeon: Jessy Oto, MD;  Location: [facility];  Service: Orthopedics;  Laterality: Left;  . ORIF WRIST FRACTURE Left   . PERIPHERAL VASCULAR BALLOON ANGIOPLASTY  10/30/2017   Procedure: PERIPHERAL VASCULAR BALLOON ANGIOPLASTY;  Surgeon: Elam Dutch, MD;  Location: [facility];  Service: Cardiovascular;;  rt sfa  . PERIPHERAL VASCULAR CATHETERIZATION N/A 04/12/2015   Procedure: Abdominal Aortogram;  Surgeon: Conrad [place], MD;  Location: [facility];  Service: Cardiovascular;  Laterality: N/A;  . PERIPHERAL VASCULAR CATHETERIZATION Right 04/12/2015   Procedure: Peripheral Vascular Balloon Angioplasty;  Surgeon: Conrad , MD;  Location: [facility];  Service: Cardiovascular;  Laterality: Right;  rt sfa drug coated balloon    Social History   Socioeconomic History  . Marital status: Widowed    Spouse name: Not on file  . Number of children: 3  . Years of education: Not on file  . Highest education level: Not on file  Occupational  History  . 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 Financial resource strain: Not on file  . Food insecurity:    Worry: Not on file    Inability: Not on file  . Transportation needs:    Medical: Not on file    Non-medical: Not on file  Tobacco Use  . Smoking status: Former Smoker    Packs/day: 0.00    Last attempt to quit: 11/11/2011    Years since quitting: 6.2  . Smokeless tobacco: Never Used  Substance and Sexual Activity  . Alcohol use: No  . Drug  use: No  . Sexual activity: Not on file  Lifestyle  . Physical activity:    Days per week: Not on file    Minutes per session: Not on file  . Stress: Not on file  Relationships  . Social connections:    Talks on phone: Not on file    Gets together: Not on file    Attends religious service: Not on file    Active member of club or organization: Not on file    Attends meetings of clubs or organizations: Not on file    Relationship status: Not on file  . Intimate partner violence:    Fear of current or ex partner: Not on file    Emotionally abused: Not on file    Physically abused: Not on file    Forced sexual activity: Not on file  Other Topics Concern  . Not on file  Social History Narrative   Widowed   Daily Caffeine Use-2 cups daily or more   Pt does not get regular exercise   Works [employer], 3rd shift    Current Outpatient Medications on File Prior to Visit  Medication Sig Dispense Refill  . Blood Glucose Monitoring Suppl (TRUE METRIX AIR GLUCOSE METER) W/DEVICE KIT 1 Device by Other route once. 1 kit 0  . CALCIUM CITRATE-VITAMIN D PO Take 2 tablets by mouth daily.     . cholecalciferol (VITAMIN D) 1000 units tablet Take 2,000 Units by mouth daily.     . insulin NPH Human (NOVOLIN N) 100 UNIT/ML injection Inject 0.05 mLs (5 Units total) into the skin at bedtime. 10 mL 11  . insulin regular (NOVOLIN R,HUMULIN R) 100 units/mL injection Inject 70 Units into the skin daily.    . INSULIN SYRINGE 1CC/29G (MONOJECT ULTRA COMFORT SYRINGE) 29G X 1/2" 1 ML MISC USED TO INJECT INSULIN SUBCUTANEOUSLY TWICE DAILY. 200 each 2  . lisinopril (PRINIVIL,ZESTRIL) 10 MG tablet TAKE 1 TABLET EVERY DAY (Patient taking differently: Take 10 mg by mouth daily. ) 90 tablet 3  . omeprazole (PRILOSEC) 40 MG capsule Take 1 capsule (40 mg total) by mouth 2 (two) times daily. 180 capsule 3  . ranitidine (ZANTAC) 300 MG tablet Take 1 tablet (300 mg total) by mouth at bedtime. 90 tablet 3  . rosuvastatin  (CRESTOR) 10 MG tablet TAKE 1 TABLET EVERY DAY (Patient taking differently: Take 10 mg by mouth daily. ) 90 tablet 3  . TRUE METRIX BLOOD GLUCOSE TEST test strip TEST TWO TIMES DAILY 200 each 3  . TRUEPLUS LANCETS 28G MISC TEST TWO TIMES DAILY 200 each 3  . venlafaxine XR (EFFEXOR-XR) 150 MG 24 hr capsule TAKE 1 CAPSULE (150 MG TOTAL) BY MOUTH DAILY WITH BREAKFAST. 90 capsule 11  . vitamin E (VITAMIN E) 400 UNIT capsule Take 400 Units by mouth daily.  No current facility-administered medications on file prior to visit.     Allergies  Allergen Reactions  . Procaine Other (See Comments)    Heart race  . Adhesive [Tape] Rash and Other (See Comments)    SKIN IRRITATION    Family History  Problem Relation Age of Onset  . Ovarian cancer Mother   . Stroke Father   . Diabetes Sister   . Stroke Sister   . Heart disease Sister        before age 37  . Diabetes Brother   . Heart disease Brother        before age 25  . Diabetes Sister     BP (!) 150/78 (BP Location: Left Arm, Patient Position: Sitting, Cuff Size: Large)   Pulse 91   Ht 5' 1"  (1.549 m)   Wt 173 lb 3.2 oz (78.6 kg)   SpO2 95%   BMI 32.73 kg/m    Review of Systems Denies LOC    Objective:   Physical Exam VITAL SIGNS:  See vs page GENERAL: no distress Pulses: dorsalis pedis intact bilat.   MSK: no deformity of the feet CV: no leg edema Skin:  no ulcer on the feet, but the skin is dry.  normal color and temp on the feet. Neuro: sensation is intact to touch on the feet.     Lab Results  Component Value Date   HGBA1C 10.9 (A) 02/15/2018    Lab Results  Component Value Date   CREATININE 1.07 11/10/2017   BUN 25 (H) 11/10/2017   NA 136 11/10/2017   K 3.9 11/10/2017   CL 97 11/10/2017   CO2 26 11/10/2017        Assessment & Plan:  Insulin-requiring type 2 DM, with DR: we discussed the fact that she has long duration of action of reg insulin.  I told pt I need cbg data in order to properly adjust  insulin Hypoglycemia: this is limiting aggressiveness of glycemic control HTN: is noted today  Patient Instructions  Your blood pressure is high today.  Please see your primary care provider soon, to have it rechecked.   check your blood sugar twice a day.  vary the time of day when you check, between before the 3 meals, and at bedtime.  also check if you have symptoms of your blood sugar being too high or too low.  please keep a record of the readings and bring it to your next appointment here (or you can bring the meter itself).  You can write it on any piece of paper.  please call us sooner if your blood sugar goes below 70, or if you have a lot of readings over 200.  Please continue the same insulins for now.   Please come back for a follow-up appointment in 2 weeks.

## 2018-02-15 NOTE — Patient Instructions (Addendum)
Your blood pressure is high today.  Please see your primary care provider soon, to have it rechecked.   check your blood sugar twice a day.  vary the time of day when you check, between before the 3 meals, and at bedtime.  also check if you have symptoms of your blood sugar being too high or too low.  please keep a record of the readings and bring it to your next appointment here (or you can bring the meter itself).  You can write it on any piece of paper.  please call us sooner if your blood sugar goes below 70, or if you have a lot of readings over 200.  Please continue the same insulins for now.   Please come back for a follow-up appointment in 2 weeks.

## 2018-03-03 ENCOUNTER — Ambulatory Visit: Payer: Medicare HMO | Admitting: Endocrinology

## 2018-03-03 ENCOUNTER — Encounter: Payer: Self-pay | Admitting: Endocrinology

## 2018-03-03 VITALS — BP 160/80 | HR 100 | Ht 61.0 in | Wt 171.2 lb

## 2018-03-03 DIAGNOSIS — E1151 Type 2 diabetes mellitus with diabetic peripheral angiopathy without gangrene: Secondary | ICD-10-CM | POA: Diagnosis not present

## 2018-03-03 DIAGNOSIS — Z794 Long term (current) use of insulin: Secondary | ICD-10-CM | POA: Diagnosis not present

## 2018-03-03 MED ORDER — INSULIN NPH (HUMAN) (ISOPHANE) 100 UNIT/ML ~~LOC~~ SUSP: 10.0000 [IU] | Freq: Every day | SUBCUTANEOUS | 11 refills | Status: AC

## 2018-03-03 MED ORDER — INSULIN REGULAR HUMAN 100 UNIT/ML IJ SOLN: 65.0000 [IU] | Freq: Every day | INTRAMUSCULAR | 11 refills | Status: AC

## 2018-03-03 NOTE — Patient Instructions (Addendum)
Your blood pressure is high today.  Please see your primary care provider soon, to have it rechecked.   check your blood sugar twice a day.  vary the time of day when you check, between before the 3 meals, and at bedtime.  also check if you have symptoms of your blood sugar being too high or too low.  please keep a record of the readings and bring it to your next appointment here (or you can bring the meter itself).  You can write it on any piece of paper.  please call us sooner if your blood sugar goes below 70, or if you have a lot of readings over 200.  Please change the insulins to the numbers listed below Please come back for a follow-up appointment in 2 weeks.

## 2018-03-03 NOTE — Progress Notes (Signed)
Subjective:    Patient ID: Yvonne Reynolds, female    DOB: February 08, 1945, 73 y.o.   MRN: [id number]  HPI Pt returns for f/u of diabetes mellitus: DM type: Insulin-requiring type 2 Dx'ed: 6659 Complications: retinopathy and nephropathy.   Therapy: insulin since 1999.  GDM: never.  DKA: never.   Severe hypoglycemia: once, in mid-2017.   Pancreatitis: never.  Other: she declines multiple daily injections.  She takes she takes human insulin, due to cost.  She takes Reg QAM; the pattern of her cbg's says this is all she needs for the day.   Interval history: she brings a scant record of her cbg's which I have reviewed today.  All are fasting, except for 1 cbg in the afternoon.  it varies from 65-289.  It is lowest in the afternoon.  pt states she feels well in general.  She sometimes misses the insulin.  Past Medical History:  Diagnosis Date  . Complication of anesthesia    states was hard to wake up after 1st EGD  . Depression   . GERD (gastroesophageal reflux disease)   . High cholesterol   . History of seizure    x 1 - due to hyperglycemia  . Hypertension    states under control with med., has been on med. > 20 yr.  . Insulin dependent diabetes mellitus ([place])   . Macular degeneration   . Nocturia   . Osteoarthritis    shoulder  . Osteoporosis   . Popping of temporomandibular joint on opening of jaw   . Sebaceous cyst 09/2014   chest wall - sternum  . Toenail fungus     Past Surgical History:  Procedure Laterality Date  . ABDOMINAL AORTOGRAM W/LOWER EXTREMITY Bilateral 10/30/2017   Procedure: ABDOMINAL AORTOGRAM W/LOWER EXTREMITY;  Surgeon: Elam Dutch, MD;  Location: [facility];  Service: Cardiovascular;  Laterality: Bilateral;  . ABDOMINAL HYSTERECTOMY     partial  . BLADDER SUSPENSION    . CATARACT EXTRACTION W/ INTRAOCULAR LENS  IMPLANT, BILATERAL Bilateral   . CESAREAN SECTION    . ESOPHAGOGASTRODUODENOSCOPY  05/07/2004  . ESOPHAGOGASTRODUODENOSCOPY  (EGD) WITH PROPOFOL  07/06/2013  . LOWER EXTREMITY ANGIOGRAM Bilateral 04/12/2015   Procedure: Lower Extremity Angiogram;  Surgeon: Conrad [place], MD;  Location: [facility];  Service: Cardiovascular;  Laterality: Bilateral;  . MASS EXCISION N/A 10/17/2014   Procedure: EXCISION 2X3CM SUBCUTANEOUS CHEST WALL CYST;  Surgeon: Rolm Bookbinder, MD;  Location: [facility];  Service: General;  Laterality: N/A;  . ORIF ANKLE FRACTURE Left 07/23/2012   Procedure: OPEN REDUCTION INTERNAL FIXATION (ORIF) ANKLE FRACTURE;  Surgeon: Jessy Oto, MD;  Location: [facility];  Service: Orthopedics;  Laterality: Left;  . ORIF WRIST FRACTURE Left   . PERIPHERAL VASCULAR BALLOON ANGIOPLASTY  10/30/2017   Procedure: PERIPHERAL VASCULAR BALLOON ANGIOPLASTY;  Surgeon: Elam Dutch, MD;  Location: [facility];  Service: Cardiovascular;;  rt sfa  . PERIPHERAL VASCULAR CATHETERIZATION N/A 04/12/2015   Procedure: Abdominal Aortogram;  Surgeon: Conrad [place], MD;  Location: [facility];  Service: Cardiovascular;  Laterality: N/A;  . PERIPHERAL VASCULAR CATHETERIZATION Right 04/12/2015   Procedure: Peripheral Vascular Balloon Angioplasty;  Surgeon: Conrad [place], MD;  Location: [facility];  Service: Cardiovascular;  Laterality: Right;  rt sfa drug coated balloon    Social History   Socioeconomic History  . Marital status: Widowed    Spouse name: Not on file  .  Number of children: 3  . Years of education: Not on file  . Highest education level: Not on file  Occupational History  . 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 Financial resource strain: Not on file  . Food insecurity:    Worry: Not on file    Inability: Not on file  . Transportation needs:    Medical: Not on file    Non-medical: Not on file  Tobacco Use  . Smoking status: Former Smoker    Packs/day: 0.00    Last attempt to quit: 11/11/2011     Years since quitting: 6.3  . Smokeless tobacco: Never Used  Substance and Sexual Activity  . Alcohol use: No  . Drug use: No  . Sexual activity: Not on file  Lifestyle  . Physical activity:    Days per week: Not on file    Minutes per session: Not on file  . Stress: Not on file  Relationships  . Social connections:    Talks on phone: Not on file    Gets together: Not on file    Attends religious service: Not on file    Active member of club or organization: Not on file    Attends meetings of clubs or organizations: Not on file    Relationship status: Not on file  . Intimate partner violence:    Fear of current or ex partner: Not on file    Emotionally abused: Not on file    Physically abused: Not on file    Forced sexual activity: Not on file  Other Topics Concern  . Not on file  Social History Narrative   Widowed   Daily Caffeine Use-2 cups daily or more   Pt does not get regular exercise   Works [employer], 3rd shift    Current Outpatient Medications on File Prior to Visit  Medication Sig Dispense Refill  . Blood Glucose Monitoring Suppl (TRUE METRIX AIR GLUCOSE METER) W/DEVICE KIT 1 Device by Other route once. 1 kit 0  . CALCIUM CITRATE-VITAMIN D PO Take 2 tablets by mouth daily.     . cholecalciferol (VITAMIN D) 1000 units tablet Take 2,000 Units by mouth daily.     . INSULIN SYRINGE 1CC/29G (MONOJECT ULTRA COMFORT SYRINGE) 29G X 1/2" 1 ML MISC USED TO INJECT INSULIN SUBCUTANEOUSLY TWICE DAILY. 200 each 2  . lisinopril (PRINIVIL,ZESTRIL) 10 MG tablet TAKE 1 TABLET EVERY DAY (Patient taking differently: Take 10 mg by mouth daily. ) 90 tablet 3  . omeprazole (PRILOSEC) 40 MG capsule Take 1 capsule (40 mg total) by mouth 2 (two) times daily. 180 capsule 3  . ranitidine (ZANTAC) 300 MG tablet Take 1 tablet (300 mg total) by mouth at bedtime. 90 tablet 3  . rosuvastatin (CRESTOR) 10 MG tablet TAKE 1 TABLET EVERY DAY (Patient taking differently: Take 10 mg by mouth daily. ) 90  tablet 3  . TRUE METRIX BLOOD GLUCOSE TEST test strip TEST TWO TIMES DAILY 200 each 3  . TRUEPLUS LANCETS 28G MISC TEST TWO TIMES DAILY 200 each 3  . venlafaxine XR (EFFEXOR-XR) 150 MG 24 hr capsule TAKE 1 CAPSULE (150 MG TOTAL) BY MOUTH DAILY WITH BREAKFAST. 90 capsule 11  . vitamin E (VITAMIN E) 400 UNIT capsule Take 400 Units by mouth daily.     No current facility-administered medications on file prior to visit.     Allergies  Allergen Reactions  .  Procaine Other (See Comments)    Heart race  . Adhesive [Tape] Rash and Other (See Comments)    SKIN IRRITATION    Family History  Problem Relation Age of Onset  . Ovarian cancer Mother   . Stroke Father   . Diabetes Sister   . Stroke Sister   . Heart disease Sister        before age 49  . Diabetes Brother   . Heart disease Brother        before age 83  . Diabetes Sister     BP (!) 160/80 (BP Location: Right Arm, Patient Position: Sitting, Cuff Size: Large) Comment: takes b/p med at night  Pulse 100   Ht 5' 1"  (1.549 m)   Wt 171 lb 3.2 oz (77.7 kg)   SpO2 94%   BMI 32.35 kg/m    Review of Systems Denies LOC.      Objective:   Physical Exam VITAL SIGNS:  See vs page GENERAL: no distress Pulses: dorsalis pedis intact bilat.   MSK: no deformity of the feet CV: no leg edema Skin:  no ulcer on the feet.  normal color and temp on the feet. Neuro: sensation is intact to touch on the feet  Lab Results  Component Value Date   CREATININE 1.07 11/10/2017   BUN 25 (H) 11/10/2017   NA 136 11/10/2017   K 3.9 11/10/2017   CL 97 11/10/2017   CO2 26 11/10/2017    Lab Results  Component Value Date   HGBA1C 10.9 (A) 02/15/2018       Assessment & Plan:  Insulin-requiring type 2 DM, with DR: worse.  Hypoglycemia: in this setting, we need to increase insulin slowly HTN: is noted today.  Patient Instructions  Your blood pressure is high today.  Please see your primary care provider soon, to have it rechecked.     check your blood sugar twice a day.  vary the time of day when you check, between before the 3 meals, and at bedtime.  also check if you have symptoms of your blood sugar being too high or too low.  please keep a record of the readings and bring it to your next appointment here (or you can bring the meter itself).  You can write it on any piece of paper.  please call us sooner if your blood sugar goes below 70, or if you have a lot of readings over 200.  Please change the insulins to the numbers listed below Please come back for a follow-up appointment in 2 weeks.

## 2018-03-18 ENCOUNTER — Ambulatory Visit: Payer: Medicare HMO | Admitting: Endocrinology

## 2018-03-19 ENCOUNTER — Ambulatory Visit: Payer: Medicare HMO | Admitting: Nurse Practitioner

## 2018-03-28 IMAGING — CR DG HAND COMPLETE 3+V*R*
3 series · 3 of 3 positions shown · non-contrast
Comparison: None.

CLINICAL DATA: Hx of a fall 6 days ago, pain and bruising
metacarpals

EXAM:
RIGHT HAND - COMPLETE 3+ VIEW

[x hand pa right]
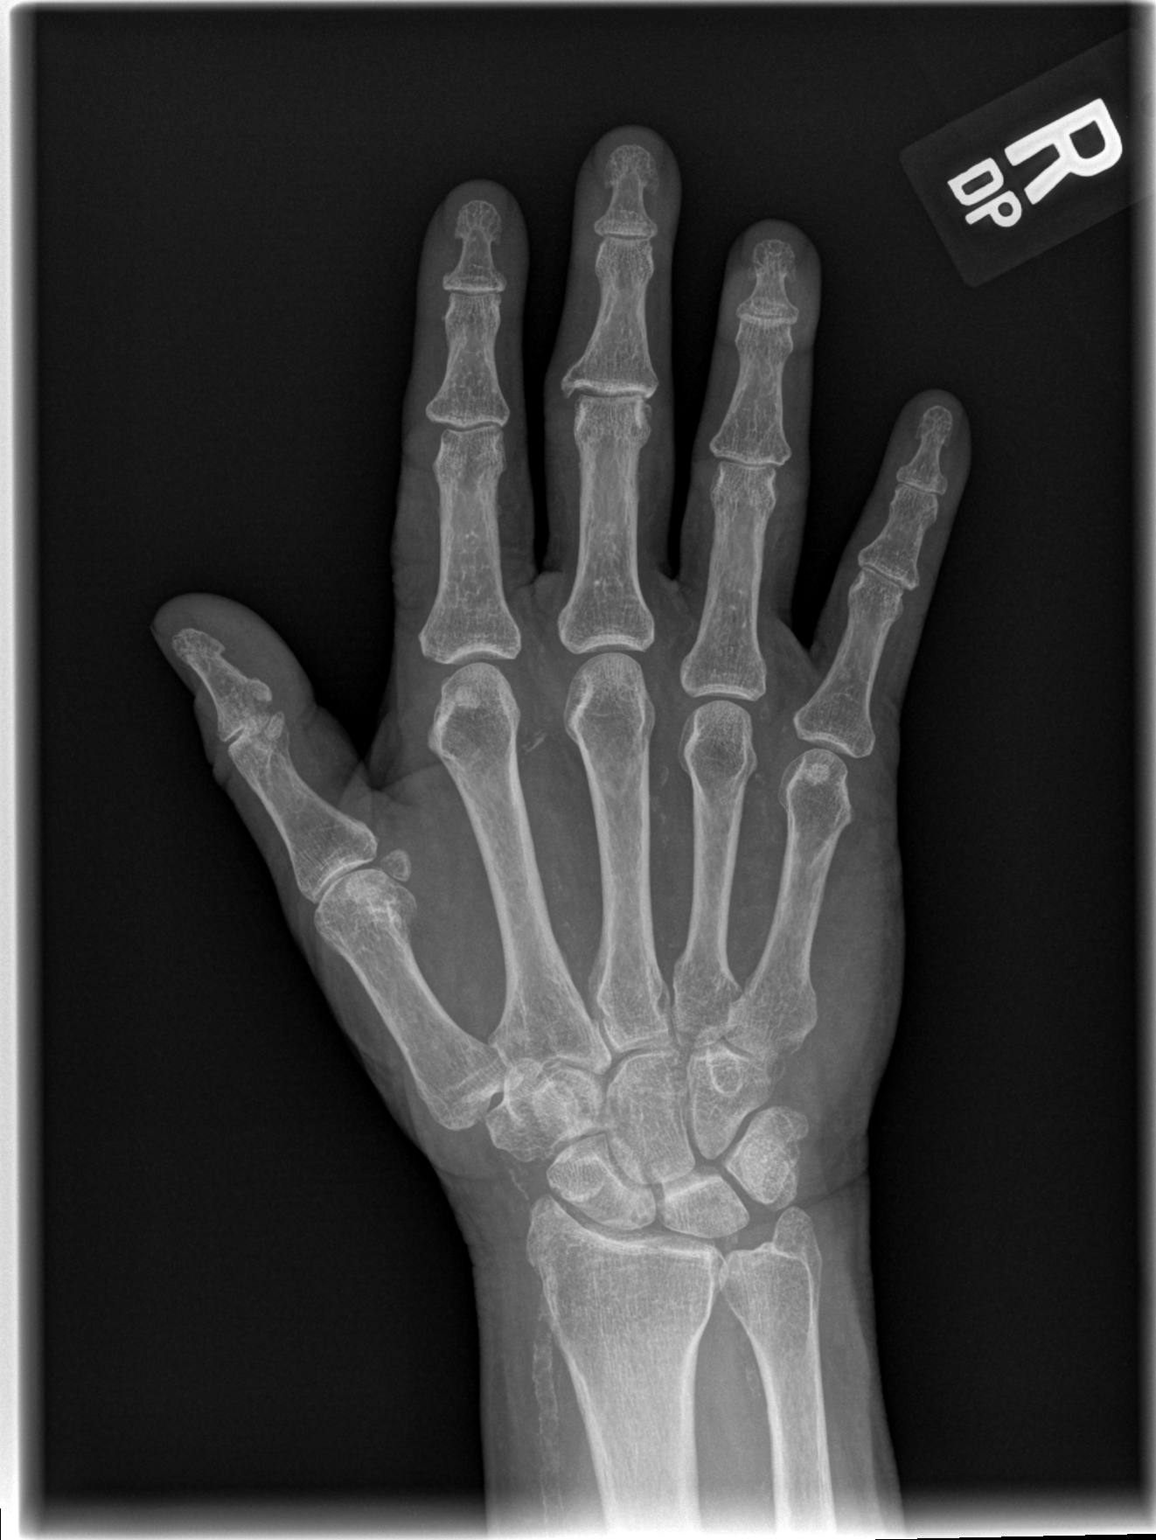

[x hand oblique right]
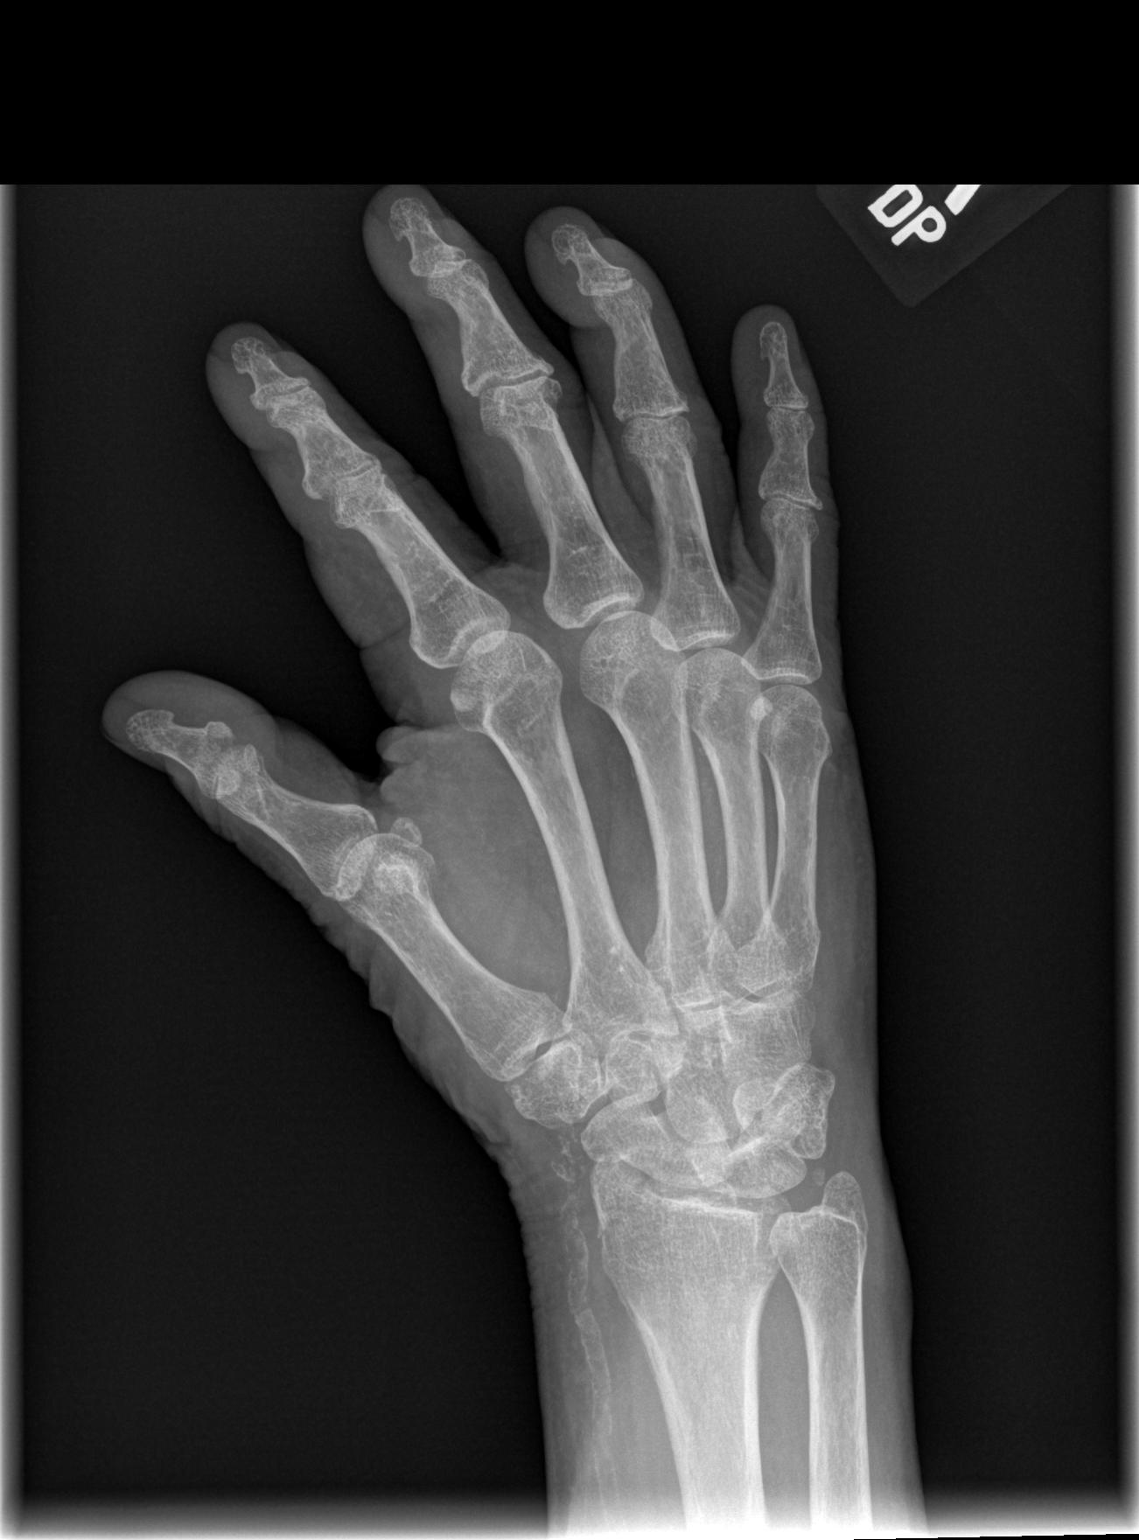

[x hand lat right]
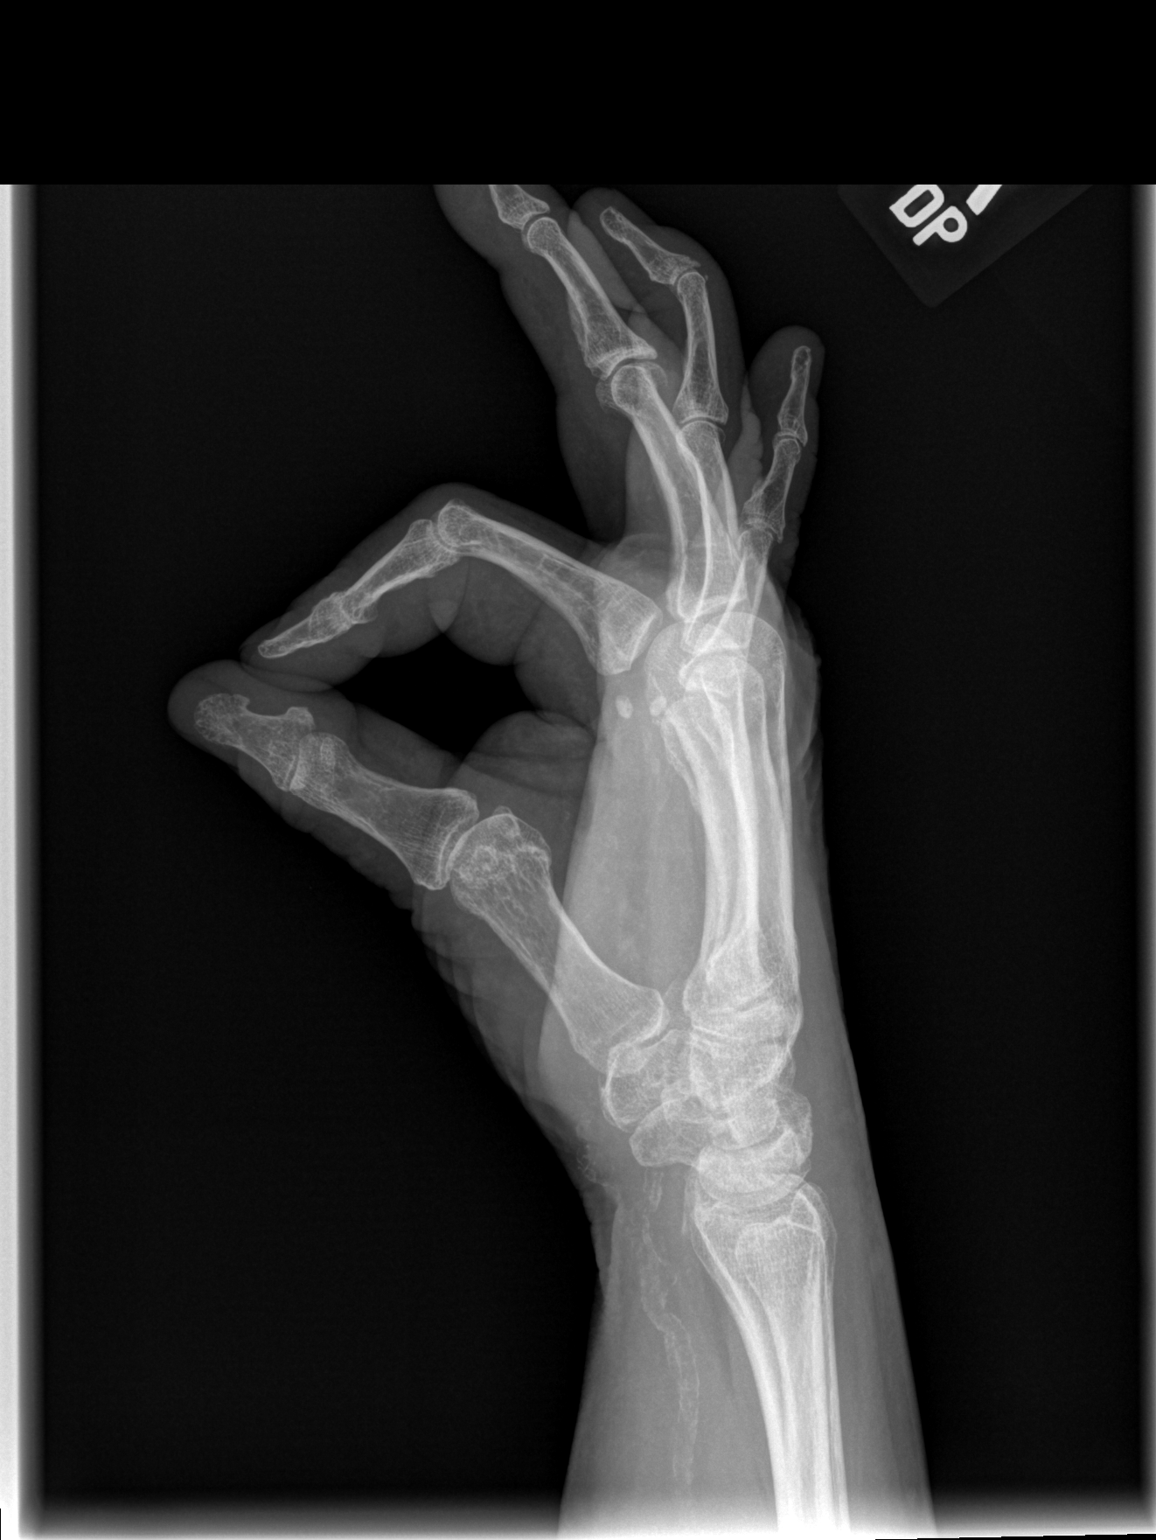

[3 of 3 positions shown; findings below may reference images not displayed]

FINDINGS: Osseous alignment is normal. No fracture line or displaced fracture
fragment seen. Mild degenerative change at the second and third PIP
joints. Vascular calcifications within the surrounding soft tissues.
IMPRESSION: No acute findings.  No osseous fracture or dislocation seen.

## 2018-03-28 IMAGING — CR DG CHEST 2V
2 series · 2 of 2 positions shown · non-contrast
Comparison: Chest x-ray dated 03/29/2015.

CLINICAL DATA: Hx of a fall 6 days ago, pain right side of chest,
cough

EXAM:
CHEST  2 VIEW

[w chest pa]
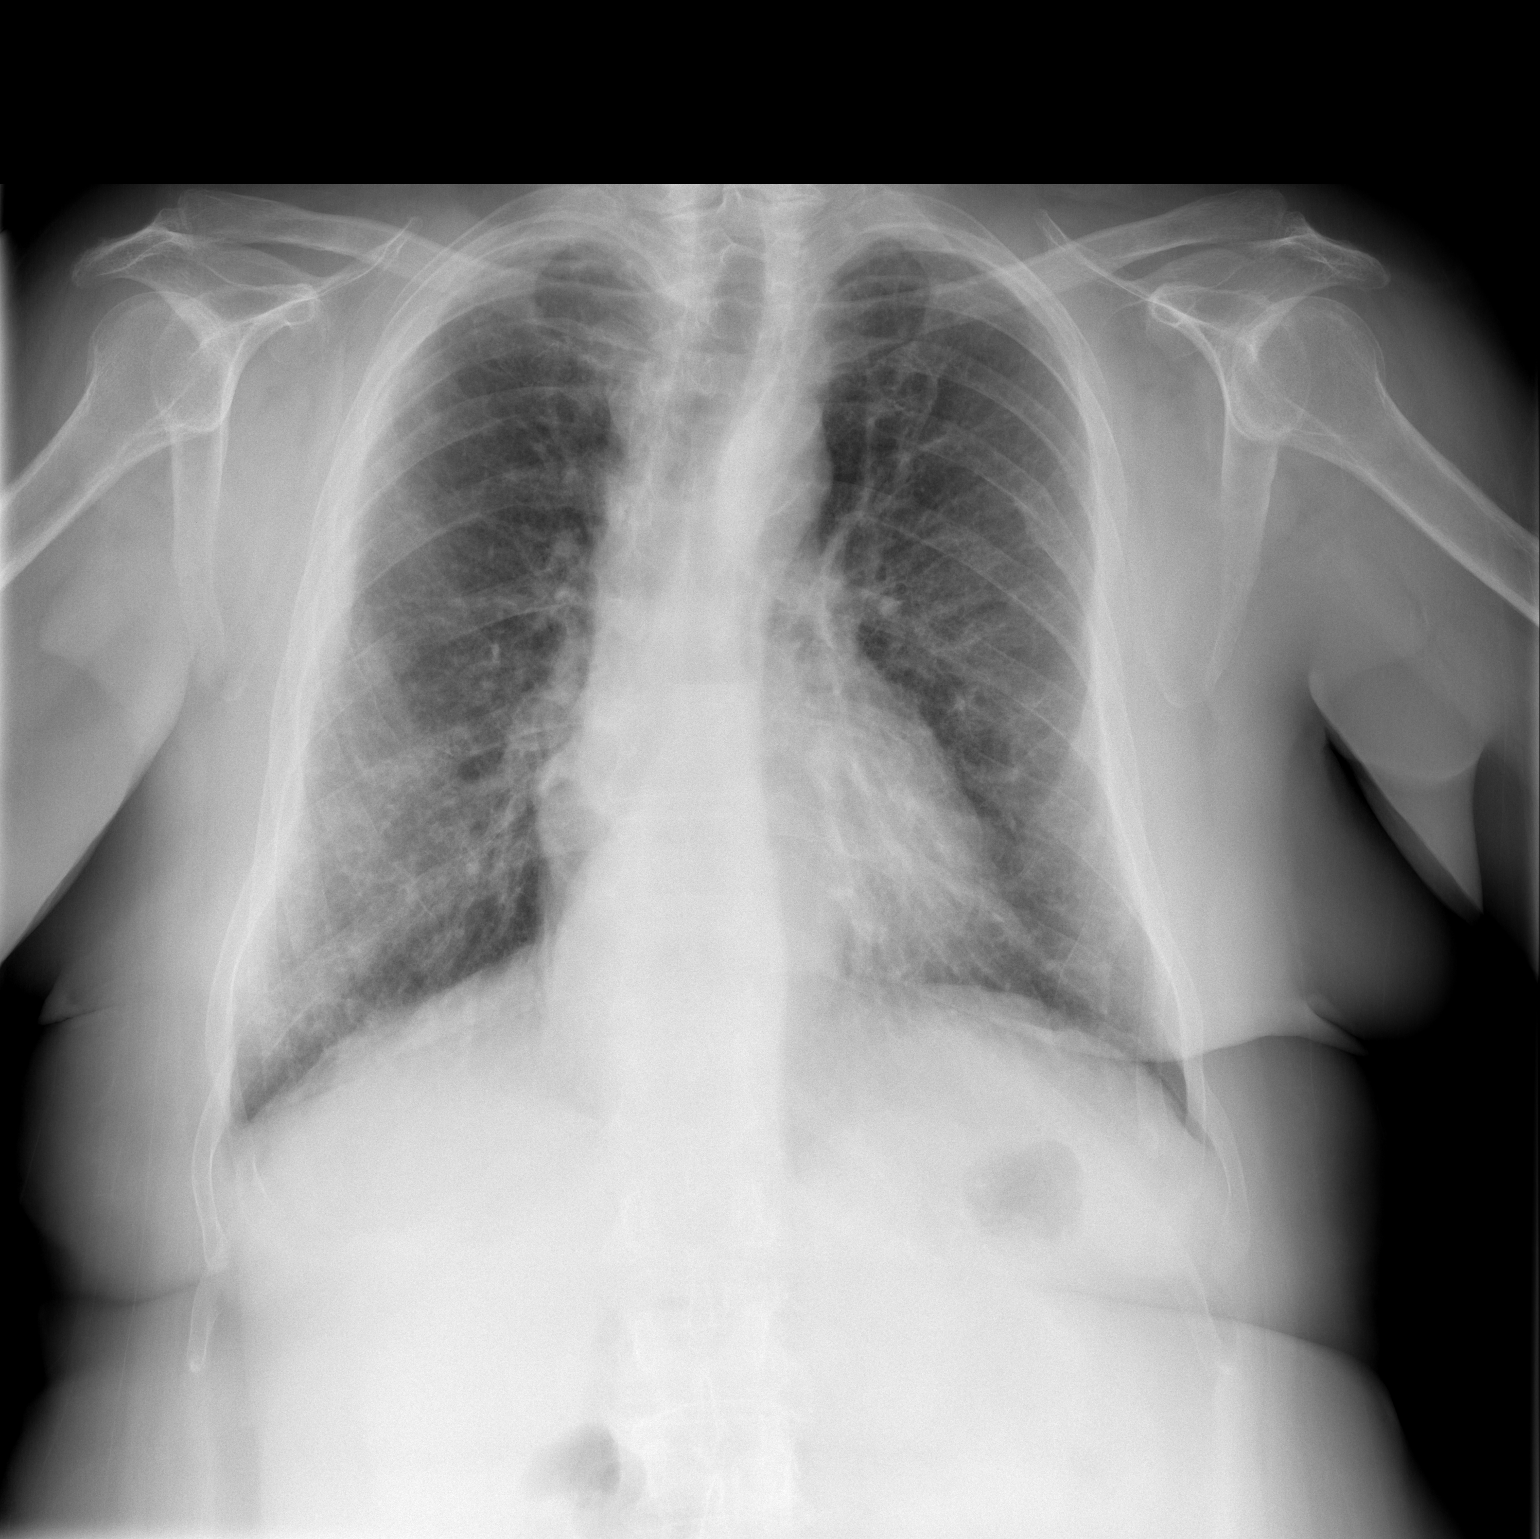

[w chest lat]
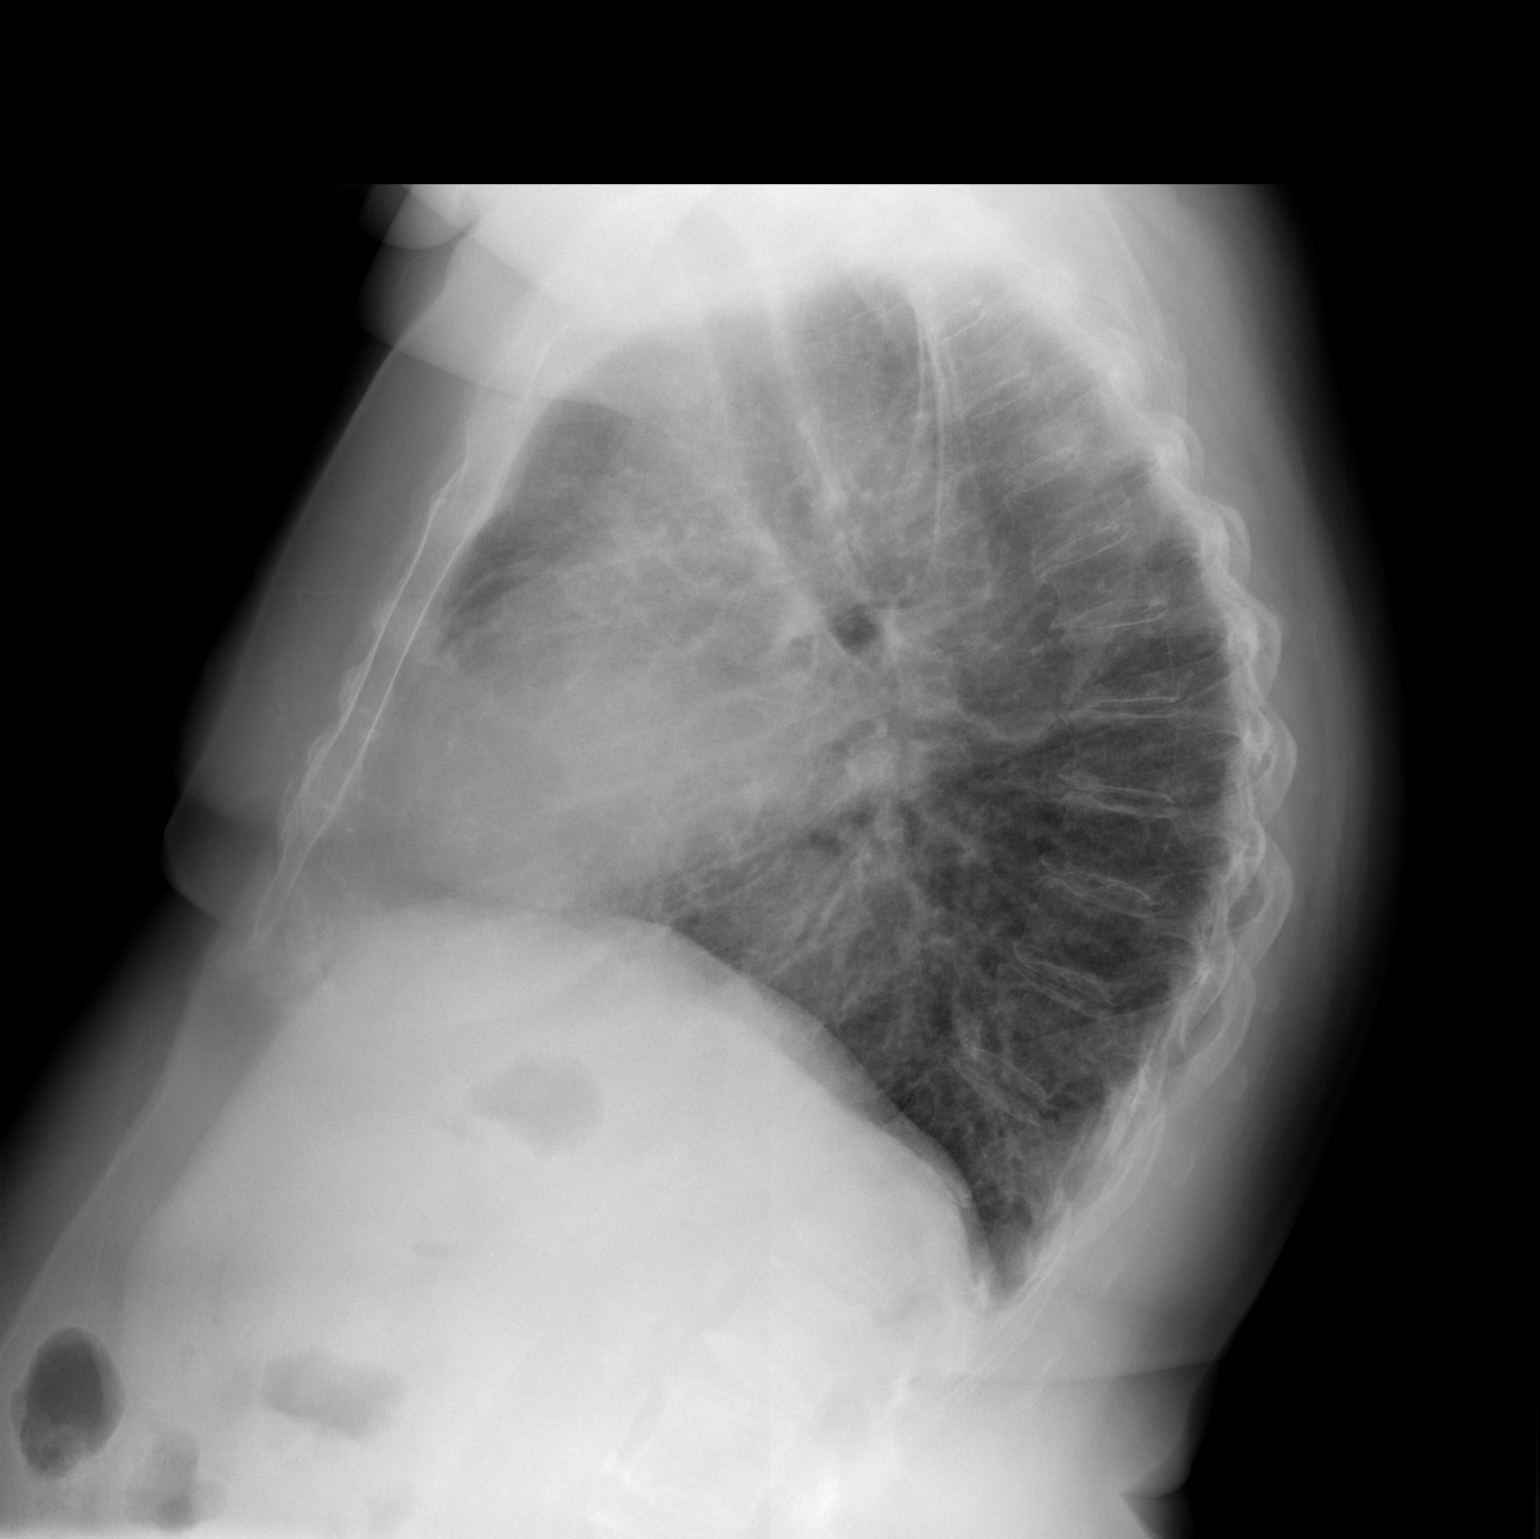

[2 of 2 positions shown; findings below may reference images not displayed]

FINDINGS: Heart size and mediastinal contours are stable. Coarse lung markings
bilaterally compatible with chronic interstitial lung disease and/or
chronic bronchitic change. No new lung findings. No pleural effusion
or pneumothorax seen.

Degenerative changes throughout the kyphotic thoracolumbar spine,
moderate in degree. Old healed rib fractures on the left. No acute
appearing fracture or dislocation seen.
IMPRESSION: 1. No acute findings. No acute appearing osseous fracture or
dislocation. No active cardiopulmonary disease.
2. Chronic interstitial lung disease and/or chronic bronchitic
changes.

## 2018-04-12 ENCOUNTER — Other Ambulatory Visit: Payer: Self-pay | Admitting: Endocrinology

## 2018-04-16 DIAGNOSIS — R071 Chest pain on breathing: Secondary | ICD-10-CM | POA: Diagnosis not present

## 2018-05-10 ENCOUNTER — Other Ambulatory Visit: Payer: Self-pay

## 2018-05-10 MED ORDER — FAMOTIDINE 40 MG PO TABS: 40.0000 mg | ORAL_TABLET | Freq: Every day | ORAL | 0 refills | Status: AC

## 2018-05-26 ENCOUNTER — Other Ambulatory Visit: Payer: Self-pay | Admitting: Gastroenterology

## 2018-05-26 DIAGNOSIS — K2270 Barrett's esophagus without dysplasia: Secondary | ICD-10-CM

## 2018-05-26 DIAGNOSIS — K219 Gastro-esophageal reflux disease without esophagitis: Secondary | ICD-10-CM

## 2018-06-02 ENCOUNTER — Telehealth: Payer: Self-pay

## 2018-06-02 NOTE — Telephone Encounter (Signed)
Front desk voiced concerns about pt slurring speech and stating she is falling. Asked if I would call pt to inquire further. LVM requesting returned call

## 2018-06-03 ENCOUNTER — Ambulatory Visit (INDEPENDENT_AMBULATORY_CARE_PROVIDER_SITE_OTHER): Payer: Medicare HMO | Admitting: Endocrinology

## 2018-06-03 ENCOUNTER — Telehealth: Payer: Self-pay | Admitting: Endocrinology

## 2018-06-03 DIAGNOSIS — E1151 Type 2 diabetes mellitus with diabetic peripheral angiopathy without gangrene: Secondary | ICD-10-CM

## 2018-06-03 DIAGNOSIS — Z794 Long term (current) use of insulin: Secondary | ICD-10-CM | POA: Diagnosis not present

## 2018-06-03 MED ORDER — LISINOPRIL 10 MG PO TABS: 10.0000 mg | ORAL_TABLET | Freq: Every day | ORAL | 3 refills | Status: AC

## 2018-06-03 NOTE — Telephone Encounter (Signed)
please contact [place] office.  Can they move up 5/22 appt to a sooner one?

## 2018-06-03 NOTE — Patient Instructions (Addendum)
check your blood sugar twice a day.  vary the time of day when you check, between before the 3 meals, and at bedtime.  also check if you have symptoms of your blood sugar being too high or too low.  please keep a record of the readings and bring it to your next appointment here (or you can bring the meter itself).  You can write it on any piece of paper.  please call us sooner if your blood sugar goes below 70, or if you have a lot of readings over 200.  Please resume the insulins.  I have asked the [place] office to move up new pt appt.  I have sent a prescription to your pharmacy, to refill the blood pressure pill.   Please take all of your meds, including the venlafaxine.   Please come back for a follow-up appointment in 2 weeks.

## 2018-06-03 NOTE — Progress Notes (Signed)
Subjective:    Patient ID: Yvonne Reynolds, female    DOB: 1945/07/23, 73 y.o.   MRN: [id number]  HPI  telehealth visit today via Phone x 12 minutes.   Alternatives to telehealth are presented to this patient, and the patient agrees to the telehealth visit. Pt is advised of the cost of the visit, and agrees to this, also.   Patient is at home, and I am at the office.   Persons attending the telehealth visit: the patient and I Pt returns for f/u of diabetes mellitus: DM type: Insulin-requiring type 2 Dx'ed: 0383 Complications: retinopathy and nephropathy.   Therapy: insulin since 1999.  GDM: never.  DKA: never.   Severe hypoglycemia: once, in mid-2017.   Pancreatitis: never.  Other: she declines multiple daily injections.  She takes she takes human insulin, due to cost.  She takes Reg QAM; the pattern of her cbg's says this is all she needs for the day.  She lives alone.  NOK is neighbor Yvonne Reynolds (pt does not know last name, and does not have ph# available).   Interval history: She stopped all of her meds 2 weeks ago, due to depression.  Denies SI.  She says cbg this am was 313.   Past Medical History:  Diagnosis Date  . Complication of anesthesia    states was hard to wake up after 1st EGD  . Depression   . GERD (gastroesophageal reflux disease)   . High cholesterol   . History of seizure    x 1 - due to hyperglycemia  . Hypertension    states under control with med., has been on med. > 20 yr.  . Insulin dependent diabetes mellitus ([place])   . Macular degeneration   . Nocturia   . Osteoarthritis    shoulder  . Osteoporosis   . Popping of temporomandibular joint on opening of jaw   . Sebaceous cyst 09/2014   chest wall - sternum  . Toenail fungus     Past Surgical History:  Procedure Laterality Date  . ABDOMINAL AORTOGRAM W/LOWER EXTREMITY Bilateral 10/30/2017   Procedure: ABDOMINAL AORTOGRAM W/LOWER EXTREMITY;  Surgeon: Elam Dutch, MD;  Location: [facility];   Service: Cardiovascular;  Laterality: Bilateral;  . ABDOMINAL HYSTERECTOMY     partial  . BLADDER SUSPENSION    . CATARACT EXTRACTION W/ INTRAOCULAR LENS  IMPLANT, BILATERAL Bilateral   . CESAREAN SECTION    . ESOPHAGOGASTRODUODENOSCOPY  05/07/2004  . ESOPHAGOGASTRODUODENOSCOPY (EGD) WITH PROPOFOL  07/06/2013  . LOWER EXTREMITY ANGIOGRAM Bilateral 04/12/2015   Procedure: Lower Extremity Angiogram;  Surgeon: Conrad Aviston, MD;  Location: [facility];  Service: Cardiovascular;  Laterality: Bilateral;  . MASS EXCISION N/A 10/17/2014   Procedure: EXCISION 2X3CM SUBCUTANEOUS CHEST WALL CYST;  Surgeon: Rolm Bookbinder, MD;  Location: [facility];  Service: General;  Laterality: N/A;  . ORIF ANKLE FRACTURE Left 07/23/2012   Procedure: OPEN REDUCTION INTERNAL FIXATION (ORIF) ANKLE FRACTURE;  Surgeon: Jessy Oto, MD;  Location: [facility];  Service: Orthopedics;  Laterality: Left;  . ORIF WRIST FRACTURE Left   . PERIPHERAL VASCULAR BALLOON ANGIOPLASTY  10/30/2017   Procedure: PERIPHERAL VASCULAR BALLOON ANGIOPLASTY;  Surgeon: Elam Dutch, MD;  Location: [facility];  Service: Cardiovascular;;  rt sfa  . PERIPHERAL VASCULAR CATHETERIZATION N/A 04/12/2015   Procedure: Abdominal Aortogram;  Surgeon: Conrad Lompico, MD;  Location: [facility];  Service: Cardiovascular;  Laterality: N/A;  . PERIPHERAL  VASCULAR CATHETERIZATION Right 04/12/2015   Procedure: Peripheral Vascular Balloon Angioplasty;  Surgeon: Conrad Hurricane, MD;  Location: [facility];  Service: Cardiovascular;  Laterality: Right;  rt sfa drug coated balloon    Social History   Socioeconomic History  . Marital status: Widowed    Spouse name: Not on file  . Number of children: 3  . Years of education: Not on file  . Highest education level: Not on file  Occupational History  . 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 Financial  resource strain: Not on file  . Food insecurity:    Worry: Not on file    Inability: Not on file  . Transportation needs:    Medical: Not on file    Non-medical: Not on file  Tobacco Use  . Smoking status: Former Smoker    Packs/day: 0.00    Last attempt to quit: 11/11/2011    Years since quitting: 6.5  . Smokeless tobacco: Never Used  Substance and Sexual Activity  . Alcohol use: No  . Drug use: No  . Sexual activity: Not on file  Lifestyle  . Physical activity:    Days per week: Not on file    Minutes per session: Not on file  . Stress: Not on file  Relationships  . Social connections:    Talks on phone: Not on file    Gets together: Not on file    Attends religious service: Not on file    Active member of club or organization: Not on file    Attends meetings of clubs or organizations: Not on file    Relationship status: Not on file  . Intimate partner violence:    Fear of current or ex partner: Not on file    Emotionally abused: Not on file    Physically abused: Not on file    Forced sexual activity: Not on file  Other Topics Concern  . Not on file  Social History Narrative   Widowed   Daily Caffeine Use-2 cups daily or more   Pt does not get regular exercise   Works Indust m[employer], 3rd shift    Current Outpatient Medications on File Prior to Visit  Medication Sig Dispense Refill  . Blood Glucose Monitoring Suppl (TRUE METRIX AIR GLUCOSE METER) W/DEVICE KIT 1 Device by Other route once. 1 kit 0  . CALCIUM CITRATE-VITAMIN D PO Take 2 tablets by mouth daily.     . cholecalciferol (VITAMIN D) 1000 units tablet Take 2,000 Units by mouth daily.     . famotidine (PEPCID) 40 MG tablet Take 1 tablet (40 mg total) by mouth at bedtime. 90 tablet 0  . insulin NPH Human (NOVOLIN N) 100 UNIT/ML injection Inject 0.1 mLs (10 Units total) into the skin at bedtime. 10 mL 11  . insulin regular (NOVOLIN R,HUMULIN R) 100 units/mL injection Inject 0.65 mLs (65 Units total) into the skin  daily. 20 mL 11  . INSULIN SYRINGE 1CC/29G (MONOJECT ULTRA COMFORT SYRINGE) 29G X 1/2" 1 ML MISC USED TO INJECT INSULIN SUBCUTANEOUSLY TWICE DAILY. 200 each 2  . omeprazole (PRILOSEC) 40 MG capsule TAKE 1 CAPSULE (40 MG TOTAL) BY MOUTH 2 (TWO) TIMES DAILY. 180 capsule 0  . rosuvastatin (CRESTOR) 10 MG tablet TAKE 1 TABLET EVERY DAY (Patient taking differently: Take 10 mg by mouth daily. ) 90 tablet 3  . TRUE METRIX BLOOD GLUCOSE TEST test strip  TEST TWO TIMES DAILY 200 each 3  . TRUEPLUS LANCETS 28G MISC TEST TWO TIMES DAILY 200 each 3  . venlafaxine XR (EFFEXOR-XR) 150 MG 24 hr capsule TAKE 1 CAPSULE (150 MG TOTAL) BY MOUTH DAILY WITH BREAKFAST. 90 capsule 11  . vitamin E (VITAMIN E) 400 UNIT capsule Take 400 Units by mouth daily.     No current facility-administered medications on file prior to visit.     Allergies  Allergen Reactions  . Procaine Other (See Comments)    Heart race  . Adhesive [Tape] Rash and Other (See Comments)    SKIN IRRITATION    Family History  Problem Relation Age of Onset  . Ovarian cancer Mother   . Stroke Father   . Diabetes Sister   . Stroke Sister   . Heart disease Sister        before age 35  . Diabetes Brother   . Heart disease Brother        before age 31  . Diabetes Sister      Review of Systems Denies n/v    Objective:   Physical Exam      Assessment & Plan:  Insulin-requiring type 2 DM, with DR.  Worse due to depression.   Patient Instructions  check your blood sugar twice a day.  vary the time of day when you check, between before the 3 meals, and at bedtime.  also check if you have symptoms of your blood sugar being too high or too low.  please keep a record of the readings and bring it to your next appointment here (or you can bring the meter itself).  You can write it on any piece of paper.  please call us sooner if your blood sugar goes below 70, or if you have a lot of readings over 200.  Please resume the insulins.  I have  asked the [facility] office to move up new pt appt.  I have sent a prescription to your pharmacy, to refill the blood pressure pill.   Please take all of your meds, including the venlafaxine.   Please come back for a follow-up appointment in 2 weeks.

## 2018-06-03 NOTE — Telephone Encounter (Signed)
Called Yvonne Reynolds's office to reschedule appt to a sooner date/time. Appt has been moved to 5/11 @ 3p. Called pt to make her aware. LVM requesting returned call.

## 2018-06-07 ENCOUNTER — Ambulatory Visit (INDEPENDENT_AMBULATORY_CARE_PROVIDER_SITE_OTHER): Payer: Medicare HMO | Admitting: Family

## 2018-06-07 ENCOUNTER — Other Ambulatory Visit: Payer: Self-pay

## 2018-06-07 ENCOUNTER — Encounter: Payer: Self-pay | Admitting: Family

## 2018-06-07 VITALS — BP 142/76 | HR 93 | Temp 98.7°F | Ht 61.0 in | Wt 161.0 lb

## 2018-06-07 DIAGNOSIS — Z794 Long term (current) use of insulin: Secondary | ICD-10-CM

## 2018-06-07 DIAGNOSIS — E1151 Type 2 diabetes mellitus with diabetic peripheral angiopathy without gangrene: Secondary | ICD-10-CM | POA: Diagnosis not present

## 2018-06-07 DIAGNOSIS — F329 Major depressive disorder, single episode, unspecified: Secondary | ICD-10-CM

## 2018-06-07 DIAGNOSIS — Z1231 Encounter for screening mammogram for malignant neoplasm of breast: Secondary | ICD-10-CM

## 2018-06-07 DIAGNOSIS — R296 Repeated falls: Secondary | ICD-10-CM

## 2018-06-07 DIAGNOSIS — E785 Hyperlipidemia, unspecified: Secondary | ICD-10-CM | POA: Diagnosis not present

## 2018-06-07 DIAGNOSIS — E1169 Type 2 diabetes mellitus with other specified complication: Secondary | ICD-10-CM | POA: Diagnosis not present

## 2018-06-07 DIAGNOSIS — R29818 Other symptoms and signs involving the nervous system: Secondary | ICD-10-CM

## 2018-06-07 DIAGNOSIS — F32A Depression, unspecified: Secondary | ICD-10-CM

## 2018-06-07 NOTE — Progress Notes (Signed)
Yvonne Reynolds is a 73 y.o. female with the following history as recorded in EpicCare:  Patient Active Problem List   Diagnosis Date Noted  . Contusion, chest wall 02/23/2017  . Contusion of right hand 02/23/2017  . Contusion of right forearm 02/23/2017  . Trigger finger 09/26/2016  . Acne 09/26/2016  . Skin nodule 02/21/2016  . Pustule 10/16/2015  . Diabetes ([place]) 10/15/2015  . PAD (peripheral artery disease) ([place]) 04/10/2015  . Foot ulcer ([place]) 03/05/2015  . Sleep disorder 10/12/2014  . Abscess of chest wall 09/14/2014  . Special screening for malignant neoplasms, colon 08/17/2014  . Screening examination for infectious disease 02/15/2014  . Anemia, iron deficiency 11/15/2013  . Hyperkalemia 08/16/2013  . Nausea and vomiting 09/18/2012  . Displaced trimalleolar fracture of left lower leg 07/23/2012    Class: Acute  . Encounter for long-term (current) use of other medications 06/29/2012  . Pain in joint, shoulder region 06/29/2012  . Zoster 06/07/2011  . Abdominal pain 06/03/2011  . PROTEINURIA, MILD 12/18/2008  . BENIGN NEOPLASM OF DUODENUM JEJUNUM AND ILEUM 12/22/2006  . Hyperlipidemia associated with type 2 diabetes mellitus ([place]) 12/22/2006  . OBSTRUCTIVE SLEEP APNEA 12/22/2006  . ALLERGIC RHINITIS 12/22/2006  . REFLUX ESOPHAGITIS 12/22/2006  . BARRETTS ESOPHAGUS 12/22/2006  . HIATAL HERNIA 12/22/2006  . BACK PAIN 12/21/2006  . Depression 06/26/2006  . HYPERTENSION 06/26/2006  . GERD 06/26/2006  . OSTEOARTHRITIS 06/26/2006  . Osteoporosis 06/26/2006    Current Outpatient Medications  Medication Sig Dispense Refill  . Blood Glucose Monitoring Suppl (TRUE METRIX AIR GLUCOSE METER) W/DEVICE KIT 1 Device by Other route once. 1 kit 0  . CALCIUM CITRATE-VITAMIN D PO Take 2 tablets by mouth daily.     . cholecalciferol (VITAMIN D) 1000 units tablet Take 2,000 Units by mouth daily.     . famotidine (PEPCID) 40 MG tablet Take 1 tablet (40 mg total) by mouth at bedtime.  90 tablet 0  . insulin NPH Human (NOVOLIN N) 100 UNIT/ML injection Inject 0.1 mLs (10 Units total) into the skin at bedtime. 10 mL 11  . insulin regular (NOVOLIN R,HUMULIN R) 100 units/mL injection Inject 0.65 mLs (65 Units total) into the skin daily. 20 mL 11  . INSULIN SYRINGE 1CC/29G (MONOJECT ULTRA COMFORT SYRINGE) 29G X 1/2" 1 ML MISC USED TO INJECT INSULIN SUBCUTANEOUSLY TWICE DAILY. 200 each 2  . lisinopril (ZESTRIL) 10 MG tablet Take 1 tablet (10 mg total) by mouth daily. 90 tablet 3  . omeprazole (PRILOSEC) 40 MG capsule TAKE 1 CAPSULE (40 MG TOTAL) BY MOUTH 2 (TWO) TIMES DAILY. 180 capsule 0  . rosuvastatin (CRESTOR) 10 MG tablet TAKE 1 TABLET EVERY DAY (Patient taking differently: Take 10 mg by mouth daily. ) 90 tablet 3  . TRUE METRIX BLOOD GLUCOSE TEST test strip TEST TWO TIMES DAILY 200 each 3  . TRUEPLUS LANCETS 28G MISC TEST TWO TIMES DAILY 200 each 3  . venlafaxine XR (EFFEXOR-XR) 150 MG 24 hr capsule TAKE 1 CAPSULE (150 MG TOTAL) BY MOUTH DAILY WITH BREAKFAST. 90 capsule 11  . vitamin E (VITAMIN E) 400 UNIT capsule Take 400 Units by mouth daily.     No current facility-administered medications for this visit.     Allergies: Procaine and Adhesive [tape]  Past Medical History:  Diagnosis Date  . Complication of anesthesia    states was hard to wake up after 1st EGD  . Depression   . GERD (gastroesophageal reflux disease)   . High cholesterol   .  History of seizure    x 1 - due to hyperglycemia  . Hypertension    states under control with med., has been on med. > 20 yr.  . Insulin dependent diabetes mellitus ([place])   . Macular degeneration   . Nocturia   . Osteoarthritis    shoulder  . Osteoporosis   . Popping of temporomandibular joint on opening of jaw   . Sebaceous cyst 09/2014   chest wall - sternum  . Toenail fungus     Past Surgical History:  Procedure Laterality Date  . ABDOMINAL AORTOGRAM W/LOWER EXTREMITY Bilateral 10/30/2017   Procedure: ABDOMINAL  AORTOGRAM W/LOWER EXTREMITY;  Surgeon: Elam Dutch, MD;  Location: [facility];  Service: Cardiovascular;  Laterality: Bilateral;  . ABDOMINAL HYSTERECTOMY     partial  . BLADDER SUSPENSION    . CATARACT EXTRACTION W/ INTRAOCULAR LENS  IMPLANT, BILATERAL Bilateral   . CESAREAN SECTION    . ESOPHAGOGASTRODUODENOSCOPY  05/07/2004  . ESOPHAGOGASTRODUODENOSCOPY (EGD) WITH PROPOFOL  07/06/2013  . LOWER EXTREMITY ANGIOGRAM Bilateral 04/12/2015   Procedure: Lower Extremity Angiogram;  Surgeon: Conrad Newaygo, MD;  Location: [facility];  Service: Cardiovascular;  Laterality: Bilateral;  . MASS EXCISION N/A 10/17/2014   Procedure: EXCISION 2X3CM SUBCUTANEOUS CHEST WALL CYST;  Surgeon: Rolm Bookbinder, MD;  Location: [facility];  Service: General;  Laterality: N/A;  . ORIF ANKLE FRACTURE Left 07/23/2012   Procedure: OPEN REDUCTION INTERNAL FIXATION (ORIF) ANKLE FRACTURE;  Surgeon: Jessy Oto, MD;  Location: [facility];  Service: Orthopedics;  Laterality: Left;  . ORIF WRIST FRACTURE Left   . PERIPHERAL VASCULAR BALLOON ANGIOPLASTY  10/30/2017   Procedure: PERIPHERAL VASCULAR BALLOON ANGIOPLASTY;  Surgeon: Elam Dutch, MD;  Location: [facility];  Service: Cardiovascular;;  rt sfa  . PERIPHERAL VASCULAR CATHETERIZATION N/A 04/12/2015   Procedure: Abdominal Aortogram;  Surgeon: Conrad New Cambria, MD;  Location: [facility];  Service: Cardiovascular;  Laterality: N/A;  . PERIPHERAL VASCULAR CATHETERIZATION Right 04/12/2015   Procedure: Peripheral Vascular Balloon Angioplasty;  Surgeon: Conrad Peoria, MD;  Location: [facility];  Service: Cardiovascular;  Laterality: Right;  rt sfa drug coated balloon    Family History  Problem Relation Age of Onset  . Ovarian cancer Mother   . Stroke Father   . Diabetes Sister   . Stroke Sister   . Heart disease Sister        before age 66  . Diabetes Brother   . Heart disease Brother        before age 31  .  Diabetes Sister     Social History   Tobacco Use  . Smoking status: Former Smoker    Packs/day: 0.00    Last attempt to quit: 11/11/2011    Years since quitting: 6.5  . Smokeless tobacco: Never Used  Substance Use Topics  . Alcohol use: No    Subjective:  Patient presents to transfer care from Dr. Loanne Drilling; he requested that patient be seen as soon as possible after his visit with her on 06/03/2018; she mentioned to Dr. Loanne Drilling that she had been having increased problems with falling- "feels like her balance is off." Very difficult historian- unable to tell me how long symptoms have occurred/ if she has actually fallen or just feels off balance; is adamant that she has never lost consciousness;   Diabetes is poorly controlled- last Hgba1c in his office was 10.9; admits that in the past  month she just "stopped taking all her medications" including her insulin; does not check her sugar regularly;   Is not on her blood pressure medication today- admits that she has not been taking her medication regularly for the past month; Denies any chest pain, shortness of breath, blurred vision or headache.  Takes Effexor XR 150 mg daily for depression- notes that she lives by herself; does not sound like she has a good support system- mentions that her family is not checking on her during the COVID restrictions/ has stressful relationship with neighbor;   Overdue for colonoscopy and mammogram; does not want to do colonoscopy; is okay to update order for mammogram;    Objective:  Vitals:   06/07/18 1620  BP: (!) 142/76  Pulse: 93  Temp: 98.7 F (37.1 C)  TempSrc: Oral  SpO2: 95%  Weight: 161 lb (73 kg)  Height: _0  (1.549 m)    General: Well developed, well nourished, in no acute distress  Skin : Warm and dry.  Head: Normocephalic and atraumatic  Eyes: Sclera and conjunctiva clear; pupils round and reactive to light; extraocular movements intact  Ears: External normal; canals clear; tympanic  membranes normal  Oropharynx: Pink, supple. No suspicious lesions  Neck: Supple without thyromegaly, adenopathy  Lungs: Respirations unlabored; clear to auscultation bilaterally without wheeze, rales, rhonchi  CVS exam: normal rate and regular rhythm.  Musculoskeletal: No deformities; no active joint inflammation  Extremities: No edema, cyanosis, clubbing  Vessels: Symmetric bilaterally  Neurologic: Alert and oriented; speech intact; face symmetrical; when walking in office, gait is unsteady and she drifts toward her right side  Assessment:  1. Recurrent falls while walking   2. Other symptoms and signs involving the nervous system   3. Type 2 diabetes mellitus with diabetic peripheral angiopathy without gangrene, with long-term current use of insulin ([place])   4. Screening mammogram, encounter for   5. Hyperlipidemia associated with type 2 diabetes mellitus ([place])   6. Depression, unspecified depression type     Plan:  1. Stressed need to get a cane for stability; will update MRI of brain; may need PT/ OT for balance/ strength; follow-up to be determined; 2. Refer to neurology for further evaluation; check CBC, CMP, B12, TSH;  3. Appears poorly controlled; not taking medication regularly- stressed need to take as prescribed; keep planned follow-up with her endocrinologist; due to cardiovascular complications, she is also referred back to cardiology; update Hgba1c today;  4. Order updated; 5. Stressed need to take her Lisinopril as prescribed; notes she is not on it today and has not taken in over a month;  6. Stressed to take her Crestor as prescribed; plan to check lipid panel; 7. Stressed to take Effexor daily as prescribed;   No follow-ups on file.  Orders Placed This Encounter  Procedures  . MR Brain Wo Contrast    Standing Status:   Future    Standing Expiration Date:   08/07/2019    Order Specific Question:   What is the patient's sedation requirement?    Answer:   No Sedation     Order Specific Question:   Does the patient have a pacemaker or implanted devices?    Answer:   No    Order Specific Question:   Preferred imaging location?    Answer:   [facility] (table limit-550lbs)    Order Specific Question:   Radiology Contrast Protocol - do NOT remove file path    Answer:   [email or web address]  .  MM Digital Screening    Standing Status:   Future    Standing Expiration Date:   08/07/2019    Order Specific Question:   Reason for Exam (SYMPTOM  OR DIAGNOSIS REQUIRED)    Answer:   screening mammogram    Order Specific Question:   Preferred imaging location?    Answer:   [facility]  . CBC w/Diff    Standing Status:   Future    Standing Expiration Date:   06/07/2019  . Comp Met (CMET)    Standing Status:   Future    Standing Expiration Date:   06/07/2019  . Lipid panel    Standing Status:   Future    Standing Expiration Date:   06/07/2019  . TSH    Standing Status:   Future    Standing Expiration Date:   06/07/2019  . B12    Standing Status:   Future    Standing Expiration Date:   06/07/2019  . Vitamin D (25 hydroxy)    Standing Status:   Future    Standing Expiration Date:   06/07/2019  . HgB A1c    Standing Status:   Future    Standing Expiration Date:   06/07/2019  . Ambulatory referral to Neurology    Referral Priority:   Routine    Referral Type:   Consultation    Referral Reason:   Specialty Services Required    Requested Specialty:   Neurology    Number of Visits Requested:   1  . Ambulatory referral to Cardiology    Referral Priority:   Routine    Referral Type:   Consultation    Referral Reason:   Specialty Services Required    Requested Specialty:   Cardiology    Number of Visits Requested:   1    Requested Prescriptions    No prescriptions requested or ordered in this encounter

## 2018-06-18 ENCOUNTER — Ambulatory Visit: Payer: Medicare HMO | Admitting: Family

## 2018-06-28 ENCOUNTER — Other Ambulatory Visit: Payer: Self-pay | Admitting: Endocrinology

## 2018-07-01 ENCOUNTER — Telehealth: Payer: Self-pay

## 2018-07-01 NOTE — Telephone Encounter (Signed)
LOV 06/03/18. Per Dr. Loanne Drilling, f/u in 2 weeks. Called pt to schedule appt. LVM requesting returned call.

## 2018-07-14 ENCOUNTER — Encounter: Payer: Self-pay | Admitting: Family

## 2018-07-15 ENCOUNTER — Encounter: Payer: Self-pay | Admitting: Family

## 2018-08-19 ENCOUNTER — Emergency Department (HOSPITAL_COMMUNITY): Payer: Medicare HMO

## 2018-08-19 ENCOUNTER — Inpatient Hospital Stay (HOSPITAL_COMMUNITY): Payer: Medicare HMO

## 2018-08-19 ENCOUNTER — Inpatient Hospital Stay (HOSPITAL_COMMUNITY)
Admission: EM | Admit: 2018-08-19 | Discharge: 2018-08-28 | DRG: 870 | Disposition: E | Payer: Medicare HMO | Attending: Pulmonary Disease | Admitting: Pulmonary Disease

## 2018-08-19 DIAGNOSIS — G40911 Epilepsy, unspecified, intractable, with status epilepticus: Secondary | ICD-10-CM | POA: Diagnosis present

## 2018-08-19 DIAGNOSIS — M19019 Primary osteoarthritis, unspecified shoulder: Secondary | ICD-10-CM | POA: Diagnosis present

## 2018-08-19 DIAGNOSIS — Z8041 Family history of malignant neoplasm of ovary: Secondary | ICD-10-CM

## 2018-08-19 DIAGNOSIS — E11649 Type 2 diabetes mellitus with hypoglycemia without coma: Secondary | ICD-10-CM | POA: Diagnosis present

## 2018-08-19 DIAGNOSIS — Z9289 Personal history of other medical treatment: Secondary | ICD-10-CM

## 2018-08-19 DIAGNOSIS — R402312 Coma scale, best motor response, none, at arrival to emergency department: Secondary | ICD-10-CM | POA: Diagnosis not present

## 2018-08-19 DIAGNOSIS — R579 Shock, unspecified: Secondary | ICD-10-CM

## 2018-08-19 DIAGNOSIS — Z8249 Family history of ischemic heart disease and other diseases of the circulatory system: Secondary | ICD-10-CM

## 2018-08-19 DIAGNOSIS — J90 Pleural effusion, not elsewhere classified: Secondary | ICD-10-CM | POA: Diagnosis not present

## 2018-08-19 DIAGNOSIS — M47812 Spondylosis without myelopathy or radiculopathy, cervical region: Secondary | ICD-10-CM | POA: Diagnosis not present

## 2018-08-19 DIAGNOSIS — R569 Unspecified convulsions: Secondary | ICD-10-CM | POA: Diagnosis not present

## 2018-08-19 DIAGNOSIS — G9341 Metabolic encephalopathy: Secondary | ICD-10-CM | POA: Diagnosis present

## 2018-08-19 DIAGNOSIS — J9601 Acute respiratory failure with hypoxia: Secondary | ICD-10-CM | POA: Diagnosis not present

## 2018-08-19 DIAGNOSIS — Z9114 Patient's other noncompliance with medication regimen: Secondary | ICD-10-CM

## 2018-08-19 DIAGNOSIS — R4182 Altered mental status, unspecified: Secondary | ICD-10-CM | POA: Diagnosis not present

## 2018-08-19 DIAGNOSIS — E1111 Type 2 diabetes mellitus with ketoacidosis with coma: Secondary | ICD-10-CM | POA: Diagnosis not present

## 2018-08-19 DIAGNOSIS — K760 Fatty (change of) liver, not elsewhere classified: Secondary | ICD-10-CM | POA: Diagnosis present

## 2018-08-19 DIAGNOSIS — I517 Cardiomegaly: Secondary | ICD-10-CM | POA: Diagnosis not present

## 2018-08-19 DIAGNOSIS — Z87891 Personal history of nicotine dependence: Secondary | ICD-10-CM

## 2018-08-19 DIAGNOSIS — K210 Gastro-esophageal reflux disease with esophagitis: Secondary | ICD-10-CM | POA: Diagnosis present

## 2018-08-19 DIAGNOSIS — E785 Hyperlipidemia, unspecified: Secondary | ICD-10-CM | POA: Diagnosis present

## 2018-08-19 DIAGNOSIS — I10 Essential (primary) hypertension: Secondary | ICD-10-CM | POA: Diagnosis not present

## 2018-08-19 DIAGNOSIS — Z20828 Contact with and (suspected) exposure to other viral communicable diseases: Secondary | ICD-10-CM | POA: Diagnosis not present

## 2018-08-19 DIAGNOSIS — R402112 Coma scale, eyes open, never, at arrival to emergency department: Secondary | ICD-10-CM | POA: Diagnosis present

## 2018-08-19 DIAGNOSIS — R402431 Glasgow coma scale score 3-8, in the field [EMT or ambulance]: Secondary | ICD-10-CM | POA: Diagnosis not present

## 2018-08-19 DIAGNOSIS — E1165 Type 2 diabetes mellitus with hyperglycemia: Secondary | ICD-10-CM | POA: Diagnosis not present

## 2018-08-19 DIAGNOSIS — Z884 Allergy status to anesthetic agent status: Secondary | ICD-10-CM

## 2018-08-19 DIAGNOSIS — G4733 Obstructive sleep apnea (adult) (pediatric): Secondary | ICD-10-CM | POA: Diagnosis present

## 2018-08-19 DIAGNOSIS — I63519 Cerebral infarction due to unspecified occlusion or stenosis of unspecified middle cerebral artery: Secondary | ICD-10-CM | POA: Diagnosis not present

## 2018-08-19 DIAGNOSIS — R680 Hypothermia, not associated with low environmental temperature: Secondary | ICD-10-CM | POA: Diagnosis not present

## 2018-08-19 DIAGNOSIS — K7200 Acute and subacute hepatic failure without coma: Secondary | ICD-10-CM | POA: Diagnosis not present

## 2018-08-19 DIAGNOSIS — L8990 Pressure ulcer of unspecified site, unspecified stage: Secondary | ICD-10-CM | POA: Insufficient documentation

## 2018-08-19 DIAGNOSIS — R351 Nocturia: Secondary | ICD-10-CM | POA: Diagnosis present

## 2018-08-19 DIAGNOSIS — N19 Unspecified kidney failure: Secondary | ICD-10-CM

## 2018-08-19 DIAGNOSIS — F329 Major depressive disorder, single episode, unspecified: Secondary | ICD-10-CM | POA: Diagnosis present

## 2018-08-19 DIAGNOSIS — J9690 Respiratory failure, unspecified, unspecified whether with hypoxia or hypercapnia: Secondary | ICD-10-CM | POA: Diagnosis not present

## 2018-08-19 DIAGNOSIS — E11319 Type 2 diabetes mellitus with unspecified diabetic retinopathy without macular edema: Secondary | ICD-10-CM | POA: Diagnosis present

## 2018-08-19 DIAGNOSIS — E1151 Type 2 diabetes mellitus with diabetic peripheral angiopathy without gangrene: Secondary | ICD-10-CM | POA: Diagnosis present

## 2018-08-19 DIAGNOSIS — N179 Acute kidney failure, unspecified: Secondary | ICD-10-CM | POA: Diagnosis present

## 2018-08-19 DIAGNOSIS — R402212 Coma scale, best verbal response, none, at arrival to emergency department: Secondary | ICD-10-CM | POA: Diagnosis present

## 2018-08-19 DIAGNOSIS — G40901 Epilepsy, unspecified, not intractable, with status epilepticus: Secondary | ICD-10-CM

## 2018-08-19 DIAGNOSIS — Z794 Long term (current) use of insulin: Secondary | ICD-10-CM

## 2018-08-19 DIAGNOSIS — I248 Other forms of acute ischemic heart disease: Secondary | ICD-10-CM | POA: Diagnosis present

## 2018-08-19 DIAGNOSIS — H3530 Unspecified macular degeneration: Secondary | ICD-10-CM | POA: Diagnosis present

## 2018-08-19 DIAGNOSIS — E860 Dehydration: Secondary | ICD-10-CM | POA: Diagnosis present

## 2018-08-19 DIAGNOSIS — E7800 Pure hypercholesterolemia, unspecified: Secondary | ICD-10-CM | POA: Diagnosis present

## 2018-08-19 DIAGNOSIS — Z03818 Encounter for observation for suspected exposure to other biological agents ruled out: Secondary | ICD-10-CM | POA: Diagnosis not present

## 2018-08-19 DIAGNOSIS — Z66 Do not resuscitate: Secondary | ICD-10-CM | POA: Diagnosis not present

## 2018-08-19 DIAGNOSIS — M810 Age-related osteoporosis without current pathological fracture: Secondary | ICD-10-CM | POA: Diagnosis present

## 2018-08-19 DIAGNOSIS — E1110 Type 2 diabetes mellitus with ketoacidosis without coma: Secondary | ICD-10-CM | POA: Diagnosis present

## 2018-08-19 DIAGNOSIS — Z823 Family history of stroke: Secondary | ICD-10-CM

## 2018-08-19 DIAGNOSIS — E1311 Other specified diabetes mellitus with ketoacidosis with coma: Secondary | ICD-10-CM | POA: Diagnosis not present

## 2018-08-19 DIAGNOSIS — M6282 Rhabdomyolysis: Secondary | ICD-10-CM | POA: Diagnosis present

## 2018-08-19 DIAGNOSIS — R6521 Severe sepsis with septic shock: Secondary | ICD-10-CM | POA: Diagnosis not present

## 2018-08-19 DIAGNOSIS — Z599 Problem related to housing and economic circumstances, unspecified: Secondary | ICD-10-CM

## 2018-08-19 DIAGNOSIS — A419 Sepsis, unspecified organism: Secondary | ICD-10-CM | POA: Diagnosis not present

## 2018-08-19 DIAGNOSIS — Z91048 Other nonmedicinal substance allergy status: Secondary | ICD-10-CM

## 2018-08-19 DIAGNOSIS — R404 Transient alteration of awareness: Secondary | ICD-10-CM | POA: Diagnosis not present

## 2018-08-19 DIAGNOSIS — Z833 Family history of diabetes mellitus: Secondary | ICD-10-CM

## 2018-08-19 DIAGNOSIS — J9811 Atelectasis: Secondary | ICD-10-CM | POA: Diagnosis not present

## 2018-08-19 DIAGNOSIS — Z452 Encounter for adjustment and management of vascular access device: Secondary | ICD-10-CM | POA: Diagnosis not present

## 2018-08-19 DIAGNOSIS — R188 Other ascites: Secondary | ICD-10-CM | POA: Diagnosis not present

## 2018-08-19 DIAGNOSIS — Z4682 Encounter for fitting and adjustment of non-vascular catheter: Secondary | ICD-10-CM | POA: Diagnosis not present

## 2018-08-19 DIAGNOSIS — R9431 Abnormal electrocardiogram [ECG] [EKG]: Secondary | ICD-10-CM | POA: Diagnosis not present

## 2018-08-19 DIAGNOSIS — R4020 Unspecified coma: Secondary | ICD-10-CM | POA: Diagnosis not present

## 2018-08-19 DIAGNOSIS — R0902 Hypoxemia: Secondary | ICD-10-CM | POA: Diagnosis not present

## 2018-08-19 DIAGNOSIS — Z9181 History of falling: Secondary | ICD-10-CM

## 2018-08-19 DIAGNOSIS — R578 Other shock: Secondary | ICD-10-CM | POA: Diagnosis not present

## 2018-08-19 LAB — CK: Total CK: 111 U/L (ref 38–234)

## 2018-08-19 LAB — URINALYSIS, ROUTINE W REFLEX MICROSCOPIC
Bilirubin Urine: NEGATIVE
Glucose, UA: 250 mg/dL — AB
Ketones, ur: NEGATIVE mg/dL
Nitrite: NEGATIVE
Protein, ur: >300 mg/dL — AB
Specific Gravity, Urine: 1.02 (ref 1.005–1.030)
pH: 6 (ref 5.0–8.0)

## 2018-08-19 LAB — POCT I-STAT 7, (LYTES, BLD GAS, ICA,H+H)
Acid-base deficit: 14 mmol/L — ABNORMAL HIGH (ref 0.0–2.0)
Bicarbonate: 13.2 mmol/L — ABNORMAL LOW (ref 20.0–28.0)
Calcium, Ion: 1.03 mmol/L — ABNORMAL LOW (ref 1.15–1.40)
HCT: 37 % (ref 36.0–46.0)
Hemoglobin: 12.6 g/dL (ref 12.0–15.0)
O2 Saturation: 100 %
Patient temperature: 98.6
Potassium: 5.3 mmol/L — ABNORMAL HIGH (ref 3.5–5.1)
Sodium: 143 mmol/L (ref 135–145)
TCO2: 14 mmol/L — ABNORMAL LOW (ref 22–32)
pCO2 arterial: 35.2 mmHg (ref 32.0–48.0)
pH, Arterial: 7.184 — CL (ref 7.350–7.450)
pO2, Arterial: 560 mmHg — ABNORMAL HIGH (ref 83.0–108.0)

## 2018-08-19 LAB — LACTIC ACID, PLASMA
Lactic Acid, Venous: 7.5 mmol/L (ref 0.5–1.9)
Lactic Acid, Venous: 5 mmol/L (ref 0.5–1.9)
Lactic Acid, Venous: 4.7 mmol/L (ref 0.5–1.9)
Lactic Acid, Venous: 5.9 mmol/L (ref 0.5–1.9)

## 2018-08-19 LAB — CBC WITH DIFFERENTIAL/PLATELET
Abs Immature Granulocytes: 0.52 10*3/uL — ABNORMAL HIGH (ref 0.00–0.07)
Basophils Absolute: 0 10*3/uL (ref 0.0–0.1)
Basophils Relative: 0 %
Eosinophils Absolute: 0 10*3/uL (ref 0.0–0.5)
Eosinophils Relative: 0 %
HCT: 35 % — ABNORMAL LOW (ref 36.0–46.0)
Hemoglobin: 10.2 g/dL — ABNORMAL LOW (ref 12.0–15.0)
Immature Granulocytes: 4 %
Lymphocytes Relative: 6 %
Lymphs Abs: 0.9 10*3/uL (ref 0.7–4.0)
MCH: 26.7 pg (ref 26.0–34.0)
MCHC: 29.1 g/dL — ABNORMAL LOW (ref 30.0–36.0)
MCV: 91.6 fL (ref 80.0–100.0)
Monocytes Absolute: 1.1 10*3/uL — ABNORMAL HIGH (ref 0.1–1.0)
Monocytes Relative: 8 %
Neutro Abs: 11.6 10*3/uL — ABNORMAL HIGH (ref 1.7–7.7)
Neutrophils Relative %: 82 %
Platelets: 268 10*3/uL (ref 150–400)
RBC: 3.82 MIL/uL — ABNORMAL LOW (ref 3.87–5.11)
RDW: 14.9 % (ref 11.5–15.5)
WBC: 14.1 10*3/uL — ABNORMAL HIGH (ref 4.0–10.5)
nRBC: 0 % (ref 0.0–0.2)

## 2018-08-19 LAB — BASIC METABOLIC PANEL
Anion gap: 16 — ABNORMAL HIGH (ref 5–15)
BUN: 82 mg/dL — ABNORMAL HIGH (ref 8–23)
CO2: 11 mmol/L — ABNORMAL LOW (ref 22–32)
Calcium: 6.9 mg/dL — ABNORMAL LOW (ref 8.9–10.3)
Chloride: 115 mmol/L — ABNORMAL HIGH (ref 98–111)
Creatinine, Ser: 2.99 mg/dL — ABNORMAL HIGH (ref 0.44–1.00)
GFR calc Af Amer: 17 mL/min — ABNORMAL LOW (ref >60)
GFR calc non Af Amer: 15 mL/min — ABNORMAL LOW (ref >60)
Glucose, Bld: 641 mg/dL (ref 70–99)
Potassium: 4.1 mmol/L (ref 3.5–5.1)
Sodium: 142 mmol/L (ref 135–145)

## 2018-08-19 LAB — PROTIME-INR
INR: 1.6 — ABNORMAL HIGH (ref 0.8–1.2)
Prothrombin Time: 18.6 seconds — ABNORMAL HIGH (ref 11.4–15.2)

## 2018-08-19 LAB — COMPREHENSIVE METABOLIC PANEL
ALT: 37 U/L (ref 0–44)
AST: 64 U/L — ABNORMAL HIGH (ref 15–41)
Albumin: 2.2 g/dL — ABNORMAL LOW (ref 3.5–5.0)
Alkaline Phosphatase: 72 U/L (ref 38–126)
Anion gap: 21 — ABNORMAL HIGH (ref 5–15)
BUN: 53 mg/dL — ABNORMAL HIGH (ref 8–23)
CO2: 7 mmol/L — ABNORMAL LOW (ref 22–32)
Calcium: 5.5 mg/dL — CL (ref 8.9–10.3)
Chloride: 120 mmol/L — ABNORMAL HIGH (ref 98–111)
Creatinine, Ser: 2.31 mg/dL — ABNORMAL HIGH (ref 0.44–1.00)
GFR calc Af Amer: 24 mL/min — ABNORMAL LOW (ref >60)
GFR calc non Af Amer: 21 mL/min — ABNORMAL LOW (ref >60)
Glucose, Bld: 579 mg/dL (ref 70–99)
Potassium: 4.3 mmol/L (ref 3.5–5.1)
Sodium: 148 mmol/L — ABNORMAL HIGH (ref 135–145)
Total Bilirubin: 1.3 mg/dL — ABNORMAL HIGH (ref 0.3–1.2)
Total Protein: 4.7 g/dL — ABNORMAL LOW (ref 6.5–8.1)

## 2018-08-19 LAB — GLUCOSE, CAPILLARY
Glucose-Capillary: >600 mg/dL (ref 70–99)
Glucose-Capillary: >600 mg/dL (ref 70–99)
Glucose-Capillary: 513 mg/dL (ref 70–99)
Glucose-Capillary: 562 mg/dL (ref 70–99)
Glucose-Capillary: 507 mg/dL (ref 70–99)
Glucose-Capillary: 481 mg/dL — ABNORMAL HIGH (ref 70–99)
Glucose-Capillary: 425 mg/dL — ABNORMAL HIGH (ref 70–99)

## 2018-08-19 LAB — APTT: aPTT: 27 seconds (ref 24–36)

## 2018-08-19 LAB — SALICYLATE LEVEL: Salicylate Lvl: <7 mg/dL (ref 2.8–30.0)

## 2018-08-19 LAB — URINALYSIS, MICROSCOPIC (REFLEX)
RBC / HPF: >50 RBC/hpf (ref 0–5)
WBC, UA: >50 WBC/hpf (ref 0–5)

## 2018-08-19 LAB — CBG MONITORING, ED: Glucose-Capillary: >600 mg/dL (ref 70–99)

## 2018-08-19 LAB — TROPONIN I (HIGH SENSITIVITY)
Troponin I (High Sensitivity): 56 ng/L — ABNORMAL HIGH (ref <18)
Troponin I (High Sensitivity): 63 ng/L — ABNORMAL HIGH (ref <18)

## 2018-08-19 LAB — PHOSPHORUS: Phosphorus: 4.6 mg/dL (ref 2.5–4.6)

## 2018-08-19 LAB — SARS CORONAVIRUS 2 BY RT PCR (HOSPITAL ORDER, PERFORMED IN ~~LOC~~ HOSPITAL LAB): SARS Coronavirus 2: NEGATIVE

## 2018-08-19 LAB — ETHANOL: Alcohol, Ethyl (B): <10 mg/dL (ref <10)

## 2018-08-19 LAB — ACETAMINOPHEN LEVEL: Acetaminophen (Tylenol), Serum: <10 ug/mL — ABNORMAL LOW (ref 10–30)

## 2018-08-19 LAB — MAGNESIUM: Magnesium: 2.3 mg/dL (ref 1.7–2.4)

## 2018-08-19 LAB — BETA-HYDROXYBUTYRIC ACID: Beta-Hydroxybutyric Acid: 7.94 mmol/L — ABNORMAL HIGH (ref 0.05–0.27)

## 2018-08-19 MED ORDER — SODIUM CHLORIDE 0.9 % IV SOLN
750.0000 mg | Freq: Two times a day (BID) | INTRAVENOUS | Status: DC
  Administered 2018-08-20 – 2018-08-22 (×5): 750 mg via INTRAVENOUS
  Filled 2018-08-19 (×9): qty 7.5

## 2018-08-19 MED ORDER — LORAZEPAM 2 MG/ML IJ SOLN
INTRAMUSCULAR | Status: AC
  Filled 2018-08-19: qty 1

## 2018-08-19 MED ORDER — SODIUM CHLORIDE 0.9 % IV BOLUS
30.0000 mL/kg | Freq: Once | INTRAVENOUS | Status: AC
  Administered 2018-08-19: 12:00:00 2190 mL via INTRAVENOUS

## 2018-08-19 MED ORDER — INSULIN REGULAR(HUMAN) IN NACL 100-0.9 UT/100ML-% IV SOLN: INTRAVENOUS | Status: DC

## 2018-08-19 MED ORDER — LORAZEPAM 2 MG/ML IJ SOLN
1.0000 mg | Freq: Once | INTRAMUSCULAR | Status: AC
  Administered 2018-08-19: 12:00:00 1 mg via INTRAVENOUS

## 2018-08-19 MED ORDER — ORAL CARE MOUTH RINSE
15.0000 mL | OROMUCOSAL | Status: DC
  Administered 2018-08-19 – 2018-08-23 (×37): 15 mL via OROMUCOSAL

## 2018-08-19 MED ORDER — FENTANYL CITRATE (PF) 100 MCG/2ML IJ SOLN
50.0000 ug | Freq: Once | INTRAMUSCULAR | Status: AC
  Administered 2018-08-19: 50 ug via INTRAVENOUS
  Filled 2018-08-19: qty 2

## 2018-08-19 MED ORDER — SODIUM CHLORIDE 0.9 % IV BOLUS
1000.0000 mL | Freq: Once | INTRAVENOUS | Status: AC
  Administered 2018-08-19: 12:00:00 1000 mL via INTRAVENOUS

## 2018-08-19 MED ORDER — SODIUM CHLORIDE 0.9 % IV SOLN
INTRAVENOUS | Status: DC | PRN
  Administered 2018-08-19: 19:00:00 250 mL via INTRAVENOUS

## 2018-08-19 MED ORDER — ENOXAPARIN SODIUM 30 MG/0.3ML ~~LOC~~ SOLN
30.0000 mg | SUBCUTANEOUS | Status: DC
  Administered 2018-08-19 – 2018-08-22 (×4): 30 mg via SUBCUTANEOUS
  Filled 2018-08-19 (×4): qty 0.3

## 2018-08-19 MED ORDER — LEVETIRACETAM 500 MG PO TABS: 1500.0000 mg | ORAL_TABLET | Freq: Once | ORAL | Status: DC

## 2018-08-19 MED ORDER — SODIUM CHLORIDE 0.9% FLUSH: 10.0000 mL | INTRAVENOUS | Status: DC | PRN

## 2018-08-19 MED ORDER — LACTATED RINGERS IV BOLUS
1000.0000 mL | Freq: Once | INTRAVENOUS | Status: AC
  Administered 2018-08-19: 19:00:00 1000 mL via INTRAVENOUS

## 2018-08-19 MED ORDER — SODIUM CHLORIDE 0.9% FLUSH
10.0000 mL | Freq: Two times a day (BID) | INTRAVENOUS | Status: DC
  Administered 2018-08-19 – 2018-08-23 (×6): 10 mL

## 2018-08-19 MED ORDER — FLUCONAZOLE 40 MG/ML PO SUSR
150.0000 mg | Freq: Once | ORAL | Status: AC
  Administered 2018-08-19: 18:00:00 152 mg
  Filled 2018-08-19: qty 3.8

## 2018-08-19 MED ORDER — SODIUM CHLORIDE 0.9 % IV BOLUS
1000.0000 mL | Freq: Once | INTRAVENOUS | Status: AC
  Administered 2018-08-19: 15:00:00 1000 mL via INTRAVENOUS

## 2018-08-19 MED ORDER — CHLORHEXIDINE GLUCONATE CLOTH 2 % EX PADS
6.0000 | MEDICATED_PAD | Freq: Every day | CUTANEOUS | Status: DC
  Administered 2018-08-20 – 2018-08-21 (×3): 6 via TOPICAL

## 2018-08-19 MED ORDER — SODIUM CHLORIDE 0.9 % IV SOLN: 250.0000 mL | INTRAVENOUS | Status: DC

## 2018-08-19 MED ORDER — PROPOFOL 1000 MG/100ML IV EMUL
5.0000 ug/kg/min | INTRAVENOUS | Status: AC
  Administered 2018-08-19: 13:00:00 5 ug/kg/min via INTRAVENOUS
  Administered 2018-08-19: 50 ug/kg/min via INTRAVENOUS
  Administered 2018-08-20: 15:00:00 80 ug/kg/min via INTRAVENOUS
  Administered 2018-08-20: 02:00:00 30 ug/kg/min via INTRAVENOUS
  Administered 2018-08-20: 80 ug/kg/min via INTRAVENOUS
  Administered 2018-08-20: 60 ug/kg/min via INTRAVENOUS
  Administered 2018-08-20: 70 ug/kg/min via INTRAVENOUS
  Administered 2018-08-20 – 2018-08-21 (×5): 80 ug/kg/min via INTRAVENOUS
  Filled 2018-08-19 (×5): qty 100
  Filled 2018-08-19: qty 200
  Filled 2018-08-19 (×4): qty 100

## 2018-08-19 MED ORDER — CHLORHEXIDINE GLUCONATE 0.12% ORAL RINSE (MEDLINE KIT)
15.0000 mL | Freq: Two times a day (BID) | OROMUCOSAL | Status: DC
  Administered 2018-08-19 – 2018-08-23 (×8): 15 mL via OROMUCOSAL

## 2018-08-19 MED ORDER — LEVETIRACETAM IN NACL 1000 MG/100ML IV SOLN
1000.0000 mg | Freq: Once | INTRAVENOUS | Status: AC
  Administered 2018-08-19: 1000 mg via INTRAVENOUS
  Filled 2018-08-19: qty 100

## 2018-08-19 MED ORDER — METRONIDAZOLE IN NACL 5-0.79 MG/ML-% IV SOLN
500.0000 mg | Freq: Once | INTRAVENOUS | Status: AC
  Administered 2018-08-19: 14:00:00 500 mg via INTRAVENOUS
  Filled 2018-08-19: qty 100

## 2018-08-19 MED ORDER — DEXTROSE-NACL 5-0.45 % IV SOLN: INTRAVENOUS | Status: DC

## 2018-08-19 MED ORDER — DEXTROSE IN LACTATED RINGERS 5 % IV SOLN: INTRAVENOUS | Status: DC

## 2018-08-19 MED ORDER — SODIUM CHLORIDE 0.9 % IV SOLN
100.0000 mg | Freq: Two times a day (BID) | INTRAVENOUS | Status: DC
  Filled 2018-08-19: qty 10

## 2018-08-19 MED ORDER — SODIUM CHLORIDE 0.9 % IV SOLN
2.0000 g | INTRAVENOUS | Status: DC
  Administered 2018-08-20: 2 g via INTRAVENOUS
  Filled 2018-08-19: qty 2

## 2018-08-19 MED ORDER — LACTATED RINGERS IV SOLN
INTRAVENOUS | Status: DC
  Administered 2018-08-19 – 2018-08-20 (×2): via INTRAVENOUS

## 2018-08-19 MED ORDER — CALCIUM GLUCONATE-NACL 1-0.675 GM/50ML-% IV SOLN
1.0000 g | Freq: Once | INTRAVENOUS | Status: AC
  Administered 2018-08-19: 15:00:00 1000 mg via INTRAVENOUS
  Filled 2018-08-19: qty 50

## 2018-08-19 MED ORDER — VANCOMYCIN HCL IN DEXTROSE 1-5 GM/200ML-% IV SOLN
1000.0000 mg | Freq: Once | INTRAVENOUS | Status: DC
  Filled 2018-08-19: qty 200

## 2018-08-19 MED ORDER — PANTOPRAZOLE SODIUM 40 MG IV SOLR
40.0000 mg | Freq: Every day | INTRAVENOUS | Status: DC
  Administered 2018-08-19: 22:00:00 40 mg via INTRAVENOUS
  Filled 2018-08-19: qty 40

## 2018-08-19 MED ORDER — NOREPINEPHRINE 4 MG/250ML-% IV SOLN
0.0000 ug/min | INTRAVENOUS | Status: DC
  Administered 2018-08-19: 7 ug/min via INTRAVENOUS
  Administered 2018-08-19: 12:00:00 2 ug/min via INTRAVENOUS
  Administered 2018-08-20 (×2): 15 ug/min via INTRAVENOUS
  Administered 2018-08-20: 05:00:00 11 ug/min via INTRAVENOUS
  Administered 2018-08-20 (×2): 15 ug/min via INTRAVENOUS
  Administered 2018-08-21: 07:00:00 25 ug/min via INTRAVENOUS
  Filled 2018-08-19 (×8): qty 250

## 2018-08-19 MED ORDER — ETOMIDATE 2 MG/ML IV SOLN
INTRAVENOUS | Status: AC | PRN
  Administered 2018-08-19: 10 mg via INTRAVENOUS

## 2018-08-19 MED ORDER — LACTATED RINGERS IV BOLUS
1000.0000 mL | Freq: Once | INTRAVENOUS | Status: AC
  Administered 2018-08-19: 18:00:00 1000 mL via INTRAVENOUS

## 2018-08-19 MED ORDER — PROPOFOL 1000 MG/100ML IV EMUL
INTRAVENOUS | Status: AC
  Filled 2018-08-19: qty 100

## 2018-08-19 MED ORDER — INSULIN REGULAR(HUMAN) IN NACL 100-0.9 UT/100ML-% IV SOLN
INTRAVENOUS | Status: DC
  Administered 2018-08-19: 18:00:00 5.4 [IU]/h via INTRAVENOUS
  Administered 2018-08-20: 24 [IU]/h via INTRAVENOUS
  Filled 2018-08-19 (×4): qty 100

## 2018-08-19 MED ORDER — SODIUM CHLORIDE 0.9 % IV SOLN
1.0000 g | Freq: Once | INTRAVENOUS | Status: DC
  Filled 2018-08-19: qty 10

## 2018-08-19 MED ORDER — LEVETIRACETAM IN NACL 1500 MG/100ML IV SOLN
1500.0000 mg | Freq: Once | INTRAVENOUS | Status: AC
  Administered 2018-08-19: 1500 mg via INTRAVENOUS
  Filled 2018-08-19: qty 100

## 2018-08-19 MED ORDER — SODIUM CHLORIDE 0.9 % IV SOLN
200.0000 mg | Freq: Once | INTRAVENOUS | Status: AC
  Administered 2018-08-19: 23:00:00 200 mg via INTRAVENOUS
  Filled 2018-08-19: qty 20

## 2018-08-19 MED ORDER — ROCURONIUM BROMIDE 50 MG/5ML IV SOLN
INTRAVENOUS | Status: AC | PRN
  Administered 2018-08-19: 70 mg via INTRAVENOUS

## 2018-08-19 MED ORDER — ACETAMINOPHEN 325 MG PO TABS: 650.0000 mg | ORAL_TABLET | ORAL | Status: DC | PRN

## 2018-08-19 MED ORDER — VANCOMYCIN HCL 10 G IV SOLR
1500.0000 mg | Freq: Once | INTRAVENOUS | Status: AC
  Administered 2018-08-19: 21:00:00 1500 mg via INTRAVENOUS
  Filled 2018-08-19 (×2): qty 1500

## 2018-08-19 MED ORDER — VANCOMYCIN HCL IN DEXTROSE 1-5 GM/200ML-% IV SOLN
1000.0000 mg | INTRAVENOUS | Status: DC
  Filled 2018-08-19: qty 200

## 2018-08-19 MED ORDER — VENLAFAXINE HCL ER 150 MG PO CP24
150.0000 mg | ORAL_CAPSULE | Freq: Every day | ORAL | Status: DC
  Filled 2018-08-19: qty 2
  Filled 2018-08-19: qty 1

## 2018-08-19 MED ORDER — SODIUM CHLORIDE 0.9 % IV SOLN
2.0000 g | Freq: Once | INTRAVENOUS | Status: AC
  Administered 2018-08-19: 2 g via INTRAVENOUS
  Filled 2018-08-19: qty 2

## 2018-08-19 NOTE — Consult Note (Addendum)
Requesting Physician: Dr. Ralene Bathe, EDP    Chief Complaint: "found down, concern for seizure"  History obtained from: Chart , unable to obtain from patient due to mental/medical status, intubated   HPI:                                                                                                                                       Yvonne Reynolds is a 73 y.o. female with a PMHx notable for insulin dependent DMII, HLD, MDD, recurrent falls of recent who presents to the [facility] ED after being found by EMS in her home. Per report, a neighbor heard her calling out to her dog yesterday at 3pm. Since that time it is not known if she had been in contact with anyone. I personally spoke with the patients listed contact person, her grandson Vernia Buff, who reports that approximately 3-4 weeks prior she had become lost while driving to a routine appointment. Although she has reportedly always been easily turned around and distractible this was unusual for her. It was so severe that when her grandson found her he felt that her driving was unsafe and decided to drive her home himself. He does not recall any other specific more recent changes. She was intubated in the ED for airway protection as there was concern she was seizing with posturing of the right arm  Past Medical History:  Diagnosis Date  . Complication of anesthesia    states was hard to wake up after 1st EGD  . Depression   . GERD (gastroesophageal reflux disease)   . High cholesterol   . History of seizure    x 1 - due to hyperglycemia  . Hypertension    states under control with med., has been on med. > 20 yr.  . Insulin dependent diabetes mellitus ([place])   . Macular degeneration   . Nocturia   . Osteoarthritis    shoulder  . Osteoporosis   . Popping of temporomandibular joint on opening of jaw   . Sebaceous cyst 09/2014   chest wall - sternum  . Toenail fungus     Past Surgical History:  Procedure Laterality Date  . ABDOMINAL  AORTOGRAM W/LOWER EXTREMITY Bilateral 10/30/2017   Procedure: ABDOMINAL AORTOGRAM W/LOWER EXTREMITY;  Surgeon: Elam Dutch, MD;  Location: [place] [facility];  Service: Cardiovascular;  Laterality: Bilateral;  . ABDOMINAL HYSTERECTOMY     partial  . BLADDER SUSPENSION    . CATARACT EXTRACTION W/ INTRAOCULAR LENS  IMPLANT, BILATERAL Bilateral   . CESAREAN SECTION    . ESOPHAGOGASTRODUODENOSCOPY  05/07/2004  . ESOPHAGOGASTRODUODENOSCOPY (EGD) WITH PROPOFOL  07/06/2013  . LOWER EXTREMITY ANGIOGRAM Bilateral 04/12/2015   Procedure: Lower Extremity Angiogram;  Surgeon: Conrad Tanaina, MD;  Location: [place] [facility];  Service: Cardiovascular;  Laterality: Bilateral;  . MASS EXCISION N/A 10/17/2014   Procedure: EXCISION 2X3CM SUBCUTANEOUS CHEST WALL CYST;  Surgeon:  Rolm Bookbinder, MD;  Location: [place];  Service: General;  Laterality: N/A;  . ORIF ANKLE FRACTURE Left 07/23/2012   Procedure: OPEN REDUCTION INTERNAL FIXATION (ORIF) ANKLE FRACTURE;  Surgeon: Jessy Oto, MD;  Location: [place];  Service: Orthopedics;  Laterality: Left;  . ORIF WRIST FRACTURE Left   . PERIPHERAL VASCULAR BALLOON ANGIOPLASTY  10/30/2017   Procedure: PERIPHERAL VASCULAR BALLOON ANGIOPLASTY;  Surgeon: Elam Dutch, MD;  Location: [place] [facility];  Service: Cardiovascular;;  rt sfa  . PERIPHERAL VASCULAR CATHETERIZATION N/A 04/12/2015   Procedure: Abdominal Aortogram;  Surgeon: Conrad [place], MD;  Location: [place] [facility];  Service: Cardiovascular;  Laterality: N/A;  . PERIPHERAL VASCULAR CATHETERIZATION Right 04/12/2015   Procedure: Peripheral Vascular Balloon Angioplasty;  Surgeon: Conrad , MD;  Location: [place] [facility];  Service: Cardiovascular;  Laterality: Right;  rt sfa drug coated balloon    Family History  Problem Relation Age of Onset  . Ovarian cancer Mother   . Stroke Father   . Diabetes Sister   . Stroke Sister   . Heart disease Sister        before age 74  .  Diabetes Brother   . Heart disease Brother        before age 36  . Diabetes Sister    Social History:  reports that she quit smoking about 6 years ago. She smoked 0.00 packs per day. She has never used smokeless tobacco. She reports that she does not drink alcohol or use drugs.  Allergies:  Allergies  Allergen Reactions  . Procaine Other (See Comments)    Heart race  . Adhesive [Tape] Rash and Other (See Comments)    SKIN IRRITATION    Medications:                                                                                                                        Unable to review given medical condition. Medications as listed on her most recent outpatient visit. Current Outpatient Medications  Medication Instructions  . Blood Glucose Monitoring Suppl (TRUE METRIX AIR GLUCOSE METER) W/DEVICE KIT 1 Device, Other,  Once  . CALCIUM CITRATE-VITAMIN D PO 2 tablets, Oral, Daily  . cholecalciferol (VITAMIN D) 2,000 Units, Oral, Daily  . famotidine (PEPCID) 40 mg, Oral, Daily at bedtime  . insulin NPH Human (NOVOLIN N) 10 Units, Subcutaneous, Daily at bedtime  . insulin regular (NOVOLIN R) 65 Units, Subcutaneous, Daily  . INSULIN SYRINGE 1CC/29G (MONOJECT ULTRA COMFORT SYRINGE) 29G X 1/2" 1 ML MISC USED TO INJECT INSULIN SUBCUTANEOUSLY TWICE DAILY.   Marland Kitchen lisinopril (ZESTRIL) 10 mg, Oral, Daily  . omeprazole (PRILOSEC) 40 mg, Oral, 2 times daily  . rosuvastatin (CRESTOR) 10 MG tablet TAKE 1 TABLET EVERY DAY  . TRUE METRIX BLOOD GLUCOSE TEST test strip TEST TWO TIMES DAILY  . TRUEPLUS LANCETS 28G MISC TEST TWO TIMES DAILY  . venlafaxine XR (EFFEXOR-XR) 150 mg, Oral, Daily with breakfast  .  vitamin E (VITAMIN E) 400 Units, Daily   ROS:                                                                                                                                     Unable to perform due to mental status   Examination:                                                                                                      Vitals:   08/16/2018 1200 07/29/2018 1230  BP: (!) 78/65 (!) 109/53  Pulse: (!) 108 (!) 101  Resp: (!) 25 (!) 25  Temp:  98.6 F (37 C)  SpO2: 100% 100%   General: Intubated, hypotensive, on pressors Psych: Affect appropriate to situation Eyes: No scleral injection HENT: No OP obstrucion Head: Normocephalic  Cardiovascular: Normal rate and regular rhythm Respiratory: Effort normal and breath sounds normal to anterior ascultation GI: Soft.  No distension. There is no tenderness.  Skin: WDI   Neurological Examination: Patient intubated Mental Status: Unable to assess, intubated and unresponsive Cranial Nerves: Unable to asess Motor: Unable to assess Deep Tendon Reflexes: areflexic  Cerebellar: Unable to assess Gait: Unable to assess  Lab Results: Basic Metabolic Panel: Recent Labs  Lab 08/21/2018 1128 08/08/2018 1133 08/11/2018 1243  NA 148* 139 143  K 4.3 7.3* 5.3*  CL 120*  --   --   CO2 7*  --   --   GLUCOSE 579*  --   --   BUN 53*  --   --   CREATININE 2.31*  --   --   CALCIUM 5.5*  --   --    CBC: Recent Labs  Lab 08/02/2018 1128 08/20/2018 1133 08/20/2018 1243  WBC 14.1*  --   --   NEUTROABS 11.6*  --   --   HGB 10.2* 18.4* 12.6  HCT 35.0* 54.0* 37.0  MCV 91.6  --   --   PLT 268  --   --    Coagulation Studies: Recent Labs    08/18/2018 1231  LABPROT 18.6*  INR 1.6*    Imaging: Dg Chest Port 1 View  Result Date: 08/02/2018 CLINICAL DATA:  Respiratory failure EXAM: PORTABLE CHEST 1 VIEW COMPARISON:  02/23/2017 FINDINGS: Endotracheal tube terminates approximately 3.2 cm superior to the carina. Enteric tube courses below the diaphragm, distal tip beyond the inferior margin of the film. Cardiomediastinal silhouette is within normal limits. No focal airspace consolidation, pleural effusion, or pneumothorax. IMPRESSION: Satisfactory  positioning of endotracheal and enteric tubes. No acute findings. Electronically Signed   By: Davina Poke M.D.   On: 08/15/2018 12:45    Kathi Ludwig, MD [facility], PGY-3   EEG  This study showed evidence of epileptogenicity in left fronto-parietal region as well as moderate diffuse encephalopathy. No seizures were seen throughout the recording.  The referring physician was immediately notified.    NEUROHOSPITALIST ADDENDUM Performed a face to face diagnostic evaluation.   I have reviewed the contents of history and physical exam as documented by PA/ARNP/Resident and agree with above documentation.  I have discussed and formulated the above plan as documented. Edits to the note have been made as needed.   I evaluated the patient immediately at time of consultation.  At the time patient was just intubated and exam was limited due to sedation.  I reassessed the patient at 6 PM and patient is on minimal sedation, she had roving eye movements,no forced gaze deviation. Pupils were reactive but sluggish.  No withdrawal in any of all 4 extremities.  Plantars are mute.   ASSESSMENT AND PLAN    Yvonne Reynolds is a 73 y.o. female with a PMHx notable for insulin dependent DMII, HLD, MDD, recurrent falls of recent who presents to the [facility] ED after being found by EMS in her home unresponsive. She was reportedly hypotensive, covered in urine, beside a bottle of Pepcid.  She was noted to have left gaze deviation and right arm and leg twitching per EDP prior to being intubated.  High concern for status epilepticus in the setting of DKA.  CT head showed subacute to chronic infarct of the left frontal lobe.  The patient appears to be in DKA but urine negative for ketones. This may explain her severe dehydration, elevated sCr, and some degree of her hypotension but not the entirety of this.  Patient also noted to be hypotensive and there is concern for infectious etiology precipitating this.  LP was considered however patient did not have meningismus and INR was 1.6.  Stat  EEG: Showed epileptogenicity  in the left frontoparietal region.  Patient was loaded with Keppra 1.5 g. MRI brain stat was also ordered, however patient was hypotensive and was taken to the ICU for optimization and stabilization of her blood pressure and hyperglycemia.  Acute metabolic encephalopathy likely secondary to diabetic ketoacidosis and status epilepticus Status epilepticus Comatose on presentation  Plan: We will obtain an overnight EEG Continue Keppra 750 mg twice daily MRI brain when patient is stabilized If sepsis does not resolve after treatment of DKA and patient becomes febrile, consider lumbar puncture. Continue to treat metabolic conditions   The patient is neurologically critically ill due to presentation and comatose state with concern for status epilepticus.  Patient is a high risk of neurological deterioration due to worsening seizures, stroke, cerebral edema, cardiac and respiratory arrest, complications of intubation.  Care of this patient included assessment, monitoring vital signs, complex treatment decisions, discussion with the critical care and ED teams, obtaining stat EEG and reviewing EEG results, administration AEDs.  I spent 70 minutes of critical care time with this patient.     Karena Addison Aroor MD [facility] [phone]   If 7pm to 7am, please call on call as listed on AMION.

## 2018-08-19 NOTE — ED Triage Notes (Signed)
Pt bib [place] ems after neighbors called for a well check. Pt last seen yesterday with her dogs. Pt found down unresponsive by ems with shallow respirations. BVM en route due to respiratory status. 124/102, CBG 435. IV not established upon arrival to ED. Spontaneous eye opening.

## 2018-08-19 NOTE — H&P (Signed)
PULMONARY / CRITICAL CARE MEDICINE   NAME:  Yvonne Reynolds, MRN:  [id number], DOB:  April 19, 1945, LOS: 0 ADMISSION DATE:  08/16/2018, CONSULTATION DATE:  07/29/2018 REFERRING MD:  ED, Dr. Ralene Bathe, CHIEF COMPLAINT:  AMS   BRIEF HISTORY:    Yvonne Reynolds is a 73 y.o. female who presented to the ED via EMS and was placed on vent on arrival.    HISTORY OF PRESENT ILLNESS   Patient was last known well at 3 PM on 08/18/2018.  Per EMS report, she was unresponsive in a very hot home with a bottle of Pepcid near her.  She was covered in urine.  Neighbor heard her screaming at her dog yesterday at 3 PM.  The following medical history is taken from recent clinic notes. Patient's past medical history is significant for IDDM complicated by retinopathy and nephropathy and has been on insulin since 1999.  Her endocrinology notes at [facility], patient has never been in DKA, though has had a severe hypoglycemic episode in 2017).  Patient's outpatient visit notes have our revealing of her recent medical history.  In May 2020, she was seen by Dr. Loanne Drilling (endocrinologist) where patient had reported that she stopped taking all of her medications due to depression.  He also emergently sent her to Dr. Charlton Amor office as patient reported frequent falls. For her depression, patient takes Effexor 150 mg daily.  She lives by herself and reports that her family has not been visiting her due to [place].  Patient also has hypertension that was previously controlled on medication however, patient has not been compliant with medications. SIGNIFICANT PAST MEDICAL HISTORY   Depression GERD Dyslipidemia Seizure due to hypoglycemia Hypertension, poor recent medication compliance IDDM, complicated by retinopathy and nephropathy on insulin since 1999 Macular degeneration Nocturia Osteoarthritis  SIGNIFICANT EVENTS:  7/23 >> admitted & Intubated   STUDIES:   CXR >> ET tube in place.  No airspace disease. CT Head >> no acute  intracranial abnormality.  Ischemic and atrophic changes noted that are subacute to chronic. EEG >>  CULTURES:  7/23 Blood culture x2 >>  7/23 urine culture >>   ANTIBIOTICS:  Cefepime 2g 7/23 >>  Flagyl 500 mg 7/23 >>  Vancomycin 7/23 >> LINES/TUBES:   Patient Lines/Drains/Airways Status   Active Line/Drains/Airways    Name:   Placement date:   Placement time:   Site:   Days:   Peripheral IV 08/22/2018 Right Hand   08/18/2018    1130    Hand   less than 1   Peripheral IV 08/22/2018 Left Antecubital   08/27/2018    1149    Antecubital   less than 1   Peripheral IV 08/07/2018 Left Forearm   08/20/2018    1149    Forearm   less than 1   Peripheral IV 08/03/2018 Right Forearm   08/14/2018    1204    Forearm   less than 1   Urethral Catheter Camryn Cogan, NT Temperature probe 10 Fr.   07/28/2018    1220    Temperature probe   less than 1   Airway 7 mm   08/26/2018    1155     less than 1   Incision 07/23/12 Ankle Left   07/23/12    2135     2218   Incision (Closed) 10/17/14 Chest Other (Comment)   10/17/14    0825     1402          CONSULTANTS:  Neurology   OBJECTIVE   Temp:  [98.4 F (36.9 C)-98.8 F (37.1 C)] 98.5 F (36.9 C) (07/23 1415) Pulse Rate:  [94-109] 94 (07/23 1501) Resp:  [22-42] 25 (07/23 1501) BP: (78-120)/(39-76) 113/76 (07/23 1501) SpO2:  [96 %-100 %] 100 % (07/23 1501) FiO2 (%):  [50 %-100 %] 50 % (07/23 1501) Weight:  [73 kg] 73 kg (07/23 1100)   Vent Mode: PRVC FiO2 (%):  [100 %] 100 % Set Rate:  [25 bmp] 25 bmp Vt Set:  [380 mL] 380 mL PEEP:  [5 cmH20] 5 cmH20 Plateau Pressure:  [15 cmH20] 15 cmH20  PHYSICAL EXAM: General: Elderly white female, lying in bed sedated.  Well-groomed. HEENT: Pupils are 25m.  Patient's eyes move horizontally from left to right rhythmically. Cardiovascular: Regular rate and rhythm.  Third heart sound appreciated, possibly S4.  No lower extremity edema. Lungs: Clear to auscultation anteriorly.  Patient on ventilation. Abdomen: Soft,  nondistended.  Positive bowel sounds Musculoskeletal: Muscle bulk appropriate for patient's age. Skin: Soft, pink, warm. Extremities: Right tibial IO access   ASSESSMENT AND PLAN   AMS  Patient found down at home.  Last known well at 3 PM yesterday.  Patient has poorly controlled hypertension, poorly controlled diabetes, hyperlipidemia, female and is at high risk for cardiovascular/neurologic events; however, CT head is negative for any acute infarcts.  She does have chronic atrophic and ischemic changes, especially in left MCA. Patient with recent history of falls, on physical exam, there is no obvious trauma or bruising and no contusions on headand imaging negative. Should also consider infection, with small bacteuria on UA today, though patient does not have fever or white count.  Currently treating empirically with Vanco, cefepime, Flagyl.  Acidosis is likely associated with hyperglycemia, DKA versus HHS, especially in this patient known to have medication noncompliance.  Lab findings more consistent with HHS with impairment in cognitive function, serum osmolality, and negative urine ketones. BHB acid elevated at 7.94.  . Admit to ICU  . Phase 1 - HHS Protocol   BMP every 4 hours   Na should rise with declining P osm   Do not reduce Na > 10 in 24 hours. If Na remains persistently high in pts who are no longer hypvolemic and have appropriately declining glucose, consider changing to 1/2NS  Fluid Resuscitation:   S/p 4 L NS in ED. 1L LR on ICU,  Then, continue fluid resuscitation with LR125 mL/hr until CBG is <300  When CBG <300, follow with D5LR at 125 ml/hr.   Continue to monitor potassium, replete PRN IV KCl runs  If K <5.2, 20 mEq/hr. If K >5.2, HOLD KCl  If K <3.2 - HOLD insulin   Adjust insulin gtt for CBGs 200-300  Repeat BHB in AM.   Narrow antibiotics when possible  F/u blood culture   Consult Diabetes Coordinator   Will need PT/OT assessment given recent falls  when extubated prior to discharge   Acute Respiratory Failure Intubated on arrival to ED. No chronic respiratory problems in history.  Likely due to acidosis as chest x-ray was negative for consolidation, effusion, pneumothorax.  We will continue ventilation and wean support as needed.  Sedation with propofol and fentanyl.  If patient's respiratory failure due to acidosis from hyperglycemia, can manage pain with fentanyl as needed and sedation with propofol or Precedex.    O2 Sats > 92%  Wean as possible   Pulmonary hygiene   PAD  Bacteruria UA with few yeast and bacteria,  the patient is unable to tell us whether she is symptomatic.  Will give patient Diflucan x1 via tube and follow-up urine culture and blood culture.  Empiric antibiotic started in the ED, vancomycin, cefepime, Flagyl.  AM CBC w/ diff   Narrow abx when possible  F/u urine and bld cx  Seizure activity  On physical exam, patient with right-sided posturing and left to right horizontal eye movement that is rhythmic.  Neurology was consulted in the ED and obtained EEG.  That has not been read yet.  Was going to perform LP, however patient's INR came back at 1.6.  Will reconsider in the future.  There is note of patient having seizure in the past due to hyperglycemia.  Appreciate neurology recommendations  Follow-up EEG  HTN Per progress notes at [facility], patient's blood pressure is not well controlled.  She is noncompliant with her hypertension medications.  At home, patient taking lisinopril 10 mg daily.  Permissive hypertension until CT imaging returns.  If negative for intracranial pathology, can manage hypertension with IV labetalol as needed.    IDDM At home, patient is on 65 units of Novolin daily and 10 units of NPH at bedtime.  Patient has history of poor medication compliance.  Per progress notes, financial issues prevent patient from obtaining diabetic medications that would be easier for this patient to  manage.  Diabetes coordinator  Management as above  Depression Patient on venlafaxine XR 150 mg daily with breakfast.  Continue venlafaxine per tube  HS Troponin  Elevated at 56-63.  Likely demand ischemia given patient's current status.  No consult to cardiology at this time. Best Practice / Goals of Care / Disposition.   DVT PROPHYLAXIS:Low dose lovenox  SUP: Yes NUTRITION: NPO, nutrition consulted  MOBILITY: BR GOALS OF CARE: not  FAMILY DISCUSSIONS: Vernia Buff [phone] DISPOSITION ICU   LABS  Glucose Recent Labs  Lab 08/10/2018 1137  GLUCAP >600*   BMET Recent Labs  Lab 08/11/2018 1128 08/06/2018 1133 08/24/2018 1243  NA 148* 139 143  K 4.3 7.3* 5.3*  CL 120*  --   --   CO2 7*  --   --   BUN 53*  --   --   CREATININE 2.31*  --   --   GLUCOSE 579*  --   --    Liver Enzymes Recent Labs  Lab 08/15/2018 1128  AST 64*  ALT 37  ALKPHOS 72  BILITOT 1.3*  ALBUMIN 2.2*   Electrolytes Recent Labs  Lab 08/24/2018 1128  CALCIUM 5.5*   CBC Recent Labs  Lab 08/22/2018 1128 08/14/2018 1133 08/01/2018 1243  WBC 14.1*  --   --   HGB 10.2* 18.4* 12.6  HCT 35.0* 54.0* 37.0  PLT 268  --   --    ABG Recent Labs  Lab 07/28/2018 1243  PHART 7.184*  PCO2ART 35.2  PO2ART 560.0*   Coag's Recent Labs  Lab 08/04/2018 1231  APTT 27  INR 1.6*   Sepsis Markers Recent Labs  Lab 07/29/2018 1129  LATICACIDVEN 7.5*   Cardiac Enzymes HS troponin - 63   PAST MEDICAL HISTORY :   She  has a past medical history of Complication of anesthesia, Depression, GERD (gastroesophageal reflux disease), High cholesterol, History of seizure, Hypertension, Insulin dependent diabetes mellitus ([place]), Macular degeneration, Nocturia, Osteoarthritis, Osteoporosis, Popping of temporomandibular joint on opening of jaw, Sebaceous cyst (09/2014), and Toenail fungus.  PAST SURGICAL HISTORY:  She  has a past surgical history that includes Abdominal hysterectomy;  Esophagogastroduodenoscopy  (05/07/2004); Cesarean section; ORIF wrist fracture (Left); ORIF ankle fracture (Left, 07/23/2012); Esophagogastroduodenoscopy (egd) with propofol (07/06/2013); Cataract extraction w/ intraocular lens  implant, bilateral (Bilateral); Bladder suspension; Mass excision (N/A, 10/17/2014); Cardiac catheterization (N/A, 04/12/2015); Cardiac catheterization (Right, 04/12/2015); lower extremity angiogram (Bilateral, 04/12/2015); ABDOMINAL AORTOGRAM W/LOWER EXTREMITY (Bilateral, 10/30/2017); and PERIPHERAL VASCULAR BALLOON ANGIOPLASTY (10/30/2017).  Allergies  Allergen Reactions  . Procaine Other (See Comments)    Heart race  . Adhesive [Tape] Rash and Other (See Comments)    SKIN IRRITATION    No current facility-administered medications on file prior to encounter.    Current Outpatient Medications on File Prior to Encounter  Medication Sig  . Blood Glucose Monitoring Suppl (TRUE METRIX AIR GLUCOSE METER) W/DEVICE KIT 1 Device by Other route once.  Marland Kitchen CALCIUM CITRATE-VITAMIN D PO Take 2 tablets by mouth daily.   . cholecalciferol (VITAMIN D) 1000 units tablet Take 2,000 Units by mouth daily.   . famotidine (PEPCID) 40 MG tablet Take 1 tablet (40 mg total) by mouth at bedtime.  . insulin NPH Human (NOVOLIN N) 100 UNIT/ML injection Inject 0.1 mLs (10 Units total) into the skin at bedtime.  . insulin regular (NOVOLIN R,HUMULIN R) 100 units/mL injection Inject 0.65 mLs (65 Units total) into the skin daily.  . INSULIN SYRINGE 1CC/29G (MONOJECT ULTRA COMFORT SYRINGE) 29G X 1/2" 1 ML MISC USED TO INJECT INSULIN SUBCUTANEOUSLY TWICE DAILY.  Marland Kitchen lisinopril (ZESTRIL) 10 MG tablet Take 1 tablet (10 mg total) by mouth daily.  Marland Kitchen omeprazole (PRILOSEC) 40 MG capsule TAKE 1 CAPSULE (40 MG TOTAL) BY MOUTH 2 (TWO) TIMES DAILY.  . rosuvastatin (CRESTOR) 10 MG tablet TAKE 1 TABLET EVERY DAY (Patient taking differently: Take 10 mg by mouth daily. )  . TRUE METRIX BLOOD GLUCOSE TEST test strip TEST TWO TIMES DAILY  . TRUEPLUS  LANCETS 28G MISC TEST TWO TIMES DAILY  . venlafaxine XR (EFFEXOR-XR) 150 MG 24 hr capsule TAKE 1 CAPSULE (150 MG TOTAL) BY MOUTH DAILY WITH BREAKFAST.  Marland Kitchen vitamin E (VITAMIN E) 400 UNIT capsule Take 400 Units by mouth daily.    FAMILY HISTORY:   Her family history includes Diabetes in her brother, sister, and sister; Heart disease in her brother and sister; Ovarian cancer in her mother; Stroke in her father and sister.  SOCIAL HISTORY:  She  reports that she quit smoking about 6 years ago. She smoked 0.00 packs per day. She has never used smokeless tobacco. She reports that she does not drink alcohol or use drugs. Patient lives by herself at home.  Her family members have not been visiting her recently due to [place].  She lives with 2 dogs.  Her nephew lives about 5 minutes away from her. REVIEW OF SYSTEMS:    Unable to obtain ROS as patient is sedated.

## 2018-08-19 NOTE — Progress Notes (Signed)
Pt came by EMS being bagged. RT took over bagging, pt was placed on ventilator at 1155 by ED physician. Pt on VT 380 (8cc), RR 25, PEEP 5, and FIO2 100%. Will continue to monitor.

## 2018-08-19 NOTE — Progress Notes (Signed)
STAT EEG complete - results pending. ? ?

## 2018-08-19 NOTE — ED Provider Notes (Addendum)
[facility] EMERGENCY DEPARTMENT Provider Note   CSN: [id number] Arrival date & time: 08/18/2018  1126    History   Chief Complaint No chief complaint on file.   HPI Yvonne Reynolds is a 73 y.o. female.     The history is provided by the EMS personnel and medical records. No language interpreter was used.   Yvonne Reynolds is a 73 y.o. female who presents to the Emergency Department complaining of AMS.  Level V caveat due to unresponsiveness. History is provided by EMS. EMS reports that she was found unresponsive in a very hot home with a bottle of Pepcid near her. She was covered in urine. A neighbor heard her screaming at her dog at 3 PM yesterday. She has a history of diabetes.  Past Medical History:  Diagnosis Date   Complication of anesthesia    states was hard to wake up after 1st EGD   Depression    GERD (gastroesophageal reflux disease)    High cholesterol    History of seizure    x 1 - due to hyperglycemia   Hypertension    states under control with med., has been on med. > 20 yr.   Insulin dependent diabetes mellitus (HCC)    Macular degeneration    Nocturia    Osteoarthritis    shoulder   Osteoporosis    Popping of temporomandibular joint on opening of jaw    Sebaceous cyst 09/2014   chest wall - sternum   Toenail fungus     Patient Active Problem List   Diagnosis Date Noted   Altered mental state 08/03/2018   Contusion, chest wall 02/23/2017   Contusion of right hand 02/23/2017   Contusion of right forearm 02/23/2017   Trigger finger 09/26/2016   Acne 09/26/2016   Skin nodule 02/21/2016   Pustule 10/16/2015   Diabetes ([place]) 10/15/2015   PAD (peripheral artery disease) ([place]) 04/10/2015   Foot ulcer ([place]) 03/05/2015   Sleep disorder 10/12/2014   Abscess of chest wall 09/14/2014   Special screening for malignant neoplasms, colon 08/17/2014   Screening examination for infectious disease 02/15/2014    Anemia, iron deficiency 11/15/2013   Hyperkalemia 08/16/2013   Nausea and vomiting 09/18/2012   Displaced trimalleolar fracture of left lower leg 07/23/2012    Class: Acute   Encounter for long-term (current) use of other medications 06/29/2012   Pain in joint, shoulder region 06/29/2012   Zoster 06/07/2011   Abdominal pain 06/03/2011   PROTEINURIA, MILD 12/18/2008   BENIGN NEOPLASM OF DUODENUM JEJUNUM AND ILEUM 12/22/2006   Hyperlipidemia associated with type 2 diabetes mellitus ([place]) 12/22/2006   OBSTRUCTIVE SLEEP APNEA 12/22/2006   ALLERGIC RHINITIS 12/22/2006   REFLUX ESOPHAGITIS 12/22/2006   BARRETTS ESOPHAGUS 12/22/2006   HIATAL HERNIA 12/22/2006   BACK PAIN 12/21/2006   Depression 06/26/2006   HYPERTENSION 06/26/2006   GERD 06/26/2006   OSTEOARTHRITIS 06/26/2006   Osteoporosis 06/26/2006    Past Surgical History:  Procedure Laterality Date   ABDOMINAL AORTOGRAM W/LOWER EXTREMITY Bilateral 10/30/2017   Procedure: ABDOMINAL AORTOGRAM W/LOWER EXTREMITY;  Surgeon: Elam Dutch, MD;  Location: [facility];  Service: Cardiovascular;  Laterality: Bilateral;   ABDOMINAL HYSTERECTOMY     partial   BLADDER SUSPENSION     CATARACT EXTRACTION W/ INTRAOCULAR LENS  IMPLANT, BILATERAL Bilateral    CESAREAN SECTION     ESOPHAGOGASTRODUODENOSCOPY  05/07/2004   ESOPHAGOGASTRODUODENOSCOPY (EGD) WITH PROPOFOL  07/06/2013   LOWER EXTREMITY ANGIOGRAM Bilateral 04/12/2015  Procedure: Lower Extremity Angiogram;  Surgeon: Conrad Point Isabel, MD;  Location: [facility];  Service: Cardiovascular;  Laterality: Bilateral;   MASS EXCISION N/A 10/17/2014   Procedure: EXCISION 2X3CM SUBCUTANEOUS CHEST WALL CYST;  Surgeon: Rolm Bookbinder, MD;  Location: [facility];  Service: General;  Laterality: N/A;   ORIF ANKLE FRACTURE Left 07/23/2012   Procedure: OPEN REDUCTION INTERNAL FIXATION (ORIF) ANKLE FRACTURE;  Surgeon: Jessy Oto, MD;   Location: [facility];  Service: Orthopedics;  Laterality: Left;   ORIF WRIST FRACTURE Left    PERIPHERAL VASCULAR BALLOON ANGIOPLASTY  10/30/2017   Procedure: PERIPHERAL VASCULAR BALLOON ANGIOPLASTY;  Surgeon: Elam Dutch, MD;  Location: [facility];  Service: Cardiovascular;;  rt sfa   PERIPHERAL VASCULAR CATHETERIZATION N/A 04/12/2015   Procedure: Abdominal Aortogram;  Surgeon: Conrad Davie, MD;  Location: [facility];  Service: Cardiovascular;  Laterality: N/A;   PERIPHERAL VASCULAR CATHETERIZATION Right 04/12/2015   Procedure: Peripheral Vascular Balloon Angioplasty;  Surgeon: Conrad Redding, MD;  Location: [facility];  Service: Cardiovascular;  Laterality: Right;  rt sfa drug coated balloon     OB History   No obstetric history on file.      Home Medications    Prior to Admission medications   Medication Sig Start Date End Date Taking? Authorizing Provider  famotidine (PEPCID) 40 MG tablet Take 1 tablet (40 mg total) by mouth at bedtime. 05/10/18  Yes Ladene Artist, MD  insulin NPH Human (NOVOLIN N) 100 UNIT/ML injection Inject 0.1 mLs (10 Units total) into the skin at bedtime. 03/03/18  Yes Renato Shin, MD  insulin regular (NOVOLIN R,HUMULIN R) 100 units/mL injection Inject 0.65 mLs (65 Units total) into the skin daily. 03/03/18  Yes Renato Shin, MD  omeprazole (PRILOSEC) 40 MG capsule TAKE 1 CAPSULE (40 MG TOTAL) BY MOUTH 2 (TWO) TIMES DAILY. 05/27/18  Yes Ladene Artist, MD  venlafaxine XR (EFFEXOR-XR) 150 MG 24 hr capsule TAKE 1 CAPSULE (150 MG TOTAL) BY MOUTH DAILY WITH BREAKFAST. 09/10/17  Yes Renato Shin, MD  Blood Glucose Monitoring Suppl (TRUE METRIX AIR GLUCOSE METER) W/DEVICE KIT 1 Device by Other route once. 09/12/14   Renato Shin, MD  CALCIUM CITRATE-VITAMIN D PO Take 2 tablets by mouth daily.     [provider]  cholecalciferol (VITAMIN D) 1000 units tablet Take 2,000 Units by mouth daily.     [provider]  INSULIN  SYRINGE 1CC/29G (MONOJECT ULTRA COMFORT SYRINGE) 29G X 1/2" 1 ML MISC USED TO INJECT INSULIN SUBCUTANEOUSLY TWICE DAILY. 05/19/17   Renato Shin, MD  lisinopril (ZESTRIL) 10 MG tablet Take 1 tablet (10 mg total) by mouth daily. 06/03/18   Renato Shin, MD  rosuvastatin (CRESTOR) 10 MG tablet TAKE 1 TABLET EVERY DAY Patient taking differently: Take 10 mg by mouth daily.  09/10/17   Renato Shin, MD  TRUE METRIX BLOOD GLUCOSE TEST test strip TEST TWO TIMES DAILY 08/04/17   Renato Shin, MD  TRUEPLUS LANCETS 28G MISC TEST TWO TIMES DAILY 08/04/17   Renato Shin, MD  vitamin E (VITAMIN E) 400 UNIT capsule Take 400 Units by mouth daily.    [provider]    Family History Family History  Problem Relation Age of Onset   Ovarian cancer Mother    Stroke Father    Diabetes Sister    Stroke Sister    Heart disease Sister        before age 72  Diabetes Brother    Heart disease Brother        before age 24   Diabetes Sister     Social History Social History   Tobacco Use   Smoking status: Former Smoker    Packs/day: 0.00    Quit date: 11/11/2011    Years since quitting: 6.7   Smokeless tobacco: Never Used  Substance Use Topics   Alcohol use: No   Drug use: No     Allergies   Procaine and Adhesive [tape]   Review of Systems Review of Systems  All other systems reviewed and are negative.    Physical Exam Updated Vital Signs BP (!) 101/55    Pulse 98    Temp 98.7 F (37.1 C)    Resp (!) 27    Ht _0  (1.549 m)    Wt 73 kg    SpO2 100%    BMI 30.41 kg/m   Physical Exam Vitals signs and nursing note reviewed.  Constitutional:      General: She is in acute distress.     Appearance: She is well-developed. She is ill-appearing.  HENT:     Head: Normocephalic.     Comments: Abrasion to nasal bridge Cardiovascular:     Rate and Rhythm: Regular rhythm. Tachycardia present.  Pulmonary:     Comments: Tachypnea with shallow respirations, course breath  sounds bilaterally Abdominal:     Palpations: Abdomen is soft.     Tenderness: There is no abdominal tenderness. There is no guarding or rebound.  Musculoskeletal:        General: No tenderness.     Comments: Faint femoral pulses bilaterally. IO in the right anterior tibia  Skin:    General: Skin is warm.     Comments: Skin is sticky to touch  Neurological:     Comments: Unresponsive. Eyes are open spontaneously with roving eye movements to the left. No corneal reflex. Does not gaze entirely to the right. No spontaneous movement. Does not withdrawal to painful stimuli. There is a mild movement to the right arm and bilateral feet at times that is very brief.  Psychiatric:     Comments: Unable to assess      ED Treatments / Results  Labs (all labs ordered are listed, but only abnormal results are displayed) Labs Reviewed  CBC WITH DIFFERENTIAL/PLATELET - Abnormal; Notable for the following components:      Result Value   WBC 14.1 (*)    RBC 3.82 (*)    Hemoglobin 10.2 (*)    HCT 35.0 (*)    MCHC 29.1 (*)    Neutro Abs 11.6 (*)    Monocytes Absolute 1.1 (*)    Abs Immature Granulocytes 0.52 (*)    All other components within normal limits  COMPREHENSIVE METABOLIC PANEL - Abnormal; Notable for the following components:   Sodium 148 (*)    Chloride 120 (*)    CO2 7 (*)    Glucose, Bld 579 (*)    BUN 53 (*)    Creatinine, Ser 2.31 (*)    Calcium 5.5 (*)    Total Protein 4.7 (*)    Albumin 2.2 (*)    AST 64 (*)    Total Bilirubin 1.3 (*)    GFR calc non Af Amer 21 (*)    GFR calc Af Amer 24 (*)    Anion gap 21 (*)    All other components within normal limits  ACETAMINOPHEN LEVEL - Abnormal; Notable for  the following components:   Acetaminophen (Tylenol), Serum <10 (*)    All other components within normal limits  LACTIC ACID, PLASMA - Abnormal; Notable for the following components:   Lactic Acid, Venous 7.5 (*)    All other components within normal limits  LACTIC ACID,  PLASMA - Abnormal; Notable for the following components:   Lactic Acid, Venous 5.0 (*)    All other components within normal limits  URINALYSIS, ROUTINE W REFLEX MICROSCOPIC - Abnormal; Notable for the following components:   APPearance TURBID (*)    Glucose, UA 250 (*)    Hgb urine dipstick LARGE (*)    Protein, ur >300 (*)    Leukocytes,Ua LARGE (*)    All other components within normal limits  PROTIME-INR - Abnormal; Notable for the following components:   Prothrombin Time 18.6 (*)    INR 1.6 (*)    All other components within normal limits  URINALYSIS, MICROSCOPIC (REFLEX) - Abnormal; Notable for the following components:   Bacteria, UA FEW (*)    All other components within normal limits  BETA-HYDROXYBUTYRIC ACID - Abnormal; Notable for the following components:   Beta-Hydroxybutyric Acid 7.94 (*)    All other components within normal limits  POCT I-STAT EG7 - Abnormal; Notable for the following components:   pH, Ven 7.105 (*)    pCO2, Ven 37.0 (*)    pO2, Ven 68.0 (*)    Bicarbonate 11.6 (*)    TCO2 13 (*)    Acid-base deficit 17.0 (*)    Potassium 7.3 (*)    Calcium, Ion 1.02 (*)    HCT 54.0 (*)    Hemoglobin 18.4 (*)    All other components within normal limits  CBG MONITORING, ED - Abnormal; Notable for the following components:   Glucose-Capillary >600 (*)    All other components within normal limits  POCT I-STAT 7, (LYTES, BLD GAS, ICA,H+H) - Abnormal; Notable for the following components:   pH, Arterial 7.184 (*)    pO2, Arterial 560.0 (*)    Bicarbonate 13.2 (*)    TCO2 14 (*)    Acid-base deficit 14.0 (*)    Potassium 5.3 (*)    Calcium, Ion 1.03 (*)    All other components within normal limits  TROPONIN I (HIGH SENSITIVITY) - Abnormal; Notable for the following components:   Troponin I (High Sensitivity) 56 (*)    All other components within normal limits  TROPONIN I (HIGH SENSITIVITY) - Abnormal; Notable for the following components:   Troponin I (High  Sensitivity) 63 (*)    All other components within normal limits  SARS CORONAVIRUS 2 (HOSPITAL ORDER, [facility])  URINE CULTURE  CULTURE, BLOOD (ROUTINE X 2)  CULTURE, BLOOD (ROUTINE X 2)  ETHANOL  SALICYLATE LEVEL  CK  APTT  CBC  BASIC METABOLIC PANEL  BASIC METABOLIC PANEL  BLOOD GAS, ARTERIAL  LACTIC ACID, PLASMA  LACTIC ACID, PLASMA    EKG EKG Interpretation  Date/Time:  Thursday August 19 2018 11:34:20 EDT Ventricular Rate:  110 PR Interval:    QRS Duration: 159 QT Interval:  366 QTC Calculation: 496 R Axis:   71 Text Interpretation:  Sinus tachycardia Probable left atrial enlargement Right bundle branch block LVH with secondary repolarization abnormality Borderline prolonged QT interval Confirmed by Quintella Reichert [phone]) on 08/15/2018 2:31:10 PM   Radiology Ct Head Wo Contrast  Result Date: 08/11/2018 CLINICAL DATA:  Altered mental status EXAM: CT HEAD WITHOUT CONTRAST CT CERVICAL SPINE  WITHOUT CONTRAST TECHNIQUE: Multidetector CT imaging of the head and cervical spine was performed following the standard protocol without intravenous contrast. Multiplanar CT image reconstructions of the cervical spine were also generated. COMPARISON:  06/18/2009 FINDINGS: CT HEAD FINDINGS Brain: Mild atrophic changes are noted. Chronic white matter ischemic changes are seen. There is a focal area of decreased attenuation in the distribution of the left MCA without significant mass effect likely representing subacute to chronic ischemia. No findings to suggest acute hemorrhage or space-occupying mass lesion are noted. Vascular: No hyperdense vessel or unexpected calcification. Skull: Normal. Negative for fracture or focal lesion. Sinuses/Orbits: No acute finding. Other: None. CT CERVICAL SPINE FINDINGS Alignment: Within normal limits. Skull base and vertebrae: 7 cervical segments are well visualized. Vertebral body height is well maintained. Multilevel osteophytic  changes and facet hypertrophic changes are noted left greater than right. No acute fracture or acute facet abnormality is seen. The odontoid is within normal limits. Soft tissues and spinal canal: Surrounding soft tissue structures are within normal limits. Endotracheal tube and gastric catheter are seen. Upper chest: Visualized lung apices are within normal limits. Other: None IMPRESSION: CT of the head: Chronic atrophic and ischemic changes are noted. Changes in the distribution of the left MCA consistent with subacute to chronic ischemia with mild cephalization. No acute infarct is noted. CT of cervical spine: Multilevel degenerative changes without acute abnormality. Electronically Signed   By: Inez Catalina M.D.   On: 07/30/2018 16:11   Ct Cervical Spine Wo Contrast  Result Date: 08/05/2018 CLINICAL DATA:  Altered mental status EXAM: CT HEAD WITHOUT CONTRAST CT CERVICAL SPINE WITHOUT CONTRAST TECHNIQUE: Multidetector CT imaging of the head and cervical spine was performed following the standard protocol without intravenous contrast. Multiplanar CT image reconstructions of the cervical spine were also generated. COMPARISON:  06/18/2009 FINDINGS: CT HEAD FINDINGS Brain: Mild atrophic changes are noted. Chronic white matter ischemic changes are seen. There is a focal area of decreased attenuation in the distribution of the left MCA without significant mass effect likely representing subacute to chronic ischemia. No findings to suggest acute hemorrhage or space-occupying mass lesion are noted. Vascular: No hyperdense vessel or unexpected calcification. Skull: Normal. Negative for fracture or focal lesion. Sinuses/Orbits: No acute finding. Other: None. CT CERVICAL SPINE FINDINGS Alignment: Within normal limits. Skull base and vertebrae: 7 cervical segments are well visualized. Vertebral body height is well maintained. Multilevel osteophytic changes and facet hypertrophic changes are noted left greater than right.  No acute fracture or acute facet abnormality is seen. The odontoid is within normal limits. Soft tissues and spinal canal: Surrounding soft tissue structures are within normal limits. Endotracheal tube and gastric catheter are seen. Upper chest: Visualized lung apices are within normal limits. Other: None IMPRESSION: CT of the head: Chronic atrophic and ischemic changes are noted. Changes in the distribution of the left MCA consistent with subacute to chronic ischemia with mild cephalization. No acute infarct is noted. CT of cervical spine: Multilevel degenerative changes without acute abnormality. Electronically Signed   By: Inez Catalina M.D.   On: 08/15/2018 16:11   Dg Chest Port 1 View  Result Date: 07/31/2018 CLINICAL DATA:  Respiratory failure EXAM: PORTABLE CHEST 1 VIEW COMPARISON:  02/23/2017 FINDINGS: Endotracheal tube terminates approximately 3.2 cm superior to the carina. Enteric tube courses below the diaphragm, distal tip beyond the inferior margin of the film. Cardiomediastinal silhouette is within normal limits. No focal airspace consolidation, pleural effusion, or pneumothorax. IMPRESSION: Satisfactory positioning of endotracheal and enteric  tubes. No acute findings. Electronically Signed   By: Davina Poke M.D.   On: 08/21/2018 12:45    Procedures .Critical Care Performed by: Quintella Reichert, MD Authorized by: Quintella Reichert, MD   Critical care provider statement:    Critical care time (minutes):  45   Critical care time was exclusive of:  Separately billable procedures and treating other patients   Critical care was necessary to treat or prevent imminent or life-threatening deterioration of the following conditions:  Circulatory failure, CNS failure or compromise, dehydration and shock   Critical care was time spent personally by me on the following activities:  Discussions with consultants, evaluation of patient's response to treatment, examination of patient, ordering and  performing treatments and interventions, ordering and review of laboratory studies, ordering and review of radiographic studies, pulse oximetry, re-evaluation of patient's condition, obtaining history from patient or surrogate, review of old charts, development of treatment plan with patient or surrogate and ventilator management Procedure Name: Intubation Date/Time: 08/20/2018 12:11 PM Performed by: Quintella Reichert, MD Pre-anesthesia Checklist: Patient identified, Emergency Drugs available, Patient being monitored and Suction available Oxygen Delivery Method: Ambu bag Preoxygenation: Pre-oxygenation with 100% oxygen Induction Type: Rapid sequence Ventilation: Mask ventilation without difficulty Laryngoscope Size: Glidescope and 3 Tube size: 7.0 mm Number of attempts: 1 Placement Confirmation: ETT inserted through vocal cords under direct vision Secured at: 22 cm Tube secured with: ETT holder      (including critical care time)  Medications Ordered in ED Medications  LORazepam (ATIVAN) 2 MG/ML injection (has no administration in time range)  propofol (DIPRIVAN) 1000 MG/100ML infusion (5 mcg/kg/min  73 kg Intravenous New Bag/Given 08/24/2018 1245)  norepinephrine (LEVOPHED) 73m in 2582mpremix infusion (0 mcg/min Intravenous Paused 07/30/2018 1219)  vancomycin (VANCOCIN) 1,500 mg in sodium chloride 0.9 % 500 mL IVPB (has no administration in time range)  vancomycin (VANCOCIN) IVPB 1000 mg/200 mL premix (has no administration in time range)  ceFEPIme (MAXIPIME) 2 g in sodium chloride 0.9 % 100 mL IVPB (has no administration in time range)  enoxaparin (LOVENOX) injection 30 mg (has no administration in time range)  pantoprazole (PROTONIX) injection 40 mg (has no administration in time range)  acetaminophen (TYLENOL) tablet 650 mg (has no administration in time range)  lactated ringers infusion (has no administration in time range)  0.9 %  sodium chloride infusion (has no administration in  time range)  insulin regular, human (MYXREDLIN) 100 units/ 100 mL infusion (has no administration in time range)  levETIRAcetam (KEPPRA) tablet 1,500 mg (has no administration in time range)  levETIRAcetam (KEPPRA) 750 mg in sodium chloride 0.9 % 100 mL IVPB (has no administration in time range)  venlafaxine XR (EFFEXOR-XR) 24 hr capsule 150 mg (has no administration in time range)  fluconazole (DIFLUCAN) 40 MG/ML suspension 152 mg (has no administration in time range)  sodium chloride 0.9 % bolus 1,000 mL (1,000 mLs Intravenous New Bag/Given 08/07/2018 1133)  LORazepam (ATIVAN) injection 1 mg (1 mg Intravenous Given 08/22/2018 1145)  levETIRAcetam (KEPPRA) IVPB 1000 mg/100 mL premix (0 mg Intravenous Stopped 08/26/2018 1215)  sodium chloride 0.9 % bolus 2,190 mL (0 mL/kg  73 kg Intravenous Stopped 08/03/2018 1249)  etomidate (AMIDATE) injection (10 mg Intravenous Given 08/20/2018 1153)  rocuronium (ZEMURON) injection (70 mg Intravenous Given 08/27/2018 1154)  fentaNYL (SUBLIMAZE) injection 50 mcg (50 mcg Intravenous Given 08/10/2018 1248)  ceFEPIme (MAXIPIME) 2 g in sodium chloride 0.9 % 100 mL IVPB (0 g Intravenous Stopped 08/21/2018 1318)  metroNIDAZOLE (FLAGYL) IVPB  500 mg (0 mg Intravenous Stopped 08/01/2018 1510)  calcium gluconate 1 g/ 50 mL sodium chloride IVPB (1,000 mg Intravenous New Bag/Given 08/06/2018 1514)  sodium chloride 0.9 % bolus 1,000 mL (1,000 mLs Intravenous New Bag/Given 08/27/2018 1510)     Initial Impression / Assessment and Plan / ED Course  I have reviewed the triage vital signs and the nursing notes.  Pertinent labs & imaging results that were available during my care of the patient were reviewed by me and considered in my medical decision making (see chart for details).        Patient presents to the emergency department for altered mental status and unresponsiveness. Patient unresponsive on ED arrival with tachypnea. Exam is concerning for possible seizure activity. She was treated with  Ativan, Keppra and IV fluid resuscitation. She was intubated for airway protection and respiratory support. Rocuronium was used due to concern for possible hyperkalemia. She was also hypotensive despite IV fluid resuscitation and she was started on lever fed for blood pressure support. She was treated with empiric antibiotics for possible sepsis. Neurology and critical care consulted for further management. Discussed with patient's grandson critical nature of illness.  Final Clinical Impressions(s) / ED Diagnoses   Final diagnoses:  Glasgow coma scale total score 3-8, in the field (EMT or ambulance) ([place])  AKI (acute kidney injury) ([place])  Diabetic ketoacidosis with coma associated with type 2 diabetes mellitus ([place])  Shock circulatory [facility])    ED Discharge Orders    None       Quintella Reichert, MD 08/11/2018 1636    Quintella Reichert, MD 08/15/2018 [phone]

## 2018-08-19 NOTE — Progress Notes (Signed)
Electrographic sz at 22:40, will add vimpat 200mg  IV load, followed by 100mg  IBD.   Roland Rack, MD [facility] [phone]  If 7pm- 7am, please page neurology on call as listed in [place].

## 2018-08-19 NOTE — Procedures (Addendum)
Patient Name: Yvonne Reynolds  MRN: [id number]  Epilepsy Attending: Lora Havens  Date: 08/09/2018  Duration: 36.49mins  Patient history: 72yo female with prior left frontal stroke who presented with ams and right arm posturing concerning for sz. EEG ordered to evaluate.   Level of alertness: sedated  AEDs during EEG study: None  Technical aspects: This EEG study was done with scalp electrodes positioned according to the 10-20 International system of electrode placement. Electrical activity was acquired at a sampling rate of 500Hz  and reviewed with a high frequency filter of 70Hz  and a low frequency filter of 1Hz . EEG data were recorded continuously and digitally stored.   BACKGROUND ACTIVITY:   Slowing: Continuous generalized theta-delta slowing   EPILEPTIFORM ACTIVITY: Interictal epileptiform activity: Frequent spikes in left frontoparietal region.   Ictal Activity: None  OTHER EVENTS: None  ACTIVATION PROCEDURES:  Hyperventilation and photic stimulation were not performed.  IMPRESSION: This study showed evidence of epileptogenicity in left fronto-parietal region as well as moderate diffuse encephalopathy. No seizures were seen throughout the recording.  The referring physician was immediately notified.   Tinslee Klare Barbra Sarks

## 2018-08-19 NOTE — Procedures (Signed)
Central Venous Catheter Insertion Procedure Note MELYNDA KRZYWICKI [id number] 22-Jan-1946  Procedure: Insertion of Central Venous Catheter Indications: Drug and/or fluid administration and Frequent blood sampling  Procedure Details Consent: Unable to obtain consent because of emergent medical necessity. Time Out: Verified patient identification, verified procedure, site/side was marked, verified correct patient position, special equipment/implants available, medications/allergies/relevent history reviewed, required imaging and test results available.  Performed  Maximum sterile technique was used including antiseptics, cap, gloves, gown, hand hygiene, mask and sheet. Skin prep: Chlorhexidine; local anesthetic administered A antimicrobial bonded/coated triple lumen catheter was placed in the right internal jugular vein using the Seldinger technique under real time ultrasound guidance.   Evaluation Blood flow good Complications: No apparent complications Patient did tolerate procedure well. Chest X-ray ordered to verify placement.  CXR: shows catheter in good position. No pneumothorax. Alfonzo Feller 08/15/2018, 6:27 PM

## 2018-08-19 NOTE — Progress Notes (Addendum)
Continuous EEG has started, no ongoing seizure activity. Burst suppression pattern in the setting of propofol.   Roland Rack, MD [facility] [phone]  If 7pm- 7am, please page neurology on call as listed in [place].

## 2018-08-19 NOTE — Progress Notes (Signed)
LTM EEG initiated, no skin breakdown noted. Pt event button tested, RN educated regarding LTM.

## 2018-08-19 NOTE — Plan of Care (Signed)
  Problem: Fluid Volume: Goal: Ability to achieve a balanced intake and output will improve Outcome: Progressing   Problem: Metabolic: Goal: Ability to maintain appropriate glucose levels will improve Outcome: Progressing   Problem: Respiratory: Goal: Will regain and/or maintain adequate ventilation Outcome: Progressing   Problem: Urinary Elimination: Goal: Ability to achieve and maintain adequate renal perfusion and functioning will improve Outcome: Progressing

## 2018-08-19 NOTE — Progress Notes (Signed)
Critical ABG values reported to Dr. Ralene Bathe at 1245.

## 2018-08-19 NOTE — Progress Notes (Signed)
Pharmacy Antibiotic Note  Yvonne Reynolds is a 73 y.o. female admitted on 08/24/2018 with sepsis.  Pharmacy has been consulted for vancomycin/cefepime dosing. Afebrile, WBC 14.1. SCr 2.31 on admit.  Plan: Cefepime 2g IV x 1; then 2g IV q24h Vancomycin 1500mg  IV x1; then Vancomycin 1000 mg IV Q 48 hrs. Goal AUC 400-550. Expected AUC: 517 SCr used: 2.31 Flagyl 500mg  IV x 1 per EDP - f/u if to continue Monitor clinical progress, c/s, renal function F/u de-escalation plan/LOT, vancomycin levels as indicated   Height: 5\' 1"  (154.9 cm) Weight: 160 lb 15 oz (73 kg) IBW/kg (Calculated) : 47.8  Temp (24hrs), Avg:98.6 F (37 C), Min:98.6 F (37 C), Max:98.6 F (37 C)  Recent Labs  Lab 07/29/2018 1128  WBC 14.1*    CrCl cannot be calculated (Patient's most recent lab result is older than the maximum 21 days allowed.).    Allergies  Allergen Reactions  . Procaine Other (See Comments)    Heart race  . Adhesive [Tape] Rash and Other (See Comments)    SKIN IRRITATION    Antimicrobials this admission: 7/23 vancomycin >>  7/23 cefepime >>  7/23 flagyl x 1  Dose adjustments this admission:   Microbiology results:   Elicia Lamp, PharmD, BCPS Clinical Pharmacist 08/21/2018 12:47 PM

## 2018-08-19 NOTE — Progress Notes (Signed)
CRITICAL VALUE ALERT  Critical Value:  Lactic acid 5.9  Date & Time Notied:  08/11/2018 11:57 PM   Provider Notified: Dr. Lucile Shutters  Orders Received/Actions taken: awaiting orders

## 2018-08-20 ENCOUNTER — Inpatient Hospital Stay (HOSPITAL_COMMUNITY): Payer: Medicare HMO

## 2018-08-20 DIAGNOSIS — N179 Acute kidney failure, unspecified: Secondary | ICD-10-CM

## 2018-08-20 DIAGNOSIS — L8990 Pressure ulcer of unspecified site, unspecified stage: Secondary | ICD-10-CM | POA: Insufficient documentation

## 2018-08-20 DIAGNOSIS — E1110 Type 2 diabetes mellitus with ketoacidosis without coma: Secondary | ICD-10-CM | POA: Diagnosis present

## 2018-08-20 LAB — HEMOGLOBIN A1C
Hgb A1c MFr Bld: 14.3 % — ABNORMAL HIGH (ref 4.8–5.6)
Hgb A1c MFr Bld: 14.5 % — ABNORMAL HIGH (ref 4.8–5.6)
Mean Plasma Glucose: 363.71 mg/dL
Mean Plasma Glucose: 369.45 mg/dL

## 2018-08-20 LAB — POCT I-STAT 7, (LYTES, BLD GAS, ICA,H+H)
Acid-base deficit: 13 mmol/L — ABNORMAL HIGH (ref 0.0–2.0)
Bicarbonate: 12.6 mmol/L — ABNORMAL LOW (ref 20.0–28.0)
Calcium, Ion: 1.03 mmol/L — ABNORMAL LOW (ref 1.15–1.40)
HCT: 33 % — ABNORMAL LOW (ref 36.0–46.0)
Hemoglobin: 11.2 g/dL — ABNORMAL LOW (ref 12.0–15.0)
O2 Saturation: 95 %
Patient temperature: 37.7
Potassium: 3.9 mmol/L (ref 3.5–5.1)
Sodium: 148 mmol/L — ABNORMAL HIGH (ref 135–145)
TCO2: 13 mmol/L — ABNORMAL LOW (ref 22–32)
pCO2 arterial: 27.7 mmHg — ABNORMAL LOW (ref 32.0–48.0)
pH, Arterial: 7.27 — ABNORMAL LOW (ref 7.350–7.450)
pO2, Arterial: 86 mmHg (ref 83.0–108.0)

## 2018-08-20 LAB — CBC
HCT: 31.4 % — ABNORMAL LOW (ref 36.0–46.0)
HCT: 37.9 % (ref 36.0–46.0)
Hemoglobin: 10.1 g/dL — ABNORMAL LOW (ref 12.0–15.0)
Hemoglobin: 12 g/dL (ref 12.0–15.0)
MCH: 27.1 pg (ref 26.0–34.0)
MCH: 27.2 pg (ref 26.0–34.0)
MCHC: 31.7 g/dL (ref 30.0–36.0)
MCHC: 32.2 g/dL (ref 30.0–36.0)
MCV: 84.6 fL (ref 80.0–100.0)
MCV: 85.7 fL (ref 80.0–100.0)
Platelets: 168 10*3/uL (ref 150–400)
Platelets: DECREASED 10*3/uL (ref 150–400)
RBC: 3.71 MIL/uL — ABNORMAL LOW (ref 3.87–5.11)
RBC: 4.42 MIL/uL (ref 3.87–5.11)
RDW: 14.8 % (ref 11.5–15.5)
RDW: 15.1 % (ref 11.5–15.5)
WBC: 20.5 10*3/uL — ABNORMAL HIGH (ref 4.0–10.5)
WBC: 21.5 10*3/uL — ABNORMAL HIGH (ref 4.0–10.5)
nRBC: 0 % (ref 0.0–0.2)
nRBC: 0 % (ref 0.0–0.2)

## 2018-08-20 LAB — BASIC METABOLIC PANEL
Anion gap: 12 (ref 5–15)
Anion gap: 12 (ref 5–15)
BUN: 82 mg/dL — ABNORMAL HIGH (ref 8–23)
BUN: 83 mg/dL — ABNORMAL HIGH (ref 8–23)
CO2: 12 mmol/L — ABNORMAL LOW (ref 22–32)
CO2: 13 mmol/L — ABNORMAL LOW (ref 22–32)
Calcium: 7 mg/dL — ABNORMAL LOW (ref 8.9–10.3)
Calcium: 7 mg/dL — ABNORMAL LOW (ref 8.9–10.3)
Chloride: 119 mmol/L — ABNORMAL HIGH (ref 98–111)
Chloride: 120 mmol/L — ABNORMAL HIGH (ref 98–111)
Creatinine, Ser: 2.74 mg/dL — ABNORMAL HIGH (ref 0.44–1.00)
Creatinine, Ser: 2.89 mg/dL — ABNORMAL HIGH (ref 0.44–1.00)
GFR calc Af Amer: 18 mL/min — ABNORMAL LOW (ref >60)
GFR calc Af Amer: 19 mL/min — ABNORMAL LOW (ref >60)
GFR calc non Af Amer: 16 mL/min — ABNORMAL LOW (ref >60)
GFR calc non Af Amer: 17 mL/min — ABNORMAL LOW (ref >60)
Glucose, Bld: 364 mg/dL — ABNORMAL HIGH (ref 70–99)
Glucose, Bld: 493 mg/dL — ABNORMAL HIGH (ref 70–99)
Potassium: 3.7 mmol/L (ref 3.5–5.1)
Potassium: 3.9 mmol/L (ref 3.5–5.1)
Sodium: 144 mmol/L (ref 135–145)
Sodium: 144 mmol/L (ref 135–145)

## 2018-08-20 LAB — CBC WITH DIFFERENTIAL/PLATELET
Abs Immature Granulocytes: 0.18 10*3/uL — ABNORMAL HIGH (ref 0.00–0.07)
Basophils Absolute: 0.1 10*3/uL (ref 0.0–0.1)
Basophils Relative: 0 %
Eosinophils Absolute: 0 10*3/uL (ref 0.0–0.5)
Eosinophils Relative: 0 %
HCT: 39.2 % (ref 36.0–46.0)
Hemoglobin: 12.1 g/dL (ref 12.0–15.0)
Immature Granulocytes: 1 %
Lymphocytes Relative: 11 %
Lymphs Abs: 2.1 10*3/uL (ref 0.7–4.0)
MCH: 26.9 pg (ref 26.0–34.0)
MCHC: 30.9 g/dL (ref 30.0–36.0)
MCV: 87.1 fL (ref 80.0–100.0)
Monocytes Absolute: 0.7 10*3/uL (ref 0.1–1.0)
Monocytes Relative: 4 %
Neutro Abs: 16.8 10*3/uL — ABNORMAL HIGH (ref 1.7–7.7)
Neutrophils Relative %: 84 %
Platelets: 178 10*3/uL (ref 150–400)
RBC: 4.5 MIL/uL (ref 3.87–5.11)
RDW: 15 % (ref 11.5–15.5)
WBC: 19.9 10*3/uL — ABNORMAL HIGH (ref 4.0–10.5)
nRBC: 0 % (ref 0.0–0.2)

## 2018-08-20 LAB — COMPREHENSIVE METABOLIC PANEL
ALT: 1998 U/L — ABNORMAL HIGH (ref 0–44)
AST: 5954 U/L — ABNORMAL HIGH (ref 15–41)
Albumin: 1.8 g/dL — ABNORMAL LOW (ref 3.5–5.0)
Alkaline Phosphatase: 92 U/L (ref 38–126)
Anion gap: 11 (ref 5–15)
BUN: 77 mg/dL — ABNORMAL HIGH (ref 8–23)
CO2: 13 mmol/L — ABNORMAL LOW (ref 22–32)
Calcium: 6.4 mg/dL — CL (ref 8.9–10.3)
Chloride: 123 mmol/L — ABNORMAL HIGH (ref 98–111)
Creatinine, Ser: 2.53 mg/dL — ABNORMAL HIGH (ref 0.44–1.00)
GFR calc Af Amer: 21 mL/min — ABNORMAL LOW (ref >60)
GFR calc non Af Amer: 18 mL/min — ABNORMAL LOW (ref >60)
Glucose, Bld: 122 mg/dL — ABNORMAL HIGH (ref 70–99)
Potassium: 4.1 mmol/L (ref 3.5–5.1)
Sodium: 147 mmol/L — ABNORMAL HIGH (ref 135–145)
Total Bilirubin: 0.8 mg/dL (ref 0.3–1.2)
Total Protein: 3.9 g/dL — ABNORMAL LOW (ref 6.5–8.1)

## 2018-08-20 LAB — URINE CULTURE: Culture: >=100000 — AB

## 2018-08-20 LAB — MAGNESIUM
Magnesium: 2.2 mg/dL (ref 1.7–2.4)
Magnesium: 1.8 mg/dL (ref 1.7–2.4)

## 2018-08-20 LAB — GLUCOSE, CAPILLARY
Glucose-Capillary: 379 mg/dL — ABNORMAL HIGH (ref 70–99)
Glucose-Capillary: 413 mg/dL — ABNORMAL HIGH (ref 70–99)
Glucose-Capillary: 360 mg/dL — ABNORMAL HIGH (ref 70–99)
Glucose-Capillary: 344 mg/dL — ABNORMAL HIGH (ref 70–99)
Glucose-Capillary: 300 mg/dL — ABNORMAL HIGH (ref 70–99)
Glucose-Capillary: 265 mg/dL — ABNORMAL HIGH (ref 70–99)
Glucose-Capillary: 216 mg/dL — ABNORMAL HIGH (ref 70–99)
Glucose-Capillary: 208 mg/dL — ABNORMAL HIGH (ref 70–99)
Glucose-Capillary: 212 mg/dL — ABNORMAL HIGH (ref 70–99)
Glucose-Capillary: 190 mg/dL — ABNORMAL HIGH (ref 70–99)
Glucose-Capillary: 138 mg/dL — ABNORMAL HIGH (ref 70–99)
Glucose-Capillary: 160 mg/dL — ABNORMAL HIGH (ref 70–99)
Glucose-Capillary: 121 mg/dL — ABNORMAL HIGH (ref 70–99)
Glucose-Capillary: 111 mg/dL — ABNORMAL HIGH (ref 70–99)
Glucose-Capillary: 117 mg/dL — ABNORMAL HIGH (ref 70–99)

## 2018-08-20 LAB — HEPATIC FUNCTION PANEL
ALT: 2025 U/L — ABNORMAL HIGH (ref 0–44)
AST: 6515 U/L — ABNORMAL HIGH (ref 15–41)
Albumin: 2.1 g/dL — ABNORMAL LOW (ref 3.5–5.0)
Alkaline Phosphatase: 83 U/L (ref 38–126)
Bilirubin, Direct: 0.2 mg/dL (ref 0.0–0.2)
Indirect Bilirubin: 0.3 mg/dL (ref 0.3–0.9)
Total Bilirubin: 0.5 mg/dL (ref 0.3–1.2)
Total Protein: 4.4 g/dL — ABNORMAL LOW (ref 6.5–8.1)

## 2018-08-20 LAB — PHOSPHORUS
Phosphorus: 2.6 mg/dL (ref 2.5–4.6)
Phosphorus: 2.5 mg/dL (ref 2.5–4.6)

## 2018-08-20 LAB — CK
Total CK: 2576 U/L — ABNORMAL HIGH (ref 38–234)
Total CK: 3185 U/L — ABNORMAL HIGH (ref 38–234)

## 2018-08-20 LAB — BETA-HYDROXYBUTYRIC ACID: Beta-Hydroxybutyric Acid: 0.2 mmol/L (ref 0.05–0.27)

## 2018-08-20 LAB — RAPID URINE DRUG SCREEN, HOSP PERFORMED
Amphetamines: NOT DETECTED
Barbiturates: NOT DETECTED
Benzodiazepines: NOT DETECTED
Cocaine: NOT DETECTED
Opiates: NOT DETECTED
Tetrahydrocannabinol: NOT DETECTED

## 2018-08-20 LAB — LACTIC ACID, PLASMA
Lactic Acid, Venous: 6.2 mmol/L (ref 0.5–1.9)
Lactic Acid, Venous: 5.9 mmol/L (ref 0.5–1.9)
Lactic Acid, Venous: 4.3 mmol/L (ref 0.5–1.9)

## 2018-08-20 LAB — LIPASE, BLOOD: Lipase: 34 U/L (ref 11–51)

## 2018-08-20 LAB — MRSA PCR SCREENING: MRSA by PCR: NEGATIVE

## 2018-08-20 LAB — AMYLASE: Amylase: 935 U/L — ABNORMAL HIGH (ref 28–100)

## 2018-08-20 MED ORDER — INSULIN DETEMIR 100 UNIT/ML ~~LOC~~ SOLN
20.0000 [IU] | Freq: Two times a day (BID) | SUBCUTANEOUS | Status: DC
  Administered 2018-08-20: 16:00:00 20 [IU] via SUBCUTANEOUS
  Filled 2018-08-20 (×2): qty 0.2

## 2018-08-20 MED ORDER — VASOPRESSIN 20 UNIT/ML IV SOLN
0.0300 [IU]/min | INTRAVENOUS | Status: DC
  Administered 2018-08-20 – 2018-08-22 (×3): 0.03 [IU]/min via INTRAVENOUS
  Filled 2018-08-20 (×5): qty 2

## 2018-08-20 MED ORDER — DEXTROSE-NACL 5-0.45 % IV SOLN
INTRAVENOUS | Status: DC
  Administered 2018-08-20: 08:00:00 via INTRAVENOUS

## 2018-08-20 MED ORDER — MIDAZOLAM HCL 2 MG/2ML IJ SOLN
2.0000 mg | Freq: Once | INTRAMUSCULAR | Status: AC
  Administered 2018-08-20: 2 mg via INTRAVENOUS
  Filled 2018-08-20: qty 2

## 2018-08-20 MED ORDER — SODIUM CHLORIDE 0.9 % IV SOLN
200.0000 mg | Freq: Once | INTRAVENOUS | Status: AC
  Administered 2018-08-20: 07:00:00 200 mg via INTRAVENOUS
  Filled 2018-08-20: qty 20

## 2018-08-20 MED ORDER — SODIUM CHLORIDE 0.9 % IV SOLN
INTRAVENOUS | Status: DC
  Administered 2018-08-20: via INTRAVENOUS

## 2018-08-20 MED ORDER — SODIUM CHLORIDE 0.9 % IV SOLN
80.0000 mg | Freq: Once | INTRAVENOUS | Status: AC
  Administered 2018-08-20: 80 mg via INTRAVENOUS
  Filled 2018-08-20: qty 80

## 2018-08-20 MED ORDER — STERILE WATER FOR INJECTION IV SOLN
INTRAVENOUS | Status: DC
  Administered 2018-08-20 – 2018-08-22 (×6): via INTRAVENOUS
  Filled 2018-08-20 (×10): qty 850

## 2018-08-20 MED ORDER — SODIUM BICARBONATE 8.4 % IV SOLN
INTRAVENOUS | Status: DC
  Filled 2018-08-20: qty 150

## 2018-08-20 MED ORDER — AMIODARONE LOAD VIA INFUSION
150.0000 mg | Freq: Once | INTRAVENOUS | Status: AC
  Administered 2018-08-20: 150 mg via INTRAVENOUS
  Filled 2018-08-20: qty 83.34

## 2018-08-20 MED ORDER — PHENYTOIN SODIUM 50 MG/ML IJ SOLN
80.0000 mg | Freq: Three times a day (TID) | INTRAMUSCULAR | Status: DC
  Administered 2018-08-21 – 2018-08-23 (×7): 80 mg via INTRAVENOUS
  Filled 2018-08-20 (×7): qty 2

## 2018-08-20 MED ORDER — MIDAZOLAM 50MG/50ML (1MG/ML) PREMIX INFUSION
40.0000 mg/h | INTRAVENOUS | Status: DC
  Administered 2018-08-20 – 2018-08-21 (×2): 2 mg/h via INTRAVENOUS
  Administered 2018-08-21 (×2): 40 mg/h via INTRAVENOUS
  Administered 2018-08-21 (×3): 20 mg/h via INTRAVENOUS
  Filled 2018-08-20 (×7): qty 50

## 2018-08-20 MED ORDER — VITAL HIGH PROTEIN PO LIQD
1000.0000 mL | ORAL | Status: DC
  Administered 2018-08-20 – 2018-08-22 (×3): 1000 mL
  Filled 2018-08-20 (×2): qty 1000

## 2018-08-20 MED ORDER — SODIUM CHLORIDE 0.9 % IV SOLN
200.0000 mg | Freq: Two times a day (BID) | INTRAVENOUS | Status: DC
  Filled 2018-08-20 (×2): qty 20

## 2018-08-20 MED ORDER — SODIUM CHLORIDE 0.9 % IV BOLUS: 1000.0000 mL | Freq: Once | INTRAVENOUS | Status: AC

## 2018-08-20 MED ORDER — SODIUM CHLORIDE 0.9 % IV BOLUS
1000.0000 mL | Freq: Once | INTRAVENOUS | Status: AC
  Administered 2018-08-20: 1000 mL via INTRAVENOUS

## 2018-08-20 MED ORDER — CALCIUM GLUCONATE-NACL 2-0.675 GM/100ML-% IV SOLN
2.0000 g | Freq: Once | INTRAVENOUS | Status: AC
  Administered 2018-08-20: 16:00:00 2000 mg via INTRAVENOUS
  Filled 2018-08-20: qty 100

## 2018-08-20 MED ORDER — AMIODARONE HCL IN DEXTROSE 360-4.14 MG/200ML-% IV SOLN
60.0000 mg/h | INTRAVENOUS | Status: AC
  Administered 2018-08-20: 60 mg/h via INTRAVENOUS
  Filled 2018-08-20 (×2): qty 200

## 2018-08-20 MED ORDER — LACTATED RINGERS IV BOLUS
1000.0000 mL | Freq: Once | INTRAVENOUS | Status: AC
  Administered 2018-08-20: 06:00:00 1000 mL via INTRAVENOUS

## 2018-08-20 MED ORDER — PRO-STAT SUGAR FREE PO LIQD
30.0000 mL | Freq: Four times a day (QID) | ORAL | Status: DC
  Administered 2018-08-20 – 2018-08-23 (×11): 30 mL
  Filled 2018-08-20 (×11): qty 30

## 2018-08-20 MED ORDER — POTASSIUM CHLORIDE 10 MEQ/100ML IV SOLN
10.0000 meq | INTRAVENOUS | Status: AC
  Administered 2018-08-20 (×2): 10 meq via INTRAVENOUS
  Filled 2018-08-20 (×2): qty 100

## 2018-08-20 MED ORDER — SODIUM CHLORIDE 0.9 % IV BOLUS
500.0000 mL | Freq: Once | INTRAVENOUS | Status: AC
  Administered 2018-08-20: 01:00:00 500 mL via INTRAVENOUS

## 2018-08-20 MED ORDER — SODIUM CHLORIDE 0.9 % IV SOLN: INTRAVENOUS | Status: DC

## 2018-08-20 MED ORDER — SODIUM CHLORIDE 0.9 % IV SOLN
8.0000 mg/h | INTRAVENOUS | Status: DC
  Administered 2018-08-20 – 2018-08-23 (×7): 8 mg/h via INTRAVENOUS
  Filled 2018-08-20 (×10): qty 80

## 2018-08-20 MED ORDER — SODIUM CHLORIDE 0.9 % IV SOLN
1400.0000 mg | Freq: Once | INTRAVENOUS | Status: AC
  Administered 2018-08-20: 15:00:00 1400 mg via INTRAVENOUS
  Filled 2018-08-20: qty 28

## 2018-08-20 MED ORDER — SODIUM CHLORIDE 0.9 % IV BOLUS
1000.0000 mL | Freq: Once | INTRAVENOUS | Status: AC
  Administered 2018-08-20: 09:00:00 1000 mL via INTRAVENOUS

## 2018-08-20 MED ORDER — CLOBAZAM 10 MG PO TABS
20.0000 mg | ORAL_TABLET | Freq: Once | ORAL | Status: AC
  Administered 2018-08-20: 12:00:00 20 mg via ORAL
  Filled 2018-08-20: qty 2

## 2018-08-20 MED ORDER — PANTOPRAZOLE SODIUM 40 MG IV SOLR: 40.0000 mg | Freq: Two times a day (BID) | INTRAVENOUS | Status: DC

## 2018-08-20 MED ORDER — INSULIN ASPART 100 UNIT/ML ~~LOC~~ SOLN: 1.0000 [IU] | SUBCUTANEOUS | Status: DC

## 2018-08-20 MED ORDER — AMIODARONE HCL IN DEXTROSE 360-4.14 MG/200ML-% IV SOLN
30.0000 mg/h | INTRAVENOUS | Status: DC
  Administered 2018-08-21 – 2018-08-23 (×5): 30 mg/h via INTRAVENOUS
  Filled 2018-08-20 (×9): qty 200

## 2018-08-20 MED ORDER — PHENYLEPHRINE HCL-NACL 10-0.9 MG/250ML-% IV SOLN
0.0000 ug/min | INTRAVENOUS | Status: DC
  Administered 2018-08-21: 09:00:00 50 ug/min via INTRAVENOUS
  Filled 2018-08-20 (×2): qty 250

## 2018-08-20 MED ORDER — PIPERACILLIN-TAZOBACTAM 3.375 G IVPB
3.3750 g | Freq: Two times a day (BID) | INTRAVENOUS | Status: DC
  Administered 2018-08-21 – 2018-08-23 (×5): 3.375 g via INTRAVENOUS
  Filled 2018-08-20 (×7): qty 50

## 2018-08-20 NOTE — Progress Notes (Signed)
eLink Physician-Brief Progress Note Patient Name: Yvonne Reynolds DOB: 04/05/1945 MRN: [id number]   Date of Service  08/20/2018  HPI/Events of Note  Lactic acid 5.9  eICU Interventions  Normal Saline 500 ml iv bolus over two hours, repeat Lactic acid at 4 am        Frederik Pear 08/20/2018, 12:36 AM

## 2018-08-20 NOTE — Progress Notes (Signed)
Still with breakthrough sz on EEG, additional 200mg  IV vimpat and increase to 200mg  BID. Also will increase propofol to 50 mcg/kg/min.   Roland Rack, MD [facility] [phone]  If 7pm- 7am, please page neurology on call as listed in [place].

## 2018-08-20 NOTE — Progress Notes (Signed)
EEG maintenance complete. No skin breakdown / continue to monitor

## 2018-08-20 NOTE — Progress Notes (Signed)
eLink Physician-Brief Progress Note Patient Name: Yvonne Reynolds DOB: Jun 29, 1945 MRN: [id number]   Date of Service  08/20/2018  HPI/Events of Note  RN reports pt admitted with DKA. DKA order set not ordered.  eICU Interventions  DKA order set ordered.        [name] 08/20/2018, 12:06 AM

## 2018-08-20 NOTE — Progress Notes (Addendum)
PCCM progress note  Patient has continued to deteriorate through the day required increasing pressors Started having episodes of tachycardia, paroxysmal atrial fibrillation One episode of bloody bowel movement  Called and discussed with grandson over the phone.  With multiorgan failure her prognosis is grim.  I am not sure if she will be able to survive this admission I have asked him to come in for a visit.  CODE STATUS changed to DNR We will hold off on consulting nephrology and GI as I do not believe she will be able to tolerate CVVH or EGD Start PPI drip  The patient is critically ill with multiple organ system failure and requires high complexity decision making for assessment and support, frequent evaluation and titration of therapies, advanced monitoring, review of radiographic studies and interpretation of complex data.   Critical Care Time devoted to patient care services, exclusive of separately billable procedures, described in this note is 35 minutes.   Marshell Garfinkel MD [facility] Pager [phone] If no answer call [phone] 08/20/2018, 3:42 PM

## 2018-08-20 NOTE — Progress Notes (Signed)
PHENYTOIN CONSULT NOTE - INITIAL   Pharmacy Consult for Phenytoin dosing Indication: Status Epilepticus   Allergies  Allergen Reactions  . Procaine Other (See Comments)    Heart race  . Adhesive [Tape] Rash and Other (See Comments)    SKIN IRRITATION    Patient Measurements: Height: '5\' 1"'$  (154.9 cm) Weight: 153 lb 10.6 oz (69.7 kg) IBW/kg (Calculated) : 47.8  Vital Signs: Temp: 98.2 F (36.8 C) (07/24 1800) Temp Source: Bladder (07/24 1200) BP: 100/52 (07/24 1800) Pulse Rate: 112 (07/24 1800) Intake/Output from previous day: 07/23 0701 - 07/24 0700 In: 6431.7 [I.V.:2456.5; IV Piggyback:3975.2] Out: 455 [Urine:255; Emesis/NG output:200] Intake/Output from this shift: Total I/O In: 6595 [I.V.:2596.6; IV Piggyback:3998.4] Out: 210 [Urine:210]  Labs: Recent Labs    08/02/2018 1128  08/20/2018 1231  08/07/2018 2020 08/20/18 0006 08/20/18 0435 08/20/18 1406 08/20/18 1416 08/20/18 1543  WBC 14.1*  --   --   --   --  20.5* 19.9*  --   --  21.5*  HGB 10.2*   < >  --    < >  --  12.0 12.1  --  11.2* 10.1*  HCT 35.0*   < >  --    < >  --  37.9 39.2  --  33.0* 31.4*  PLT 268  --   --   --   --  168 178  --   --  PLATELET CLUMPS NOTED ON SMEAR, COUNT APPEARS DECREASED  APTT  --   --  27  --   --   --   --   --   --   --   CREATININE 2.31*  --   --   --  2.99* 2.89* 2.74* 2.53*  --   --   MG  --   --   --   --  2.3  --  2.2 1.8  --   --   PHOS  --   --   --   --  4.6  --  2.6 2.5  --   --   ALBUMIN 2.2*  --   --   --   --   --  2.1* 1.8*  --   --   PROT 4.7*  --   --   --   --   --  4.4* 3.9*  --   --   AST 64*  --   --   --   --   --  5,188* 4,166*  --   --   ALT 37  --   --   --   --   --  2,025* 1,998*  --   --   ALKPHOS 72  --   --   --   --   --  83 92  --   --   BILITOT 1.3*  --   --   --   --   --  0.5 0.8  --   --   BILIDIR  --   --   --   --   --   --  0.2  --   --   --   IBILI  --   --   --   --   --   --  0.3  --   --   --    < > = values in this interval not  displayed.   Estimated Creatinine Clearance: 18.2 mL/min (A) (by C-G formula based on SCr of 2.53  mg/dL (H)).   Medical History: Past Medical History:  Diagnosis Date  . Complication of anesthesia    states was hard to wake up after 1st EGD  . Depression   . GERD (gastroesophageal reflux disease)   . High cholesterol   . History of seizure    x 1 - due to hyperglycemia  . Hypertension    states under control with med., has been on med. > 20 yr.  . Insulin dependent diabetes mellitus ([place])   . Macular degeneration   . Nocturia   . Osteoarthritis    shoulder  . Osteoporosis   . Popping of temporomandibular joint on opening of jaw   . Sebaceous cyst 09/2014   chest wall - sternum  . Toenail fungus     Medications:  Scheduled:  . chlorhexidine gluconate (MEDLINE KIT)  15 mL Mouth Rinse BID  . Chlorhexidine Gluconate Cloth  6 each Topical Daily  . enoxaparin (LOVENOX) injection  30 mg Subcutaneous Q24H  . feeding supplement (PRO-STAT SUGAR FREE 64)  30 mL Per Tube QID  . feeding supplement (VITAL HIGH PROTEIN)  1,000 mL Per Tube Q24H  . insulin aspart  1-3 Units Subcutaneous Q4H  . insulin detemir  20 Units Subcutaneous Q12H  . mouth rinse  15 mL Mouth Rinse 10 times per day  . pantoprazole (PROTONIX) IV  40 mg Intravenous QHS  . [START ON 08/24/2018] pantoprazole  40 mg Intravenous Q12H  . [START ON 08/21/2018] phenytoin (DILANTIN) IV  80 mg Intravenous Q8H  . sodium chloride flush  10-40 mL Intracatheter Q12H   Infusions:  . sodium chloride Stopped (08/20/18 1410)  . amiodarone 60 mg/hr (08/20/18 1800)   Followed by  . amiodarone    . lactated ringers Stopped (08/20/18 0809)  . levETIRAcetam 432 mL/hr at 08/20/18 1800  . midazolam 2 mg/hr (08/20/18 1800)  . norepinephrine (LEVOPHED) Adult infusion 15 mcg/min (08/20/18 1800)  . pantoprozole (PROTONIX) infusion 8 mg/hr (08/20/18 1800)  . phenylephrine (NEO-SYNEPHRINE) Adult infusion Stopped (08/20/18 1630)  . [START ON  08/21/2018] piperacillin-tazobactam (ZOSYN)  IV    . propofol (DIPRIVAN) infusion 80 mcg/kg/min (08/20/18 1800)  .  sodium bicarbonate (isotonic) infusion in sterile water 125 mL/hr at 08/20/18 1800  . vasopressin (PITRESSIN) infusion - *FOR SHOCK* 0.03 Units/min (08/20/18 1800)    Assessment: 73 y.o. female admitted on 08/18/2018 with sepsis. CT head showed subacuteto chronicinfarct of the left frontal lobe. EEG showed status epilepticus. Pharmacy has been consulted for phenytoin dosing. MD is aware of elevated LFTs. Of note: patient is on an amiodarone gtt which may increase phenytoin levels.   Goal of Therapy:  Phenytoin level: 10-20 mcg/ml  Plan:  -Initiate Phenytoin 80 mg IV q8h -Check level 5-7 days after therapy initiation  -Monitor LFTs    Lorel Monaco, PharmD PGY1 Ambulatory Care Resident [facility] # [phone]

## 2018-08-20 NOTE — Progress Notes (Signed)
Family at bedside.  Updated them regarding care and good chance patient will pass overnight.  Continue current level of care, hold off on HD unless there is a remarkable hemodynamic improvement.  Erskine Emery MD

## 2018-08-20 NOTE — Progress Notes (Signed)
Increased propofol gtt to 60 per Dr. Leonel Ramsay.

## 2018-08-20 NOTE — Progress Notes (Addendum)
PULMONARY / CRITICAL CARE MEDICINE   NAME:  Yvonne Reynolds, MRN:  [id number], DOB:  11/14/1945, LOS: 1 ADMISSION DATE:  08/04/2018, CONSULTATION DATE:  08/14/2018 REFERRING MD:  ED, Dr. Ralene Bathe, CHIEF COMPLAINT:  AMS   BRIEF HISTORY:    Yvonne Reynolds is a 73 y.o. female who presented to the ED via EMS and was placed on vent on arrival.    HISTORY OF PRESENT ILLNESS   Patient was last known well at 3 PM on 08/18/2018.  Per EMS report, she was unresponsive in a very hot home with a bottle of Pepcid near her.  She was covered in urine.  Neighbor heard her screaming at her dog yesterday at 3 PM.  The following medical history is taken from recent clinic notes. Patient's past medical history is significant for IDDM complicated by retinopathy and nephropathy and has been on insulin since 1999.  Her endocrinology notes at low [facility], patient has never been in DKA, though has had a severe hypoglycemic episode in 2017).  Patient's outpatient visit notes have our revealing of her recent medical history.  In May 2020, she was seen by Dr. Loanne Drilling (endocrinologist) where patient had reported that she stopped taking all of her medications due to depression.  He also emergently sent her to Dr. Charlton Amor office as patient reported frequent falls. For her depression, patient takes Effexor 150 mg daily.  She lives by herself and reports that her family has not been visiting her due to [place].  Patient also has hypertension that was previously controlled on medication however, patient has not been compliant with medications. SIGNIFICANT PAST MEDICAL HISTORY   Depression GERD Dyslipidemia Seizure due to hypoglycemia Hypertension, poor recent medication compliance IDDM, complicated by retinopathy and nephropathy on insulin since 1999 Macular degeneration Nocturia Osteoarthritis  SIGNIFICANT EVENTS:  7/23 >> admitted & Intubated   STUDIES:   CXR >> ET tube in place.  No airspace disease. CT Head >> no acute  intracranial abnormality.  Ischemic and atrophic changes noted that are subacute to chronic. EEG >> epileptogenicity in left fronto-parietal region as well as moderate diffuse encephalopathy. No seizures were seen throughout the recording. CULTURES:  7/23 Blood culture x2 >>  7/23 urine culture >>   ANTIBIOTICS:  Cefepime 2g 7/23 >>  Flagyl 500 mg 7/23 >> 7/24  Vancomycin 7/23 >>7/23 LINES/TUBES:   Patient Lines/Drains/Airways Status   Active Line/Drains/Airways    Name:   Placement date:   Placement time:   Site:   Days:   Peripheral IV 08/04/2018 Right Hand   07/30/2018    1130    Hand   less than 1   Peripheral IV 08/13/2018 Left Antecubital   08/25/2018    1149    Antecubital   less than 1   Peripheral IV 08/04/2018 Left Forearm   07/28/2018    1149    Forearm   less than 1   Peripheral IV 07/28/2018 Right Forearm   08/24/2018    1204    Forearm   less than 1   Urethral Catheter Camryn Cogan, NT Temperature probe 10 Fr.   07/29/2018    1220    Temperature probe   less than 1   Airway 7 mm   08/10/2018    1155     less than 1   Incision 07/23/12 Ankle Left   07/23/12    2135     2218   Incision (Closed) 10/17/14 Chest Other (Comment)   10/17/14  0825     1402          CONSULTANTS:  Neurology   OBJECTIVE   Temp:  [96.6 F (35.9 C)-99.1 F (37.3 C)] 96.6 F (35.9 C) (07/24 0800) Pulse Rate:  [75-109] 80 (07/24 0800) Resp:  [22-42] 29 (07/24 0800) BP: (78-130)/(34-76) 112/53 (07/24 0800) SpO2:  [96 %-100 %] 100 % (07/24 0800) FiO2 (%):  [30 %-100 %] 30 % (07/24 0800) Weight:  [64.1 kg-73 kg] 69.7 kg (07/24 0334)   Vent Mode: PRVC FiO2 (%):  [30 %-100 %] 30 % Set Rate:  [25 bmp] 25 bmp Vt Set:  [380 mL] 380 mL PEEP:  [5 cmH20] 5 cmH20 Plateau Pressure:  [13 cmH20-15 cmH20] 15 cmH20  PHYSICAL EXAM: General: Elderly white female, lying in bed sedated.  Well-groomed. HEENT: Pupils are 30mm and fixed. Cardiovascular: Regular rate and rhythm.  Third heart sound appreciated, possibly S4.  No lower extremity edema. Lungs: Clear to auscultation anteriorly.  Patient on ventilation. Abdomen: Soft, nondistended.  Positive bowel sounds.  Musculoskeletal: Muscle bulk appropriate for patient's age. Skin: Soft, pink, warm. Extremities: Right tibial IO access  Neuro: no lower extremity reflexes appreciated. Pupils pinpoint and fixed. No corneal reflex  ASSESSMENT AND PLAN   Multiorgan failure. Liver, acute transaminitis in the thousands Renal, AKI, oliguria Neuro, seizures Pulmonary, respiratory failure   Seizure activity   For sedation, should try benzodiasepene in setting of seizures. Versed is poor choice in patient's renal and hepatic function. Neurology on board. EEG in ED with left fronto-parietal epileptic activity. Currently with video EEg. Patient loaded with keppra, increased vimpat. Additionally started Onfi.   Appreciate neurology recommendations  Follow-up EEG  Non elevated AGMA Patient with elevated anion gap acidosis yesterday, most likely exacerbated by acidosis from diabetes.  Now non elevated AGMA, Beta hydroxybutyrate has now resolved.  Chloride is within normal limits. Patient has been on LRs that could falsely elevate her lactate on labs. There is no anion gap and remainder of electrolytes remain normal. Her kidneys are not functioning at this time  - start bicarb   Transaminitis  Acute elevations in ALT AST overnight.  Most likely due to ischemia given patient's presentation and reperfusion injury. Possibly due to seizure medication? 500 BID keppra given her renal and hepatic status. Amylase is also elevated; however, bilirubin wnl.  No further work up at this time. Can consider RUQ U/S or further imaging if other concerns.   HHS/DKA, resolved  BHB is 0.2 this AM form 7.9 yesterday. Anion gap normal x 2.  Despite this, will continue insulin drip at this time as patients condition is unstable.   Rhabdo CK elevated  - aggressive fluid resuscitation  -  start bicarb - monitor fluid status in setting of oliguria and AKI   AKI with oliguria Patient has not made any urine overnight.  Despite heavy fluid resuscitation.  Consult nephrology for CRRT if patient becomes volume overloaded   Acute Respiratory Failure Likely due to acidosis.  Patient is currently on propofol drip, Levophed 11 mcg/min.  O2 Sats > 92%  Wean as possible   Pulmonary hygiene   PAD  Bacteruria UA with few yeast and bacteria, the patient is unable to tell us whether she is symptomatic. Empiric antibiotic started in the ED, vancomycin, cefepime, Flagyl. Blood cultures with no growth and no fever.   F/u urine and bld cx  D/c vanc and flagyl   Continue Cefepime (renally dosed)   IDDM At home, patient is on  65 units of Novolin daily and 10 units of NPH at bedtime.  Patient has history of poor medication compliance.  Per progress notes, financial issues prevent patient from obtaining diabetic medications that would be easier for this patient to manage.  Diabetes coordinator  Management as above  Depression Patient on venlafaxine XR 150 mg daily with breakfast. Decrease to 75 daily 2/2 renal function.   While NPO, will hold at this time.   HS Troponin  Elevated at 56-63.  Likely demand ischemia given patient's current status.  No consult to cardiology at this time.  HTN At home 10 mg lisinopril daily.  No medications at the time given hypotension.  Best Practice / Goals of Care / Disposition.   DVT PROPHYLAXIS:Low dose lovenox  SUP: Yes NUTRITION: NPO, nutrition consulted  MOBILITY: BR GOALS OF CARE: not  FAMILY DISCUSSIONS: Vernia Buff [phone] DISPOSITION ICU   Wilber Oliphant, M.D.  PGY-2  Family Medicine  [phone] 08/20/2018 9:12 AM  -----------------------------------------------------------------------------------------------------------------------------  Attending note: I have seen and examined the patient. History, labs and  imaging reviewed.  Briefly this 73 year old with poorly controlled diabetes admitted after being found down with profound DKA, acidosis, status epilepticus, shock liver, rhabdomyolysis, septic shock  Overnight continues on Levophed.  Labs worsening with elevated WBC count, LFTs, lactic acid Continues to have breakthrough seizures  Blood pressure (!) 112/53, pulse 80, temperature (!) 96.6 F (35.9 C), resp. rate (!) 29, height 5\' 1"  (1.549 m), weight 69.7 kg, SpO2 100 %. Gen:      Chronically ill-appearing HEENT:  EOMI, sclera anicteric Neck:     No masses; no thyromegaly, ET tube Lungs:    Clear to auscultation bilaterally; normal respiratory effort CV:         Regular rate and rhythm; no murmurs Abd:      + bowel sounds; soft, non-tender; no palpable masses, no distension Ext:    No edema; adequate peripheral perfusion Skin:      Warm and dry; no rash Neuro: Sedated, unresponsive  Labs/Imaging personally reviewed, significant for Glucose 364, BUN/creatinine 82/2.74 AST 6515, ALT 2025, CK 2576, lactic acid 5.9 WBC 19.9 No new imaging  Assessment/plan: 73 year old presenting with DKA, severe acidosis, AKI, seizures and shock, multiorgan failure  DKA, shock, profound dehydration Continue IV fluids hydration Insulin drip. Continue broad antibiotic coverage. DC vanco as MRSA swab is negative Bedside US shows normal RV function, hyperdynamic LV, unable to visualize the IVC. No pericardial effusion  Elevated CK, rhabdo Start bicarb drip to protect kidney  Elevated LFTs, amylase, lipase Likely secondary to shock Monitor labs  Seizures, Subacute/chronic MCA stroke EEG monitoring for seizures Keppra, vimpat, clobazam  AKI, non gap acidosis Bicarb drip. Check Abd ultrasound May need renal consult if urine output does not pick up  The patient is critically ill with multiple organ systems failure and requires high complexity decision making for assessment and support, frequent  evaluation and titration of therapies, application of advanced monitoring technologies and extensive interpretation of multiple databases.  Critical care time - 35 mins. This represents my time independent of the NPs time taking care of the pt.  Marshell Garfinkel MD [facility] 08/20/2018, 9:35 AM

## 2018-08-20 NOTE — Progress Notes (Signed)
Initial Nutrition Assessment  DOCUMENTATION CODES:   Not applicable  INTERVENTION:    Vital High Protein at 20 ml/h (480 ml per day)   Pro-stat 30 ml QID   Provides 880 kcal (1804 kcal total including kcal from propofol), 102 gm protein, 401 ml free water daily  NUTRITION DIAGNOSIS:   Inadequate oral intake related to inability to eat as evidenced by NPO status.  GOAL:   Patient will meet greater than or equal to 90% of their needs  MONITOR:   Vent status, TF tolerance, Labs, Skin  REASON FOR ASSESSMENT:   Ventilator, Consult Enteral/tube feeding initiation and management  ASSESSMENT:   73 yo female admitted with DKA, intubated on admission. CT head showed subacute chronic infarct. EEG showed status epilepticus. PMH includes seizure 2/2 hypoglycemia, HLD, osteoporosis, HTN, GERD.   Received MD Consult for TF recommendations. Discussed with attending MD, okay to begin TF.   Patient is currently intubated on ventilator support MV: 10.8 L/min Temp (24hrs), Avg:97.5 F (36.4 C), Min:96.6 F (35.9 C), Max:99.1 F (37.3 C)  Propofol: 30 ml/hr providing 924 kcal from lipid.  Labs reviewed.  A1C 14.5 (H), BUN 82 (H), creatinine 2.74 (H) CBG's: 212-208-190  Medications reviewed and include IV insulin, levophed, propofol.   Per review of weight encounters, patient has lost 11% of her usual weight within the past 6 months. Unsure cause of weight loss. Unable to obtain any nutrition history from patient. Patient is at increased nutrition risk, given history of significant weight loss.  NUTRITION - FOCUSED PHYSICAL EXAM:    Most Recent Value  Orbital Region  Mild depletion  Upper Arm Region  Unable to assess  Thoracic and Lumbar Region  Unable to assess  Buccal Region  Unable to assess  Temple Region  Mild depletion  Clavicle Bone Region  No depletion  Clavicle and Acromion Bone Region  No depletion  Scapular Bone Region  Unable to assess  Dorsal Hand  Unable to  assess  Patellar Region  No depletion  Anterior Thigh Region  No depletion  Posterior Calf Region  Unable to assess  Edema (RD Assessment)  Unable to assess  Hair  Reviewed  Eyes  Unable to assess  Mouth  Unable to assess  Skin  Reviewed  Nails  Unable to assess       Diet Order:   Diet Order            Diet NPO time specified  Diet effective now              EDUCATION NEEDS:   No education needs have been identified at this time  Skin:  Skin Assessment: Skin Integrity Issues: Skin Integrity Issues:: DTI DTI: back and sacrum  Last BM:  PTA  Height:   Ht Readings from Last 1 Encounters:  08/22/2018 5\' 1"  (1.549 m)    Weight:   Wt Readings from Last 1 Encounters:  08/20/18 69.7 kg    Ideal Body Weight:  47.7 kg  BMI:  Body mass index is 29.03 kg/m.  Estimated Nutritional Needs:   Kcal:  1460  Protein:  95-110 gm  Fluid:  >/= 1.5 L    Molli Barrows, RD, LDN, [facility] Pager [phone] After Hours Pager [phone]

## 2018-08-20 NOTE — Procedures (Signed)
Arterial Catheter Insertion Procedure Note SABELLA TRAORE [id number] March 17, 1945  Procedure: Insertion of Arterial Catheter  Indications: Blood pressure monitoring and Frequent blood sampling  Procedure Details Consent: Unable to obtain consent because of altered level of consciousness. Time Out: Verified patient identification, verified procedure, site/side was marked, verified correct patient position, special equipment/implants available, medications/allergies/relevent history reviewed, required imaging and test results available.  Performed  Maximum sterile technique was used including antiseptics, cap, gloves, gown, hand hygiene, mask and sheet. Skin prep: Chlorhexidine; local anesthetic administered 20 gauge catheter was inserted into left femoral artery using the Seldinger technique. ULTRASOUND GUIDANCE USED: YES Evaluation Blood flow good; BP tracing good. Complications: No apparent complications.  Multiple attempts before successful vessel cannulation.  Attempted right femoral first.  Vessel cannulated (with difficulty) but unable to pass guidewire.  Aborted site and switched to left femoral.  Again, difficulty passing guidewire; however, ultimately was able to do so.  Good arterial waveform on monitor and good drawback.   Procedure performed under direct ultrasound guidance for real time vessel cannulation.     Montey Hora, [facility] Pager: [phone].  If no answer, [phone] 08/20/2018, 2:02 PM

## 2018-08-20 NOTE — Progress Notes (Signed)
RT attempted a line x 3 w/ no success.  No complications noted.  RN notified MD.  Waiting for a-line to obtain ABG.

## 2018-08-20 NOTE — Progress Notes (Signed)
Worsening labs, BP persistently decreasing, temp decreasing- MD aware, orders given to start 1L NS bolus, CVP 7 prior to bolus. Granddaughter called this morning for an update, aware of guarded condition and need for aggressive interventions, verbalized understanding. Will continue to monitor.

## 2018-08-20 NOTE — Progress Notes (Signed)
eLink Physician-Brief Progress Note Patient Name: Yvonne Reynolds DOB: [birth year] MRN: [id number]   Date of Service  08/20/2018  HPI/Events of Note  Lactate 6.1  eICU Interventions  Lactated Ringers 1000 ml iv bolus over 2 hours x 1        Okoronkwo U Ogan 08/20/2018, 5:55 AM

## 2018-08-20 NOTE — Procedures (Addendum)
Patient Name: Yvonne Reynolds  MRN: [id number]  Epilepsy Attending: Lora Havens  Referring Physician/Provider: Dr Marshell Garfinkel Date: 08/13/2018 1945 to 08/20/2018 0730  Patient history: 73yo female with prior left frontal stroke who presented with ams and right arm posturing concerning for sz. EEG ordered to evaluate.   Technical Details:  Long-term video-EEG monitoring was performed using standard setting per the guidelines.  Briefly, a minimum of 21 electrodes were placed on scalp according to the International 10-20 or/and 10-10 Systems.  Supplemental electrodes were placed as needed.  Single EKG electrode was also used to detect cardiac arrhythmia.  Patient's behavior was continuously recorded on video simultaneously with EEG.  A minimum of 16 channels were used for data display.  Each epoch of study was reviewed manually daily and as needed using standard referential and bipolar montages.    EEG Description:   Background: Burst suppression seen as high amplitude delta slowing lasting 1-2 seconds followed by 2-4 seconds of background suppression.  Interictal: Frequent spikes and sharp waves were seen in right frontotemporal region more than left fronto-parietal region.  Ictal: Total 41 seizures arising from right frontotemporal region, lasting from 30-60 seconds on an average with no clear clinical signs were recorded during this study. Last seizure was at 0723 am.   Impression:  This  EEG showed patient is in status epilepticus with 41 seizures during the duration of this study, arising from right frontotemporal region, each lasting 30-60 seconds with no clear clinical signs. Last seizure was at 723am. In addition there is evidence of profound encephalopathy.    Ulises Wolfinger Barbra Sarks

## 2018-08-20 NOTE — Progress Notes (Signed)
CRITICAL VALUE ALERT  Critical Value:  Lactic acid 6.2  Date & Time Notied:  08/20/18 0545  Provider Notified: Lucile Shutters, MD  Orders Received/Actions taken: See new orders

## 2018-08-20 NOTE — Discharge Instructions (Signed)
Complementary and Alternative Medical Therapies for Diabetes Complementary and alternative medical therapies are treatments that are different from typical medical treatments (Western treatments). "Complementary" means that the therapy is used with Western treatments. "Alternative" means that the therapy is used instead of Western treatments. Are these therapies safe? Some of these therapies are usually safe. Others may be harmful. Often, there is not enough research to show how safe or effective a therapy is. If you want to try a complementary or alternative therapy, talk with your health care provider to make sure it is safe. What alternative or complementary therapies are used to treat diabetes? Acupuncture  Acupuncture is the insertion of needles into certain places on the skin. This is done by a professional. It is often used to relieve long-term (chronic) pain, especially of the bones and joints. It may help you if you have painful nerve damage. Biofeedback Biofeedback helps you to become more aware of your body's response to pain. It also helps you to learn ways of dealing with pain. Biofeedback is about relaxing and reducing stress. An example of a biofeedback technique is guided imagery. This involves creating peaceful images in your mind. Chromium Chromium is a substance that can help improve how insulin works in the body. Chromium is in many foods, including whole grains, nuts, and egg yolks. Chromium may also be taken as a supplement. Taking chromium supplements may help to control diabetes, especially if you have a lack of chromium (deficiency) in your body. However, research has not proven this. If you have kidney problems, you should be careful with chromium supplements. American ginseng American ginseng is an herb that may lower glucose levels. It may also help lower A1C levels. More research is needed before recommendations for ginseng use can be made. Magnesium Magnesium is a mineral  found in many foods, such as whole grains, nuts, and green leafy vegetables. Having low magnesium levels may make controlling blood glucose more difficult for people who have type 2 diabetes. Low magnesium levels may also contribute to certain diabetes complications. Getting more magnesium and eating a high-fiber diet may reduce the risk of developing type 2 diabetes. Vanadium Vanadium is a compound found in small amounts in certain plants and animals. Some studies show that it improves glucose levels in animals with diabetes. In one study, people with diabetes were able to lower their insulin dosage when taking vanadium. More research about side effects and safe dosage levels is needed. Cinnamon Cinnamon may decrease insulin resistance, increase insulin production, and lower blood glucose levels. It may work best when used with diabetes medicines. Fenugreek Fenugreek is an herb whose seeds are often used in cooking. It may help lower blood glucose by decreasing carbohydrate absorption and increasing insulin production. Summary  Talk with your health care provider about complementary or alternative therapy for you. Some therapies may be appropriate for you, but others may cause side effects.  Follow your diabetes care plan as prescribed. This information is not intended to replace advice given to you by your health care provider. Make sure you discuss any questions you have with your health care provider. Document Released: 11/10/2006 Document Revised: 01/16/2017 Document Reviewed: 01/30/2016 Elsevier Patient Education  2020 [employer].

## 2018-08-20 NOTE — Progress Notes (Addendum)
Subjective: patient was in status epilepticus overnight. She was initially loaded with IV Keppra 1500mg followed by Vimpat 200mg . She was also on propofol.   I reviewed LTM EEG again around 1345. There were longer periods of suppression and shorter bursts . However, she was still having frequent seizures on max dose of IV propofol.  ROS: UTO due to intubation  Exam: Vitals:   08/20/18 1100 08/20/18 1130  BP: (!) 107/52 (!) 107/48  Pulse: 95 98  Resp: (!) 30 (!) 31  Temp: 97.9 F (36.6 C) 98.4 F (36.9 C)  SpO2: 100% 98%   sedated Resp: intubated, no acute distress Abd: soft, no organomegaly  Neuro: Mental status: sedated, doesn't respond to painful stimuli CN: PERLA, oculocephalic intact, rest unable to perform due to intubation/sedation Motor: doesn't respond to noxious stimuli  Pertinent Labs: I have reviewed her labs as noted below BG ( most recent): 190 CK 2576 Lactate: 5.9 AST: 6515 ALT: 2025 T Bili: 0.5 Albumin: 2.1 BUN 82 Creat 2.7  CTH: Changes in the distribution of the left MCA consistent with subacute to chronic ischemia with mild cephalization. No acute infarct is noted.  73 year old female with history of insulin dependent DMII, HLD, MDD, recurrent falls of recent who presents to the [facility] ED after being found by EMS in her home unresponsive. She was reportedly hypotensive, covered in urine, beside a bottle of Pepcid.  CT head showed subacute to chronic infarct of the left frontal lobe.EEG showed status epilepticus  Status epilepticus - likely sec to DKA and other metabolic abnormalities - continue LTM EEG - Continue IV Keppra 750mg  BID ( avoid  increasing dose due to low GFR at this time) - Give Onfi 20mg  once  - Maximized propofol but continues to have frequent seizures. Therefore will start versed drip and titrate to burst suppression. -  Will load with IV fosphenytoin 20mg /kg once and check level in 4 hours. Pharmacy consulted to start IV phenytoin  maintenance dosing - Will discontinue vimpat as it did not significantly reduce seizure burden overnight - will defer to ICU team for other comorbidities.   This patient is critically ill and at significant risk of neurological worsening, death and care requires constant monitoring of vital signs, hemodynamics,respiratory and cardiac monitoring, neurological assessment, discussion with family, other specialists and medical decision making of high complexity. I spent 40 minutes of neurocritical care time  in the care of  this patient. This was time spent independent of any time provided by nurse practitioner or PA.  Lora Havens 08/20/2018  11:58 AM

## 2018-08-20 NOTE — Progress Notes (Signed)
Pt's family wants to continue the present course with treatment but understands severity of patient's illness. HD was mentioned by Dr. Tamala Julian and family requested that HD not be started. Will continue to monitor.

## 2018-08-21 DIAGNOSIS — J9601 Acute respiratory failure with hypoxia: Secondary | ICD-10-CM

## 2018-08-21 DIAGNOSIS — R4182 Altered mental status, unspecified: Secondary | ICD-10-CM

## 2018-08-21 DIAGNOSIS — R579 Shock, unspecified: Secondary | ICD-10-CM

## 2018-08-21 DIAGNOSIS — N179 Acute kidney failure, unspecified: Secondary | ICD-10-CM

## 2018-08-21 LAB — GLUCOSE, CAPILLARY
Glucose-Capillary: 100 mg/dL — ABNORMAL HIGH (ref 70–99)
Glucose-Capillary: 107 mg/dL — ABNORMAL HIGH (ref 70–99)
Glucose-Capillary: 108 mg/dL — ABNORMAL HIGH (ref 70–99)
Glucose-Capillary: 114 mg/dL — ABNORMAL HIGH (ref 70–99)
Glucose-Capillary: 118 mg/dL — ABNORMAL HIGH (ref 70–99)
Glucose-Capillary: 126 mg/dL — ABNORMAL HIGH (ref 70–99)
Glucose-Capillary: 133 mg/dL — ABNORMAL HIGH (ref 70–99)
Glucose-Capillary: 153 mg/dL — ABNORMAL HIGH (ref 70–99)
Glucose-Capillary: 160 mg/dL — ABNORMAL HIGH (ref 70–99)
Glucose-Capillary: 189 mg/dL — ABNORMAL HIGH (ref 70–99)
Glucose-Capillary: 195 mg/dL — ABNORMAL HIGH (ref 70–99)
Glucose-Capillary: 196 mg/dL — ABNORMAL HIGH (ref 70–99)
Glucose-Capillary: 218 mg/dL — ABNORMAL HIGH (ref 70–99)
Glucose-Capillary: 222 mg/dL — ABNORMAL HIGH (ref 70–99)
Glucose-Capillary: 225 mg/dL — ABNORMAL HIGH (ref 70–99)
Glucose-Capillary: 230 mg/dL — ABNORMAL HIGH (ref 70–99)
Glucose-Capillary: 231 mg/dL — ABNORMAL HIGH (ref 70–99)
Glucose-Capillary: 250 mg/dL — ABNORMAL HIGH (ref 70–99)
Glucose-Capillary: 259 mg/dL — ABNORMAL HIGH (ref 70–99)
Glucose-Capillary: 307 mg/dL — ABNORMAL HIGH (ref 70–99)

## 2018-08-21 LAB — BASIC METABOLIC PANEL
Anion gap: 18 — ABNORMAL HIGH (ref 5–15)
BUN: 77 mg/dL — ABNORMAL HIGH (ref 8–23)
CO2: 12 mmol/L — ABNORMAL LOW (ref 22–32)
Calcium: 5.8 mg/dL — CL (ref 8.9–10.3)
Chloride: 112 mmol/L — ABNORMAL HIGH (ref 98–111)
Creatinine, Ser: 2.61 mg/dL — ABNORMAL HIGH (ref 0.44–1.00)
GFR calc Af Amer: 21 mL/min — ABNORMAL LOW (ref >60)
GFR calc non Af Amer: 18 mL/min — ABNORMAL LOW (ref >60)
Glucose, Bld: 281 mg/dL — ABNORMAL HIGH (ref 70–99)
Potassium: 4.8 mmol/L (ref 3.5–5.1)
Sodium: 142 mmol/L (ref 135–145)

## 2018-08-21 LAB — PHENYTOIN LEVEL, TOTAL: Phenytoin Lvl: 6.8 ug/mL — ABNORMAL LOW (ref 10.0–20.0)

## 2018-08-21 LAB — MAGNESIUM
Magnesium: 1.7 mg/dL (ref 1.7–2.4)
Magnesium: 1.9 mg/dL (ref 1.7–2.4)

## 2018-08-21 LAB — HEPATIC FUNCTION PANEL
ALT: 2725 U/L — ABNORMAL HIGH (ref 0–44)
AST: 7238 U/L — ABNORMAL HIGH (ref 15–41)
Albumin: 1.4 g/dL — ABNORMAL LOW (ref 3.5–5.0)
Alkaline Phosphatase: 108 U/L (ref 38–126)
Bilirubin, Direct: 0.8 mg/dL — ABNORMAL HIGH (ref 0.0–0.2)
Indirect Bilirubin: 0.4 mg/dL (ref 0.3–0.9)
Total Bilirubin: 1.2 mg/dL (ref 0.3–1.2)
Total Protein: 3.1 g/dL — ABNORMAL LOW (ref 6.5–8.1)

## 2018-08-21 LAB — CBC
HCT: 32.5 % — ABNORMAL LOW (ref 36.0–46.0)
Hemoglobin: 11.2 g/dL — ABNORMAL LOW (ref 12.0–15.0)
MCH: 29.4 pg (ref 26.0–34.0)
MCHC: 34.5 g/dL (ref 30.0–36.0)
MCV: 85.3 fL (ref 80.0–100.0)
Platelets: DECREASED 10*3/uL (ref 150–400)
RBC: 3.81 MIL/uL — ABNORMAL LOW (ref 3.87–5.11)
RDW: 15.6 % — ABNORMAL HIGH (ref 11.5–15.5)
WBC: 23.6 10*3/uL — ABNORMAL HIGH (ref 4.0–10.5)
nRBC: 0.1 % (ref 0.0–0.2)

## 2018-08-21 LAB — PHOSPHORUS
Phosphorus: 5.8 mg/dL — ABNORMAL HIGH (ref 2.5–4.6)
Phosphorus: 4.7 mg/dL — ABNORMAL HIGH (ref 2.5–4.6)

## 2018-08-21 LAB — LACTIC ACID, PLASMA: Lactic Acid, Venous: 6.3 mmol/L (ref 0.5–1.9)

## 2018-08-21 LAB — TRIGLYCERIDES: Triglycerides: 1589 mg/dL — ABNORMAL HIGH (ref <150)

## 2018-08-21 LAB — CK: Total CK: 3220 U/L — ABNORMAL HIGH (ref 38–234)

## 2018-08-21 LAB — TROPONIN I (HIGH SENSITIVITY)
Troponin I (High Sensitivity): 177 ng/L (ref <18)
Troponin I (High Sensitivity): 177 ng/L (ref <18)

## 2018-08-21 MED ORDER — ZONISAMIDE 100 MG PO CAPS
100.0000 mg | ORAL_CAPSULE | Freq: Every day | ORAL | Status: DC
  Administered 2018-08-21 – 2018-08-22 (×2): 100 mg via ORAL
  Filled 2018-08-21 (×4): qty 1

## 2018-08-21 MED ORDER — CALCIUM GLUCONATE-NACL 2-0.675 GM/100ML-% IV SOLN
2.0000 g | Freq: Once | INTRAVENOUS | Status: AC
  Administered 2018-08-21: 11:00:00 2000 mg via INTRAVENOUS
  Filled 2018-08-21: qty 100

## 2018-08-21 MED ORDER — MIDAZOLAM BOLUS VIA INFUSION
10.0000 mg | Freq: Once | INTRAVENOUS | Status: AC
  Administered 2018-08-21: 04:00:00 10 mg via INTRAVENOUS
  Filled 2018-08-21: qty 10

## 2018-08-21 MED ORDER — SODIUM CHLORIDE 0.9 % IV SOLN
INTRAVENOUS | Status: DC
  Administered 2018-08-21 (×2): via INTRAVENOUS
  Filled 2018-08-21 (×11): qty 80

## 2018-08-21 MED ORDER — INSULIN ASPART 100 UNIT/ML ~~LOC~~ SOLN
0.0000 [IU] | SUBCUTANEOUS | Status: DC
  Administered 2018-08-21: 15 [IU] via SUBCUTANEOUS

## 2018-08-21 MED ORDER — MIDAZOLAM HCL-SODIUM CHLORIDE 100-0.8 MG/100ML-% IV SOLN: 100.0000 mg | INTRAVENOUS | Status: DC

## 2018-08-21 MED ORDER — SODIUM CHLORIDE 0.9 % IV SOLN: 10.0000 mg/kg | INTRAVENOUS | Status: DC

## 2018-08-21 MED ORDER — "THROMBI-PAD 3""X3"" EX PADS"
1.0000 | MEDICATED_PAD | Freq: Once | CUTANEOUS | Status: DC
  Filled 2018-08-21: qty 1

## 2018-08-21 MED ORDER — MIDAZOLAM HCL-SODIUM CHLORIDE 100-0.9 MG/100ML-% IV SOLN: 100.0000 mg | INTRAVENOUS | Status: DC

## 2018-08-21 MED ORDER — INSULIN ASPART 100 UNIT/ML ~~LOC~~ SOLN: 0.0000 [IU] | Freq: Three times a day (TID) | SUBCUTANEOUS | Status: DC

## 2018-08-21 MED ORDER — SODIUM CHLORIDE 0.9 % IV SOLN
INTRAVENOUS | Status: DC
  Filled 2018-08-21: qty 100

## 2018-08-21 MED ORDER — PHENYLEPHRINE HCL-NACL 20-0.9 MG/250ML-% IV SOLN
0.0000 ug/min | INTRAVENOUS | Status: DC
  Administered 2018-08-21: 11:00:00 75 ug/min via INTRAVENOUS
  Administered 2018-08-21: 22:00:00 82 ug/min via INTRAVENOUS
  Administered 2018-08-21: 16:00:00 75.067 ug/min via INTRAVENOUS
  Administered 2018-08-22: 15:00:00 60 ug/min via INTRAVENOUS
  Administered 2018-08-22: 70 ug/min via INTRAVENOUS
  Administered 2018-08-22: 09:00:00 60 ug/min via INTRAVENOUS
  Administered 2018-08-22: 02:00:00 67 ug/min via INTRAVENOUS
  Administered 2018-08-23: 12:00:00 400 ug/min via INTRAVENOUS
  Administered 2018-08-23: 02:00:00 70 ug/min via INTRAVENOUS
  Administered 2018-08-23: 10:00:00 150 ug/min via INTRAVENOUS
  Filled 2018-08-21 (×13): qty 250

## 2018-08-21 MED ORDER — INSULIN GLARGINE 100 UNIT/ML ~~LOC~~ SOLN
10.0000 [IU] | SUBCUTANEOUS | Status: DC
  Administered 2018-08-22: 01:00:00 10 [IU] via SUBCUTANEOUS
  Filled 2018-08-21 (×2): qty 0.1

## 2018-08-21 MED ORDER — SODIUM CHLORIDE 0.9 % IV SOLN
40.0000 mg/h | INTRAVENOUS | Status: DC
  Administered 2018-08-22 – 2018-08-23 (×6): 40 mg/h via INTRAVENOUS
  Filled 2018-08-21 (×8): qty 50

## 2018-08-21 MED ORDER — MIDAZOLAM BOLUS VIA INFUSION
5.0000 mg | Freq: Once | INTRAVENOUS | Status: AC
  Administered 2018-08-21: 02:00:00 5 mg via INTRAVENOUS
  Filled 2018-08-21: qty 5

## 2018-08-21 MED ORDER — INSULIN REGULAR(HUMAN) IN NACL 100-0.9 UT/100ML-% IV SOLN
INTRAVENOUS | Status: DC
  Administered 2018-08-21: 06:00:00 2 [IU]/h via INTRAVENOUS
  Administered 2018-08-21: 19:00:00 2.4 [IU]/h via INTRAVENOUS
  Filled 2018-08-21 (×2): qty 100

## 2018-08-21 MED ORDER — MIDAZOLAM HCL-SODIUM CHLORIDE 100-0.9 MG/100ML-% IV SOLN: 40.0000 mg/h | INTRAVENOUS | Status: DC

## 2018-08-21 MED ORDER — NOREPINEPHRINE 16 MG/250ML-% IV SOLN
0.0000 ug/min | INTRAVENOUS | Status: DC
  Administered 2018-08-21: 21:00:00 38 ug/min via INTRAVENOUS
  Administered 2018-08-21: 40 ug/min via INTRAVENOUS
  Administered 2018-08-22 (×2): 37 ug/min via INTRAVENOUS
  Administered 2018-08-22 – 2018-08-23 (×2): 38 ug/min via INTRAVENOUS
  Administered 2018-08-23: 10:00:00 40 ug/min via INTRAVENOUS
  Filled 2018-08-21 (×8): qty 250

## 2018-08-21 MED ORDER — CLOBAZAM 10 MG PO TABS
20.0000 mg | ORAL_TABLET | Freq: Every day | ORAL | Status: DC
  Administered 2018-08-21 – 2018-08-22 (×2): 20 mg via ORAL
  Filled 2018-08-21 (×2): qty 2

## 2018-08-21 MED ORDER — MIDAZOLAM BOLUS VIA INFUSION
10.0000 mg | Freq: Once | INTRAVENOUS | Status: AC
  Administered 2018-08-21: 20:00:00 10 mg via INTRAVENOUS
  Filled 2018-08-21: qty 10

## 2018-08-21 MED ORDER — INSULIN ASPART 100 UNIT/ML ~~LOC~~ SOLN
0.0000 [IU] | SUBCUTANEOUS | Status: DC
  Administered 2018-08-23 (×2): 1 [IU] via SUBCUTANEOUS

## 2018-08-21 MED ORDER — SODIUM CHLORIDE 0.9 % IV SOLN
500.0000 mg | INTRAVENOUS | Status: AC
  Administered 2018-08-21: 23:00:00 500 mg via INTRAVENOUS
  Filled 2018-08-21: qty 10

## 2018-08-21 MED ORDER — CHLORHEXIDINE GLUCONATE 0.12 % MT SOLN
OROMUCOSAL | Status: AC
  Filled 2018-08-21: qty 15

## 2018-08-21 NOTE — Progress Notes (Signed)
EEG reviewed from 730am till 1608. 41 seizures arising from arising from right frontotemporal region with no clear clinical signs between 1047am to  1608 . Last seizure at 1541. Neurology provider was notified.   Romin Divita Barbra Sarks

## 2018-08-21 NOTE — Progress Notes (Signed)
PULMONARY / CRITICAL CARE MEDICINE   NAME:  Yvonne Reynolds, MRN:  [id number], DOB:  [birth year], LOS: 2 ADMISSION DATE:  07/30/2018, CONSULTATION DATE:  08/15/2018 REFERRING MD:  ED, Dr. Ralene Bathe, CHIEF COMPLAINT:  AMS   BRIEF HISTORY:    Yvonne Reynolds is a 73 y.o. female who presented to the ED via EMS and was placed on vent on arrival.    HISTORY OF PRESENT ILLNESS   Patient was last known well at 3 PM on 08/18/2018.  Per EMS report, she was unresponsive in a very hot home with a bottle of Pepcid near her.  She was covered in urine.  Neighbor heard her screaming at her dog yesterday at 3 PM.  The following medical history is taken from recent clinic notes. Patient's past medical history is significant for IDDM complicated by retinopathy and nephropathy and has been on insulin since 1999.  Her endocrinology notes at low [facility], patient has never been in DKA, though has had a severe hypoglycemic episode in 2017).  Patient's outpatient visit notes have our revealing of her recent medical history.  In May 2020, she was seen by Dr. Loanne Drilling (endocrinologist) where patient had reported that she stopped taking all of her medications due to depression.  He also emergently sent her to Dr. Charlton Amor office as patient reported frequent falls. For her depression, patient takes Effexor 150 mg daily.  She lives by herself and reports that her family has not been visiting her due to [place].  Patient also has hypertension that was previously controlled on medication however, patient has not been compliant with medications. SIGNIFICANT PAST MEDICAL HISTORY   Depression GERD Dyslipidemia Seizure due to hypoglycemia Hypertension, poor recent medication compliance IDDM, complicated by retinopathy and nephropathy on insulin since 1999 Macular degeneration Nocturia Osteoarthritis  SIGNIFICANT EVENTS:  7/23 >> admitted & Intubated   STUDIES:   CXR >> ET tube in place.  No airspace disease. CT Head >> no acute  intracranial abnormality.  Ischemic and atrophic changes noted that are subacute to chronic. EEG >> epileptogenicity in left fronto-parietal region as well as moderate diffuse encephalopathy. No seizures were seen throughout the recording.  CULTURES:  7/23 Blood culture x2 >>  7/23 urine culture >>   ANTIBIOTICS:  Cefepime 2g 7/23 >>  Flagyl 500 mg 7/23 >> 7/24  Vancomycin 7/23 >>7/23  LINES/TUBES:   Patient Lines/Drains/Airways Status   Active Line/Drains/Airways    Name:   Placement date:   Placement time:   Site:   Days:   Peripheral IV 07/31/2018 Right Hand   08/06/2018    1130    Hand   less than 1   Peripheral IV 07/29/2018 Left Antecubital   08/14/2018    1149    Antecubital   less than 1   Peripheral IV 08/13/2018 Left Forearm   07/31/2018    1149    Forearm   less than 1   Peripheral IV 08/22/2018 Right Forearm   08/18/2018    1204    Forearm   less than 1   Urethral Catheter Yvonne Reynolds, NT Temperature probe 10 Fr.   08/26/2018    1220    Temperature probe   less than 1   Airway 7 mm   08/07/2018    1155     less than 1   Incision 07/23/12 Ankle Left   07/23/12    2135     2218   Incision (Closed) 10/17/14 Chest Other (Comment)   10/17/14  0825     1402          CONSULTANTS:  Neurology   Subjective: Progressive hypotension overnight Hypothermic overnight as awell  OBJECTIVE   Temp:  [95.5 F (35.3 C)-99.9 F (37.7 C)] 95.5 F (35.3 C) (07/25 0900) Pulse Rate:  [70-150] 70 (07/25 0900) Resp:  [25-31] 25 (07/25 0900) BP: (81-148)/(44-107) 116/48 (07/25 0900) SpO2:  [89 %-100 %] 92 % (07/25 0900) Arterial Line BP: (80-154)/(38-70) 116/40 (07/25 0900) FiO2 (%):  [30 %] 30 % (07/25 0829) Weight:  [77.7 kg] 77.7 kg (07/25 0500)   Vent Mode: PRVC FiO2 (%):  [30 %] 30 % Set Rate:  [25 bmp] 25 bmp Vt Set:  [380 mL] 380 mL PEEP:  [5 cmH20] 5 cmH20 Plateau Pressure:  [14 cmH20-18 cmH20] 14 cmH20  PHYSICAL EXAM: General: Chronically ill appearing female, NAD, sedate HEENT:  [place]/AT, PERRL but sluggish, EOM spontaneous and MMM, ETT in place Cardiovascular: RRR, Nl S1/S2 and -M/R/G Lungs: CTA bilaterally Abdomen: Soft, NT, ND and +BS Musculoskeletal: -edema and -tenderness Skin: Soft, pink, warm. Extremities: Right tibial IO access  Neuro: Completely sedate, not following any commands  I reviewed CXR myself, ETT is in a good position  ASSESSMENT AND PLAN   Multiorgan failure. Liver, acute transaminitis in the thousands Renal, AKI, oliguria Neuro, seizures Pulmonary, respiratory failure   Seizure activity   For sedation, should try benzodiasepene in setting of seizures. Versed is poor choice in patient's renal and hepatic function. Neurology on board. EEG in ED with left fronto-parietal epileptic activity. Currently with video EEg. Patient loaded with keppra, increased vimpat. Additionally started Onfi.   Appreciate neurology recommendations, will allow for another 24 hours  D/C propofol  Increase versed  Burst suppression for another 24 hours  Follow-up EEG per neuro  Non elevated AGMA Patient with elevated anion gap acidosis yesterday, most likely exacerbated by acidosis from diabetes.  Now non elevated AGMA, Beta hydroxybutyrate has now resolved.  Chloride is within normal limits. Patient has been on LRs that could falsely elevate her lactate on labs. There is no anion gap and remainder of electrolytes remain normal. Her kidneys are not functioning at this time  - Continue bicarb   Transaminitis  Acute elevations in ALT AST overnight.  Most likely due to ischemia given patient's presentation and reperfusion injury. Possibly due to seizure medication? 500 BID keppra given her renal and hepatic status. Amylase is also elevated; however, bilirubin wnl.  No further work up at this time. Can consider RUQ U/S or further imaging if other concerns.   HHS/DKA, resolved  BHB is 0.2 this AM form 7.9 yesterday. Anion gap normal x 2.  Despite this, will  continue insulin drip at this time as patients condition is unstable.   Rhabdo CK elevated  - Continue bicarb - CPK in AM - Monitor I/O - Hold off renal consult for now  AKI with oliguria Patient has not made any urine overnight.  Despite heavy fluid resuscitation.  Consult nephrology for CRRT if patient becomes volume overloaded   Acute Respiratory Failure Likely due to acidosis.  Patient is currently on propofol drip, Levophed 11 mcg/min.  O2 Sats > 92%  Wean as possible   Pulmonary hygiene   PAD  Full vent support, hold off weaning for now  Bacteruria UA with few yeast and bacteria, the patient is unable to tell us whether she is symptomatic. Empiric antibiotic started in the ED, vancomycin, cefepime, Flagyl. Blood cultures with no growth and  no fever.   F/u urine and bld cx  Continue Cefepime (renally dosed)   IDDM At home, patient is on 65 units of Novolin daily and 10 units of NPH at bedtime.  Patient has history of poor medication compliance.  Per progress notes, financial issues prevent patient from obtaining diabetic medications that would be easier for this patient to manage.  Diabetes coordinator  Management as above  Depression Patient on venlafaxine XR 150 mg daily with breakfast. Decrease to 75 daily 2/2 renal function.   While NPO, will hold at this time.   HS Troponin  Elevated at 56-63.  Likely demand ischemia given patient's current status.  No consult to cardiology at this time.  HTN At home 10 mg lisinopril daily.  No medications at the time given hypotension.  Discussed with bedside RN  Best Practice / Goals of Care / Disposition.   DVT PROPHYLAXIS:Low dose lovenox  SUP: Yes NUTRITION: NPO, nutrition consulted  MOBILITY: BR GOALS OF CARE: not  FAMILY DISCUSSIONS: Yvonne Reynolds [phone] DISPOSITION ICU   The patient is critically ill with multiple organ systems failure and requires high complexity decision making for assessment and  support, frequent evaluation and titration of therapies, application of advanced monitoring technologies and extensive interpretation of multiple databases.   Critical Care Time devoted to patient care services described in this note is  33  Minutes. This time reflects time of care of this signee Dr Jennet Maduro. This critical care time does not reflect procedure time, or teaching time or supervisory time of PA/NP/Med student/Med Resident etc but could involve care discussion time.  Rush Farmer, M.D. [facility] Pulmonary/Critical Care Medicine. Pager: [phone]. After hours pager: [phone].

## 2018-08-21 NOTE — Progress Notes (Signed)
eLink Physician-Brief Progress Note Patient Name: Yvonne Reynolds DOB: [birth year] MRN: [id number]   Date of Service  08/21/2018  HPI/Events of Note  Hyperglycemia  eICU Interventions  ICU glycemic control insulin orders entered        Frederik Pear 08/21/2018, 4:34 AM

## 2018-08-21 NOTE — Progress Notes (Signed)
Patient continues to have intermittent seizures, phenytoin level of 6.8 corrects to 11.6, would like to push near 20. Will give additional 500mg  IV fosphenytoin, repeat level in the am. Continue to monitor for seizures, increase versed as needed.   Roland Rack, MD [facility] [phone]  If 7pm- 7am, please page neurology on call as listed in [place].

## 2018-08-21 NOTE — Procedures (Addendum)
Patient Name: Yvonne Reynolds  MRN: [id number]  Epilepsy Attending: Lora Havens  Referring Physician/Provider: Dr Merrilyn Puma Duration : 08/20/2018 730am to 08/21/2018 730am  Patient history: 73yo female with prior left frontal stroke who presented with ams and right arm posturing concerning for sz. EEG ordered to evaluate.  Technical Details:Long-term video-EEG monitoring was performed using standard setting per the guidelines. Briefly, a minimum of 21 electrodes were placed on scalp according to the International 10-20 or/and 10-10 Systems. Supplemental electrodes were placed as needed. Single EKG electrode was also used to detect cardiac arrhythmia. Patient's behavior was continuously recorded on video simultaneously with EEG. A minimum of 16 channels were used for data display. Each epoch of study was reviewed manually daily and as needed using standard referential and bipolar montages.   EEG Description:  Background: EEG showed burst suppression seen as high amplitude delta slowing lasting 1-2 seconds followed by 2-4 seconds of background suppression till 1431 after which EEG showed near continuous background suppression.  Interictal: Frequent spikes and sharp waves were seen in right frontotemporal region more than left fronto-parietal region which became rare after 1431.  Ictal: Total 36 seizures arising from right frontotemporal region, lasting from 30-60 seconds on an average with no clear clinical signs were recorded between 01/20/2019 730am to 1431pm. Last seizure was on 08/21/2018 at 141 am.    Impression: This  EEG initially showed patient was in status epilepticus with 36 seizures from 08/20/2018 730 am to 1431pm, arising from right frontotemporal region, each lasting 30-60 seconds with no clear clinical signs. Last seizure was on 08/21/2018 at 0141 am. In addition, there is evidence of profound encephalopathy.    Amani Marseille Barbra Sarks

## 2018-08-21 NOTE — Progress Notes (Addendum)
Reason for consult: Status epilepticus  Subjective: Patient remains intubated and on propofol and Versed.  Objective: Multiple brief electrographic seizures throughout the night as well as today.  Propofol was discontinued due to triglycerides, and patient now only on Versed drip.   ROS: Unable to obtain due to poor mental status  Examination  Vital signs in last 24 hours: Temp:  [95.5 F (35.3 C)-99.3 F (37.4 C)] 99.3 F (37.4 C) (07/25 1500) Pulse Rate:  [70-134] 91 (07/25 1515) Resp:  [22-29] 25 (07/25 1515) BP: (81-138)/(43-107) 120/43 (07/25 1515) SpO2:  [89 %-95 %] 92 % (07/25 1515) Arterial Line BP: (80-128)/(38-70) 113/43 (07/25 1500) FiO2 (%):  [30 %] 30 % (07/25 1516) Weight:  [77.7 kg] 77.7 kg (07/25 0500)  General: lying in bed, intubated CVS: pulse-normal rate and rhythm RS: breathing comfortably with assistance of ventilator Extremities: normal   Neuro: Mental Status: Patient does not respond to verbal stimuli.  Does not respond to deep sternal rub.  Does not follow commands.  No verbalizations noted.  Cranial Nerves:  Pupils are round, equal nonreactive, doll's response absent bilaterally,  gag reflex absent  Motor: Extremities flaccid throughout.  No spontaneous movement noted.  No purposeful movements noted.  Sensory: Does not respond to noxious stimuli in any extremity. Deep Tendon Reflexes:  Absent throughout. Plantars: mute  Cerebellar: Unable to perform  Basic Metabolic Panel: Recent Labs  Lab 08/14/2018 2020 08/20/18 0006 08/20/18 0435 08/20/18 1406 08/20/18 1416 08/21/18 0509  NA 142 144 144 147* 148* 142  K 4.1 3.7 3.9 4.1 3.9 4.8  CL 115* 119* 120* 123*  --  112*  CO2 11* 13* 12* 13*  --  12*  GLUCOSE 641* 493* 364* 122*  --  281*  BUN 82* 83* 82* 77*  --  77*  CREATININE 2.99* 2.89* 2.74* 2.53*  --  2.61*  CALCIUM 6.9* 7.0* 7.0* 6.4*  --  5.8*  MG 2.3  --  2.2 1.8  --  1.7  PHOS 4.6  --  2.6 2.5  --  5.8*    CBC: Recent Labs   Lab 08/08/2018 1128  08/20/18 0006 08/20/18 0435 08/20/18 1416 08/20/18 1543 08/21/18 0509  WBC 14.1*  --  20.5* 19.9*  --  21.5* 23.6*  NEUTROABS 11.6*  --   --  16.8*  --   --   --   HGB 10.2*   < > 12.0 12.1 11.2* 10.1* 11.2*  HCT 35.0*   < > 37.9 39.2 33.0* 31.4* 32.5*  MCV 91.6  --  85.7 87.1  --  84.6 85.3  PLT 268  --  168 178  --  PLATELET CLUMPS NOTED ON SMEAR, COUNT APPEARS DECREASED PLATELET CLUMPS NOTED ON SMEAR, COUNT APPEARS DECREASED   < > = values in this interval not displayed.     Coagulation Studies: Recent Labs    08/04/2018 1231  LABPROT 18.6*  INR 1.6*    EEG Description: Background: EEG showed burst suppression seen as high amplitude delta slowing lasting 1-2 seconds followed by 2-4 seconds of background suppression till 1431 after which EEG showed near continuous background suppression.  Interictal: Frequent spikes and sharp waves were seen in right frontotemporal region more than left fronto-parietal region which became rare after 1431.  Ictal: Total 36 seizuresarising from right frontotemporal region, lasting from 30-60 seconds on an average with no clear clinical signs were recorded between 01/20/2019 730am to 1431pm. Last seizure was on 08/21/2018 at 141 am.    Impression:  ThisEEG initially showed patient was in status epilepticus with 36 seizures from 08/20/2018 730 am to 1431pm,arising from right frontotemporal region, each lasting 30-60 seconds with no clear clinical signs. Last seizure was on 08/21/2018 at 0141 am. In addition, there is evidence of profound encephalopathy.     ASSESSMENT AND PLAN  73 year old female with history of diabetes mellitus, hyperlipidemia, major depression, recurrent falls presents to the ED after being found unresponsive.  Patient on arrival is in DKA, hypotensive, and noted to have left gaze deviation.  Patient intubated for airway protection and EEG showed status epilepticus.  CTA showed subacute to chronic  infarct in the left frontal lobe.  Refractory Status epilepticus Subacute to Chronic Stroke Diabetic Ketoacidosis  -Continuous EEG-mostly suppressed with multiple brief seizures from 11 AM onwards -Increase Versed, will titrate based on EEG -Continue Dilantin, will recheck level today -Continue Keppra 750 mg twice daily, renal dosing -Continue Zonegran 100 mg nightly -Continue clobazam 20 mg daily -Continue to treat metabolic conditions and infection -Avoid cefepime  Other medical conditions per CCM -Acute hypoxic respiratory failure -Transaminitis -Rhabdomyolysis -Acute kidney injury -Diabetic ketoacidosis   The patient is critically ill due to status epilepticus and is a high risk for neurological deterioration from worsening seizures, respiratory failure, cardiac death, complications of intubation and ICU stay, adverse effects of multiple anesthetic agents and antiepileptic agents.  Patient care includes monitoring of vital signs, reviewing labs, examination, reviewing long-term EEG and discussion with epileptologist and critical care services, titration of drips.  I spent 40 minutes of neuro critical care time with this patient.   Karena Addison Aroor [facility] Pager Number [phone] For questions after 7pm please refer to AMION to reach the Neurologist on call

## 2018-08-22 ENCOUNTER — Inpatient Hospital Stay (HOSPITAL_COMMUNITY): Payer: Medicare HMO

## 2018-08-22 DIAGNOSIS — E1311 Other specified diabetes mellitus with ketoacidosis with coma: Secondary | ICD-10-CM

## 2018-08-22 LAB — POCT I-STAT 7, (LYTES, BLD GAS, ICA,H+H)
Acid-base deficit: 10 mmol/L — ABNORMAL HIGH (ref 0.0–2.0)
Bicarbonate: 15.4 mmol/L — ABNORMAL LOW (ref 20.0–28.0)
Calcium, Ion: 0.82 mmol/L — CL (ref 1.15–1.40)
HCT: 34 % — ABNORMAL LOW (ref 36.0–46.0)
Hemoglobin: 11.6 g/dL — ABNORMAL LOW (ref 12.0–15.0)
O2 Saturation: 89 %
Patient temperature: 35.7
Potassium: 3.5 mmol/L (ref 3.5–5.1)
Sodium: 144 mmol/L (ref 135–145)
TCO2: 16 mmol/L — ABNORMAL LOW (ref 22–32)
pCO2 arterial: 30.5 mmHg — ABNORMAL LOW (ref 32.0–48.0)
pH, Arterial: 7.305 — ABNORMAL LOW (ref 7.350–7.450)
pO2, Arterial: 56 mmHg — ABNORMAL LOW (ref 83.0–108.0)

## 2018-08-22 LAB — GLUCOSE, CAPILLARY
Glucose-Capillary: 105 mg/dL — ABNORMAL HIGH (ref 70–99)
Glucose-Capillary: 106 mg/dL — ABNORMAL HIGH (ref 70–99)
Glucose-Capillary: 108 mg/dL — ABNORMAL HIGH (ref 70–99)
Glucose-Capillary: 167 mg/dL — ABNORMAL HIGH (ref 70–99)
Glucose-Capillary: 57 mg/dL — ABNORMAL LOW (ref 70–99)
Glucose-Capillary: 76 mg/dL (ref 70–99)
Glucose-Capillary: 90 mg/dL (ref 70–99)
Glucose-Capillary: 96 mg/dL (ref 70–99)
Glucose-Capillary: 98 mg/dL (ref 70–99)

## 2018-08-22 LAB — CBC
HCT: 30 % — ABNORMAL LOW (ref 36.0–46.0)
Hemoglobin: 9.6 g/dL — ABNORMAL LOW (ref 12.0–15.0)
MCH: 26.7 pg (ref 26.0–34.0)
MCHC: 32 g/dL (ref 30.0–36.0)
MCV: 83.6 fL (ref 80.0–100.0)
Platelets: 82 10*3/uL — ABNORMAL LOW (ref 150–400)
RBC: 3.59 MIL/uL — ABNORMAL LOW (ref 3.87–5.11)
RDW: 15.6 % — ABNORMAL HIGH (ref 11.5–15.5)
WBC: 20.8 10*3/uL — ABNORMAL HIGH (ref 4.0–10.5)
nRBC: 1.2 % — ABNORMAL HIGH (ref 0.0–0.2)

## 2018-08-22 LAB — BASIC METABOLIC PANEL
Anion gap: 19 — ABNORMAL HIGH (ref 5–15)
BUN: 76 mg/dL — ABNORMAL HIGH (ref 8–23)
CO2: 16 mmol/L — ABNORMAL LOW (ref 22–32)
Calcium: 5.3 mg/dL — CL (ref 8.9–10.3)
Chloride: 109 mmol/L (ref 98–111)
Creatinine, Ser: 2.9 mg/dL — ABNORMAL HIGH (ref 0.44–1.00)
GFR calc Af Amer: 18 mL/min — ABNORMAL LOW (ref >60)
GFR calc non Af Amer: 16 mL/min — ABNORMAL LOW (ref >60)
Glucose, Bld: 116 mg/dL — ABNORMAL HIGH (ref 70–99)
Potassium: 3.9 mmol/L (ref 3.5–5.1)
Sodium: 144 mmol/L (ref 135–145)

## 2018-08-22 LAB — PHENYTOIN LEVEL, TOTAL: Phenytoin Lvl: 9.3 ug/mL — ABNORMAL LOW (ref 10.0–20.0)

## 2018-08-22 LAB — CK: Total CK: 2414 U/L — ABNORMAL HIGH (ref 38–234)

## 2018-08-22 LAB — MAGNESIUM: Magnesium: 1.6 mg/dL — ABNORMAL LOW (ref 1.7–2.4)

## 2018-08-22 LAB — PHOSPHORUS: Phosphorus: 6.8 mg/dL — ABNORMAL HIGH (ref 2.5–4.6)

## 2018-08-22 MED ORDER — PHENOBARBITAL 32.4 MG PO TABS
64.8000 mg | ORAL_TABLET | Freq: Two times a day (BID) | ORAL | Status: DC
  Filled 2018-08-22: qty 2

## 2018-08-22 MED ORDER — DEXTROSE 50 % IV SOLN
50.0000 mL | Freq: Once | INTRAVENOUS | Status: AC
  Administered 2018-08-22: 50 mL via INTRAVENOUS

## 2018-08-22 MED ORDER — PHENOBARBITAL 60 MG PO TABS
60.0000 mg | ORAL_TABLET | Freq: Two times a day (BID) | ORAL | Status: DC
  Filled 2018-08-22: qty 1

## 2018-08-22 MED ORDER — CALCIUM GLUCONATE-NACL 2-0.675 GM/100ML-% IV SOLN
2.0000 g | Freq: Once | INTRAVENOUS | Status: DC
  Filled 2018-08-22: qty 100

## 2018-08-22 MED ORDER — PHENOBARBITAL 64.8 MG PO TABS: 64.8000 mg | ORAL_TABLET | Freq: Two times a day (BID) | ORAL | Status: DC

## 2018-08-22 MED ORDER — PHENOBARBITAL SODIUM 65 MG/ML IJ SOLN
65.0000 mg | Freq: Two times a day (BID) | INTRAMUSCULAR | Status: DC
  Administered 2018-08-22 – 2018-08-23 (×3): 65 mg via INTRAVENOUS
  Filled 2018-08-22 (×3): qty 1

## 2018-08-22 MED ORDER — DEXTROSE 50 % IV SOLN
INTRAVENOUS | Status: AC
  Filled 2018-08-22: qty 50

## 2018-08-22 NOTE — Progress Notes (Signed)
PULMONARY / CRITICAL CARE MEDICINE   NAME:  Yvonne Reynolds, MRN:  [id number], DOB:  Jan 17, 1946, LOS: 3 ADMISSION DATE:  08/02/2018, CONSULTATION DATE:  08/17/2018 REFERRING MD:  ED, Dr. Ralene Bathe, CHIEF COMPLAINT:  AMS   BRIEF HISTORY:    Yvonne Reynolds is a 73 y.o. female who presented to the ED via EMS and was placed on vent on arrival.    HISTORY OF PRESENT ILLNESS   Patient was last known well at 3 PM on 08/18/2018.  Per EMS report, she was unresponsive in a very hot home with a bottle of Pepcid near her.  She was covered in urine.  Neighbor heard her screaming at her dog yesterday at 3 PM.  The following medical history is taken from recent clinic notes. Patient's past medical history is significant for IDDM complicated by retinopathy and nephropathy and has been on insulin since 1999.  Her endocrinology notes at [facility], patient has never been in DKA, though has had a severe hypoglycemic episode in 2017).  Patient's outpatient visit notes have our revealing of her recent medical history.  In May 2020, she was seen by Dr. Loanne Drilling (endocrinologist) where patient had reported that she stopped taking all of her medications due to depression.  He also emergently sent her to Dr. Charlton Amor office as patient reported frequent falls. For her depression, patient takes Effexor 150 mg daily.  She lives by herself and reports that her family has not been visiting her due to [place].  Patient also has hypertension that was previously controlled on medication however, patient has not been compliant with medications. SIGNIFICANT PAST MEDICAL HISTORY   Depression GERD Dyslipidemia Seizure due to hypoglycemia Hypertension, poor recent medication compliance IDDM, complicated by retinopathy and nephropathy on insulin since 1999 Macular degeneration Nocturia Osteoarthritis  SIGNIFICANT EVENTS:  7/23 >> admitted & Intubated   STUDIES:   CXR >> ET tube in place.  No airspace disease. CT Head >> no acute  intracranial abnormality.  Ischemic and atrophic changes noted that are subacute to chronic. EEG >> epileptogenicity in left fronto-parietal region as well as moderate diffuse encephalopathy. No seizures were seen throughout the recording.  CULTURES:  7/23 Blood culture x2 >>  7/23 urine culture >>   ANTIBIOTICS:  Cefepime 2g 7/23 >>  Flagyl 500 mg 7/23 >> 7/24  Vancomycin 7/23 >>7/23  LINES/TUBES:   Patient Lines/Drains/Airways Status   Active Line/Drains/Airways    Name:   Placement date:   Placement time:   Site:   Days:   Peripheral IV 07/28/2018 Right Hand   08/15/2018    1130    Hand   less than 1   Peripheral IV 08/16/2018 Left Antecubital   08/26/2018    1149    Antecubital   less than 1   Peripheral IV 07/29/2018 Left Forearm   08/24/2018    1149    Forearm   less than 1   Peripheral IV 08/02/2018 Right Forearm   08/25/2018    1204    Forearm   less than 1   Urethral Catheter Camryn Cogan, NT Temperature probe 10 Fr.   08/22/2018    1220    Temperature probe   less than 1   Airway 7 mm   07/30/2018    1155     less than 1   Incision 07/23/12 Ankle Left   07/23/12    2135     2218   Incision (Closed) 10/17/14 Chest Other (Comment)   10/17/14  0825     1402          CONSULTANTS:  Neurology   Subjective: Profound hypotension overnight Remains in a medically induced coma for seizure control  OBJECTIVE   Temp:  [96.3 F (35.7 C)-100 F (37.8 C)] 97.7 F (36.5 C) (07/26 0800) Pulse Rate:  [77-93] 81 (07/26 0800) Resp:  [18-91] 26 (07/26 0800) BP: (94-143)/(42-55) 124/49 (07/26 0800) SpO2:  [89 %-98 %] 90 % (07/26 0800) Arterial Line BP: (67-171)/(35-49) 117/38 (07/26 0800) FiO2 (%):  [30 %-35 %] 35 % (07/26 0800) Weight:  [86.8 kg] 86.8 kg (07/26 0326)   Vent Mode: PRVC FiO2 (%):  [30 %-35 %] 35 % Set Rate:  [25 bmp] 25 bmp Vt Set:  [380 mL] 380 mL PEEP:  [5 cmH20] 5 cmH20 Plateau Pressure:  [9 cmH20-17 cmH20] 17 cmH20  PHYSICAL EXAM: General: Chronically ill appearing  female, NAD HEENT: [place]/AT, PERRL, EOM-I and MMM, ETT in place Cardiovascular: RRR, Nl S1/S2 and -M/R/G Lungs: CTA bilaterally Abdomen: Soft, NT, ND and +BS Musculoskeletal: -edema and -tenderness Skin: Soft, pink, warm. Extremities: Right tibial IO access  Neuro: Completely sedate, not following any commands  I reviewed CXR myself, ETT is in a good position  ASSESSMENT AND PLAN   Multiorgan failure. Liver, acute transaminitis in the thousands Renal, AKI, oliguria Neuro, seizures Pulmonary, respiratory failure   Seizure activity   For sedation, should try benzodiasepene in setting of seizures. Versed is poor choice in patient's renal and hepatic function. Neurology on board. EEG in ED with left fronto-parietal epileptic activity. Currently with video EEg. Patient loaded with keppra, increased vimpat. Additionally started Onfi.   Appreciate neurology recommendations, will allow for another 24-48 hours  Versed up to 40 mg/hr GGT  Burst suppression for another 24-48 hours  Follow-up EEG per neuro  Non elevated AGMA Patient with elevated anion gap acidosis yesterday, most likely exacerbated by acidosis from diabetes.  Now non elevated AGMA, Beta hydroxybutyrate has now resolved.  Chloride is within normal limits. Patient has been on LRs that could falsely elevate her lactate on labs. There is no anion gap and remainder of electrolytes remain normal. Her kidneys are not functioning at this time  - Will continue bicarb for now - IVF as ordered - Will start TF  Transaminitis  Acute elevations in ALT AST overnight.  Most likely due to ischemia given patient's presentation and reperfusion injury. Possibly due to seizure medication? 500 BID keppra given her renal and hepatic status. Amylase is also elevated; however, bilirubin wnl.  No further work up at this time. Can consider RUQ U/S or further imaging if other concerns.   HHS/DKA, resolved  BHB is 0.2 this AM form 7.9 yesterday.  Anion gap normal x 2.  Despite this, will continue insulin drip at this time as patients condition is unstable.   Rhabdo CK elevated  - Continue bicarb - CPK in AM - Monitor I/O  AKI with oliguria Patient has not made any urine overnight.  Despite heavy fluid resuscitation. Patient did not wish for dialysis, so will not consult renal but continue hydration with LR  Acute Respiratory Failure Likely due to acidosis.  Patient is currently on propofol drip, Levophed 11 mcg/min.  O2 Sats > 92%  Wean as possible   Pulmonary hygiene   PAD  Full vent support, hold off weaning for now given sedation  Bacteruria UA with few yeast and bacteria, the patient is unable to tell us whether she is symptomatic. Empiric  antibiotic started in the ED, vancomycin, cefepime, Flagyl. Blood cultures with no growth and no fever.   F/u urine and bld cx  Continue Cefepime (renally dosed)   IDDM At home, patient is on 65 units of Novolin daily and 10 units of NPH at bedtime.  Patient has history of poor medication compliance.  Per progress notes, financial issues prevent patient from obtaining diabetic medications that would be easier for this patient to manage.  Diabetes coordinator  Management as above  Start TF per nutrition  Depression Patient on venlafaxine XR 150 mg daily with breakfast. Decrease to 75 daily 2/2 renal function.   Hold off for now  HS Troponin  Elevated at 56-63.  Likely demand ischemia given patient's current status.  No consult to cardiology at this time.  HTN At home 10 mg lisinopril daily.  No medications at the time given hypotension.  Discussed with bedside RN and neurology.  Had an extensive conversation with the grandson and reviewed the patient's living will.  She did not wish for any mechanical support but is now intubated.  After a long discussion decision was made, no CPR/cardioversion/trach/peg/dialysis.  Will allow for another 24-48 hours, if not able to  come off sedation or seizure continues when off sedation the will proceed towards comfort.  Best Practice / Goals of Care / Disposition.   DVT PROPHYLAXIS:Low dose lovenox  SUP: Yes NUTRITION: NPO, nutrition consulted  MOBILITY: BR GOALS OF CARE: not  FAMILY DISCUSSIONS: Vernia Buff [phone] DISPOSITION ICU   The patient is critically ill with multiple organ systems failure and requires high complexity decision making for assessment and support, frequent evaluation and titration of therapies, application of advanced monitoring technologies and extensive interpretation of multiple databases.   Critical Care Time devoted to patient care services described in this note is  40  Minutes. This time reflects time of care of this signee Dr Jennet Maduro. This critical care time does not reflect procedure time, or teaching time or supervisory time of PA/NP/Med student/Med Resident etc but could involve care discussion time.  Rush Farmer, M.D. [facility] Pulmonary/Critical Care Medicine. Pager: [phone]. After hours pager: [phone].

## 2018-08-22 NOTE — Progress Notes (Signed)
abeLink Physician-Brief Progress Note Patient Name: FARRYN LINARES DOB: Aug 06, 1945 MRN: [id number]   Date of Service  08/22/2018  HPI/Events of Note  ABG notified showing improving NG metabolic acidosis, on bicarb gtt. hco3 15. PH 7.30. Pao2 56. Video: sats 90%. In synchrony. Vt fine.   eICU Interventions  - increase fio2 to 35% to keep sats around 95% is ok. Status epilepticus- stable. No tachy cardia.      Intervention Category Intermediate Interventions: Diagnostic test evaluation  Elmer Sow 08/22/2018, 3:56 AM

## 2018-08-22 NOTE — Progress Notes (Addendum)
eLink Physician-Brief Progress Note Patient Name: Yvonne Reynolds DOB: 1945-05-12 MRN: [id number]   Date of Service  08/22/2018  HPI/Events of Note  Being notified tonight of Calcium 5.3 from this morning, albumin 1.4 two days before. Ionized calcium 0.82 this morning as well. Bedside team had ordered Calcium 2 grams  eICU Interventions  Recheck ionized calcium with am labs     Intervention Category Major Interventions: Electrolyte abnormality - evaluation and management  Judd Lien 08/22/2018, 8:37 PM

## 2018-08-22 NOTE — Progress Notes (Signed)
CRITICAL VALUE ALERT  Critical Value:  Ca 5.3  Date & Time Notied:  08/22/18 2025  Provider Notified: Dr. Genevive Bi via [facility]  Orders Received/Actions taken: awaiting new order

## 2018-08-22 NOTE — Procedures (Addendum)
Patient Name: Yvonne Reynolds  MRN: [id number]  Epilepsy Attending: Lora Havens  Referring Physician/Provider: Dr Merrilyn Puma Duration : 08/21/2018 730am to 08/22/2018 730am  Patient history:73yo female with prior left frontal stroke who presented with ams and right arm posturing concerning for sz. EEG ordered to evaluate.  Technical Details:Long-term video-EEG monitoring was performed using standard setting per the guidelines. Briefly, a minimum of 21 electrodes were placed on scalp according to the International 10-20 or/and 10-10 Systems. Supplemental electrodes were placed as needed. Single EKG electrode was also used to detect cardiac arrhythmia. Patient's behavior was continuously recorded on video simultaneously with EEG. A minimum of 16 channels were used for data display. Each epoch of study was reviewed manually daily and as needed using standard referential and bipolar montages.   EEG Description: Background: EEG showed burst suppression seen as high amplitude delta slowing lasting 1-2 seconds followed by 10-12  seconds of background suppression  Interictal: Frequent spikes and sharp waves were seen in right frontotemporal region more than left fronto-parietal region.   Ictal: Total 53 seizuresarising from right frontotemporal region, lasting from 30-60 seconds on an average with no clear clinical signs. 52 of these seizures were recorded between 01/21/2019 730am to 2156pm. Last seizure was on 08/22/2018 at 0407 am.    Impression: ThisEEG showed patient was in status epilepticus with 52 seizures from 08/21/2018 730 am to 2156pm,arising from right frontotemporal region, each lasting 30-60 seconds with no clear clinical signs. Last seizure was on 08/22/2018 at 0407 am. In addition, there is evidence of profound encephalopathy.  Treatment provider was notified.   Mariona Scholes Barbra Sarks

## 2018-08-22 NOTE — Progress Notes (Addendum)
Reason for consult: Status epilepticus  Subjective: Patient remains intubated and medically induced coma.  Urine output is slightly improved.    TDV:VOHYWV to obtain due to  mental status  Examination  Vital signs in last 24 hours: Temp:  [96.3 F (35.7 C)-100 F (37.8 C)] 97.7 F (36.5 C) (07/26 0800) Pulse Rate:  [77-93] 81 (07/26 0800) Resp:  [18-91] 26 (07/26 0800) BP: (94-143)/(42-55) 124/49 (07/26 0800) SpO2:  [89 %-98 %] 90 % (07/26 0800) Arterial Line BP: (67-171)/(35-49) 117/38 (07/26 0800) FiO2 (%):  [30 %-35 %] 35 % (07/26 0800) Weight:  [86.8 kg] 86.8 kg (07/26 0326)  General: lying in bed CVS: pulse-normal rate and rhythm RS: breathing comfortably with assistance of ventilator Extremities: normal   Neuro: Mental Status: Patient does not respond to verbal stimuli.  Does not respond to deep sternal rub.  Does not follow commands.  No verbalizations noted.  Cranial Nerves: II: Pupils are 3 mm bilaterally nonreactive  III,IV,VI: doll's response absent bilaterally.  V,VII: corneal reflex: Absent VIII: patient does not respond to verbal stimuli IX,X: gag reflex:  absent XI: trapezius strength unable to test bilaterally XII: tongue strength unable to test Motor: Extremities flaccid throughout.  No spontaneous movement noted.  No purposeful movements noted.  Sensory: Does not respond to noxious stimuli in any extremity. Deep Tendon Reflexes:  Absent throughout. Plantars: Mute Cerebellar: Unable to perform  Basic Metabolic Panel: Recent Labs  Lab 08/20/18 0006 08/20/18 0435 08/20/18 1406 08/20/18 1416 08/21/18 0509 08/21/18 1648 08/22/18 0320 08/22/18 0700  NA 144 144 147* 148* 142  --  144 144  K 3.7 3.9 4.1 3.9 4.8  --  3.5 3.9  CL 119* 120* 123*  --  112*  --   --  109  CO2 13* 12* 13*  --  12*  --   --  16*  GLUCOSE 493* 364* 122*  --  281*  --   --  116*  BUN 83* 82* 77*  --  77*  --   --  76*  CREATININE 2.89* 2.74* 2.53*  --  2.61*  --   --   2.90*  CALCIUM 7.0* 7.0* 6.4*  --  5.8*  --   --  5.3*  MG  --  2.2 1.8  --  1.7 1.9  --  1.6*  PHOS  --  2.6 2.5  --  5.8* 4.7*  --  6.8*    CBC: Recent Labs  Lab 08/12/2018 1128  08/20/18 0006 08/20/18 0435 08/20/18 1416 08/20/18 1543 08/21/18 0509 08/22/18 0320 08/22/18 0700  WBC 14.1*  --  20.5* 19.9*  --  21.5* 23.6*  --  20.8*  NEUTROABS 11.6*  --   --  16.8*  --   --   --   --   --   HGB 10.2*   < > 12.0 12.1 11.2* 10.1* 11.2* 11.6* 9.6*  HCT 35.0*   < > 37.9 39.2 33.0* 31.4* 32.5* 34.0* 30.0*  MCV 91.6  --  85.7 87.1  --  84.6 85.3  --  83.6  PLT 268  --  168 178  --  PLATELET CLUMPS NOTED ON SMEAR, COUNT APPEARS DECREASED PLATELET CLUMPS NOTED ON SMEAR, COUNT APPEARS DECREASED  --  82*   < > = values in this interval not displayed.     Coagulation Studies: Recent Labs    08/08/2018 1231  LABPROT 18.6*  INR 1.6*     Continuous EEG report 7/26  ThisEEGshowed patientwasin  status epilepticus with 52seizures from 08/21/2018 730 am to 2156pm,arising from right frontotemporal region, each lasting 30-60 seconds with no clear clinical signs. Last seizure wason 08/22/2018 at [place] is evidence of profound encephalopathy.    ASSESSMENT AND PLAN  73 year old female with history of diabetes mellitus, hyperlipidemia, major depression, recurrent falls presents to the ED after being found unresponsive.  Patient on arrival was in DKA, hypotensive, and noted to have left gaze deviation.  Patient intubated for airway protection and EEG showed status epilepticus. CTA showed subacute to chronic infarct in the left frontal lobe. Propofol was discontinued yesterday and patient has been on Versed drip with multiple but brief break through seizures arising from the left frontotemporal region. Received a mini bolus of Dilantin after her level came back subtherapeutic, improved seizures.   I had a detailed discussion with the family along with critical care team  today.  They brought her living well but states that she would not like aggressive life-sustaining measures.  They are aware that currently she is a medically induced coma with seizure suppression.  They do not want aggressive measures such as trach PEG or dialysis.  We agreed that we will attempt to continue treating her for 1-2 more days aggressively for seizure suppression and if seizures, after which we will wean Versed.  If patient goes back into status epilepticus, then it would be reasonable to withdraw care.  Refractory Status epilepticus Subacute to Chronic Stroke Diabetic Ketoacidosis: improving  Acute renal failure secondary to rhabdomyolysis, improving slightly  Comatose on presentation  - Continue EEG monitoring Will  add Phenobarbital 64.8 mg BID, F/U levels tomorrow -Increase Versed gtt to 50 mg/hr, will titrate based on EEG -Continue Dilantin, will recheck level daily and optimize as needed  -Continue Keppra 750 mg twice daily, renal dosing -Continue Zonegran 100 mg nightly -Continue clobazam 20 mg daily -Continue to treat metabolic conditions and infection -Avoid cefepime  Other medical conditions per CCM -Acute hypoxic respiratory failure -Transaminitis -Rhabdomyolysis -Acute kidney injury -Diabetic ketoacidosis   The patient is critically ill due to status epilepticus and is a high risk for neurological deterioration from worsening seizures, respiratory failure, cardiac death, complications of intubation and ICU stay, adverse effects of multiple anesthetic agents and antiepileptic agents.  Patient care includes monitoring of vital signs, reviewing labs, examination, reviewing long-term EEG and discussion with epileptologist and critical care services, titration of drips.  I spent 45 minutes of neuro critical care time with this patient.   Yvonne Reynolds  [facility] Pager Number [phone] For questions after 7pm please refer to AMION to reach the  Neurologist on call

## 2018-08-23 ENCOUNTER — Inpatient Hospital Stay (HOSPITAL_COMMUNITY): Payer: Medicare HMO

## 2018-08-23 LAB — GLUCOSE, CAPILLARY
Glucose-Capillary: 197 mg/dL — ABNORMAL HIGH (ref 70–99)
Glucose-Capillary: 68 mg/dL — ABNORMAL LOW (ref 70–99)
Glucose-Capillary: 140 mg/dL — ABNORMAL HIGH (ref 70–99)
Glucose-Capillary: 132 mg/dL — ABNORMAL HIGH (ref 70–99)

## 2018-08-23 LAB — BASIC METABOLIC PANEL
Anion gap: 29 — ABNORMAL HIGH (ref 5–15)
BUN: 77 mg/dL — ABNORMAL HIGH (ref 8–23)
CO2: 11 mmol/L — ABNORMAL LOW (ref 22–32)
Calcium: 5.1 mg/dL — CL (ref 8.9–10.3)
Chloride: 106 mmol/L (ref 98–111)
Creatinine, Ser: 3.3 mg/dL — ABNORMAL HIGH (ref 0.44–1.00)
GFR calc Af Amer: 15 mL/min — ABNORMAL LOW (ref >60)
GFR calc non Af Amer: 13 mL/min — ABNORMAL LOW (ref >60)
Glucose, Bld: 82 mg/dL (ref 70–99)
Potassium: 4.5 mmol/L (ref 3.5–5.1)
Sodium: 146 mmol/L — ABNORMAL HIGH (ref 135–145)

## 2018-08-23 LAB — POCT I-STAT 7, (LYTES, BLD GAS, ICA,H+H)
Acid-base deficit: 17 mmol/L — ABNORMAL HIGH (ref 0.0–2.0)
Bicarbonate: 12.3 mmol/L — ABNORMAL LOW (ref 20.0–28.0)
Calcium, Ion: 0.67 mmol/L — CL (ref 1.15–1.40)
HCT: 23 % — ABNORMAL LOW (ref 36.0–46.0)
Hemoglobin: 7.8 g/dL — ABNORMAL LOW (ref 12.0–15.0)
O2 Saturation: 81 %
Patient temperature: 35.9
Potassium: 4.4 mmol/L (ref 3.5–5.1)
Sodium: 142 mmol/L (ref 135–145)
TCO2: 14 mmol/L — ABNORMAL LOW (ref 22–32)
pCO2 arterial: 41.5 mmHg (ref 32.0–48.0)
pH, Arterial: 7.074 — CL (ref 7.350–7.450)
pO2, Arterial: 58 mmHg — ABNORMAL LOW (ref 83.0–108.0)

## 2018-08-23 LAB — PHENYTOIN LEVEL, FREE AND TOTAL
Phenytoin, Free: 2.5 ug/mL — ABNORMAL HIGH (ref 1.0–2.0)
Phenytoin, Total: 7.3 ug/mL — ABNORMAL LOW (ref 10.0–20.0)

## 2018-08-23 LAB — POCT I-STAT EG7
Acid-base deficit: 17 mmol/L — ABNORMAL HIGH (ref 0.0–2.0)
Bicarbonate: 11.6 mmol/L — ABNORMAL LOW (ref 20.0–28.0)
Calcium, Ion: 1.02 mmol/L — ABNORMAL LOW (ref 1.15–1.40)
HCT: 54 % — ABNORMAL HIGH (ref 36.0–46.0)
Hemoglobin: 18.4 g/dL — ABNORMAL HIGH (ref 12.0–15.0)
O2 Saturation: 86 %
Potassium: 7.3 mmol/L (ref 3.5–5.1)
Sodium: 139 mmol/L (ref 135–145)
TCO2: 13 mmol/L — ABNORMAL LOW (ref 22–32)
pCO2, Ven: 37 mmHg — ABNORMAL LOW (ref 44.0–60.0)
pH, Ven: 7.105 — CL (ref 7.250–7.430)
pO2, Ven: 68 mmHg — ABNORMAL HIGH (ref 32.0–45.0)

## 2018-08-23 LAB — CBC
HCT: 29.6 % — ABNORMAL LOW (ref 36.0–46.0)
Hemoglobin: 9 g/dL — ABNORMAL LOW (ref 12.0–15.0)
MCH: 26.9 pg (ref 26.0–34.0)
MCHC: 30.4 g/dL (ref 30.0–36.0)
MCV: 88.6 fL (ref 80.0–100.0)
Platelets: 80 10*3/uL — ABNORMAL LOW (ref 150–400)
RBC: 3.34 MIL/uL — ABNORMAL LOW (ref 3.87–5.11)
RDW: 16.8 % — ABNORMAL HIGH (ref 11.5–15.5)
WBC: 24.4 10*3/uL — ABNORMAL HIGH (ref 4.0–10.5)
nRBC: 2.5 % — ABNORMAL HIGH (ref 0.0–0.2)

## 2018-08-23 LAB — BLOOD GAS, ARTERIAL
Acid-base deficit: 18.1 mmol/L — ABNORMAL HIGH (ref 0.0–2.0)
Bicarbonate: 8.5 mmol/L — ABNORMAL LOW (ref 20.0–28.0)
FIO2: 50
MECHVT: 380 mL
O2 Saturation: 99 %
PEEP: 5 cmH2O
Patient temperature: 99.1
RATE: 25 resp/min
pCO2 arterial: 23.1 mmHg — ABNORMAL LOW (ref 32.0–48.0)
pH, Arterial: 7.194 — CL (ref 7.350–7.450)
pO2, Arterial: 242 mmHg — ABNORMAL HIGH (ref 83.0–108.0)

## 2018-08-23 LAB — PHENYTOIN LEVEL, TOTAL: Phenytoin Lvl: 8.8 ug/mL — ABNORMAL LOW (ref 10.0–20.0)

## 2018-08-23 LAB — MAGNESIUM: Magnesium: 1.7 mg/dL (ref 1.7–2.4)

## 2018-08-23 LAB — PHOSPHORUS: Phosphorus: 9.8 mg/dL — ABNORMAL HIGH (ref 2.5–4.6)

## 2018-08-23 LAB — PHENOBARBITAL LEVEL: Phenobarbital: <5 ug/mL — ABNORMAL LOW (ref 15.0–30.0)

## 2018-08-23 MED ORDER — CHLORHEXIDINE GLUCONATE 0.12 % MT SOLN
OROMUCOSAL | Status: AC
  Filled 2018-08-23: qty 15

## 2018-08-23 MED ORDER — DEXTROSE 50 % IV SOLN
50.0000 mL | Freq: Once | INTRAVENOUS | Status: AC
  Administered 2018-08-23: 03:00:00 50 mL via INTRAVENOUS

## 2018-08-23 MED ORDER — CALCIUM GLUCONATE-NACL 2-0.675 GM/100ML-% IV SOLN
2.0000 g | Freq: Once | INTRAVENOUS | Status: AC
  Administered 2018-08-23: 03:00:00 2000 mg via INTRAVENOUS
  Filled 2018-08-23: qty 100

## 2018-08-23 MED ORDER — SODIUM BICARBONATE 8.4 % IV SOLN
INTRAVENOUS | Status: DC
  Administered 2018-08-23: 06:00:00 via INTRAVENOUS
  Filled 2018-08-23 (×3): qty 150

## 2018-08-23 MED ORDER — DEXTROSE 50 % IV SOLN
INTRAVENOUS | Status: AC
  Filled 2018-08-23: qty 50

## 2018-08-24 ENCOUNTER — Telehealth: Payer: Self-pay | Admitting: *Deleted

## 2018-08-24 LAB — CULTURE, BLOOD (ROUTINE X 2)
Culture: NO GROWTH
Culture: NO GROWTH

## 2018-08-24 NOTE — Telephone Encounter (Signed)
Received original D/C, D/C forwarded to Dr. Nelda Marseille at [facility] ICU for signature.

## 2018-08-25 LAB — CALCIUM, IONIZED: Calcium, Ionized, Serum: <3 mg/dL — ABNORMAL LOW (ref 4.5–5.6)

## 2018-08-27 NOTE — Telephone Encounter (Signed)
Received Original Signed D/C ,[facility] was notified for pick up along with faxing a copy.

## 2018-08-28 NOTE — Progress Notes (Addendum)
Called to bedside to assess patient by RN.  HR decreased int o the 50's and MAP in 40's.  Patient on maximum dose vasopressors, bicarbonate and ventilator support.  Upon entering the room the patients rhythm progressed to asystole.  No heart tones auscultated on exam, no spontaneous breath sounds.  Time of death - 75 pm. Family (grandson) notified of patients passing and requested them to visit.    Noe Gens, NP-C [facility] Pgr: [phone] or if no answer [phone] 09/14/18, 12:55 PM  Attending Note:  Patient is severely metabolically acidotic and maxed out on 3 pressors.  I do not believe she will survive at this point.  Stopped versed but continues to drop her BP.  I called her grandson.  He is in agreement that increasing anything further treatment at this point is in her best interest.  Based on that, decision is made to not increase further and grandson on the way to see her knowing she is actively dying.  Communicated with RN.  Will not increase anything further at this point and allow patient to pass peacefully and grandson is aware.  The patient is critically ill with multiple organ systems failure and requires high complexity decision making for assessment and support, frequent evaluation and titration of therapies, application of advanced monitoring technologies and extensive interpretation of multiple databases.   Critical Care Time devoted to patient care services described in this note is  35  Minutes. This time reflects time of care of this signee Dr Jennet Maduro. This critical care time does not reflect procedure time, or teaching time or supervisory time of PA/NP/Med student/Med Resident etc but could involve care discussion time.  Rush Farmer, M.D. [facility]/Critical Care Medicine. Pager: [phone]. After hours pager: [phone].

## 2018-08-28 NOTE — Progress Notes (Signed)
ABG drawn, critical results given to RN. 

## 2018-08-28 NOTE — Progress Notes (Signed)
Patient having episode of emesis. Dr. Nelda Marseille made aware and given order to connect OG tube to low intermittent suction. Also made MD aware of patients BP and HR trending down with HR in 50s and MAP in 40s with patient maxed on pressors ordered. MD gave orders to turn off versed gtt. Neurology paged to make aware of these changes. Awaiting there call back.

## 2018-08-28 NOTE — Progress Notes (Signed)
LTM discontinued; pt is deceased.No skin breakdown was seen.

## 2018-08-28 NOTE — Progress Notes (Signed)
RT note- Patient expired while on ventilator and turned off at this time for pronouncement.

## 2018-08-28 NOTE — Progress Notes (Signed)
eLink Physician-Brief Progress Note Patient Name: Yvonne Reynolds DOB: 02-02-1945 MRN: [id number]   Date of Service  05-Sep-2018  HPI/Events of Note  Notified of calcium 5.1 and episodes of hypoglycemia  eICU Interventions   Ordered calcium gluconate 2g  Change sodium bicard drip to D5W with bicarb at same rate. Hold basal insulin     Intervention Category Major Interventions: Electrolyte abnormality - evaluation and management;Other:  Judd Lien September 05, 2018, 3:26 AM

## 2018-08-28 NOTE — Progress Notes (Addendum)
CRITICAL VALUE ALERT  Critical Value:  Ca 5.1  CBG 57 & CBG 68 4hrs apart  Date & Time Notied:  06-Sep-2018 0310  Provider Notified: Dr. Genevive Bi  Orders Received/Actions taken: awaiting new orders

## 2018-08-28 NOTE — Procedures (Signed)
Patient Name:Yvonne Reynolds PHK:[id number] Epilepsy Attending:Amelie Caracci Barbra Sarks Referring Physician/Provider:Dr Praveen Mannan Duration:2018-09-17 730am to 09-17-18 1331  Patient history:73yo female with prior left frontal stroke who presented with ams and right arm posturing concerning for sz. EEG ordered to evaluate.  Technical Details:Long-term video-EEG monitoring was performed using standard setting per the guidelines. Briefly, a minimum of 21 electrodes were placed on scalp according to the International 10-20 or/and 10-10 Systems. Supplemental electrodes were placed as needed. Single EKG electrode was also used to detect cardiac arrhythmia. Patient's behavior was continuously recorded on video simultaneously with EEG. A minimum of 16 channels were used for data display. Each epoch of study was reviewed manually daily and as needed using standard referential and bipolar montages.   EEG Description: Background:EEG showed burst suppression seen as high amplitude polyspikes with delta slowing lasting 1-2 seconds followed by12-15seconds of background suppression. Around 1235, patient went into asystole and EEG showed generalized suppression.   Interictal:Rare generalized burst of spikes were seen until 1235pm  Ictal: No seizures were seen.  Impression: ThisEEGinitially showed evidence of generalized epileptogenicity as well as profound encephalopathy. No seizures were seen during this recording.   Around 1235, patient went into asystole and EEG showed diffuse suppression.   Aloma Boch Barbra Sarks

## 2018-08-28 NOTE — Procedures (Signed)
Patient Name:Yvonne Reynolds UEA:[id number] Epilepsy Attending:Female Minish Barbra Sarks Referring Physician/Provider:Dr Praveen Mannan Duration:08/22/2018 63am to 27-Aug-2018 730am  Patient history:73yo female with prior left frontal stroke who presented with ams and right arm posturing concerning for sz. EEG ordered to evaluate.  Technical Details:Long-term video-EEG monitoring was performed using standard setting per the guidelines. Briefly, a minimum of 21 electrodes were placed on scalp according to the International 10-20 or/and 10-10 Systems. Supplemental electrodes were placed as needed. Single EKG electrode was also used to detect cardiac arrhythmia. Patient's behavior was continuously recorded on video simultaneously with EEG. A minimum of 16 channels were used for data display. Each epoch of study was reviewed manually daily and as needed using standard referential and bipolar montages.   EEG Description: Background:EEG showed burst suppression seen as high amplitude polyspikes with delta slowing lasting 1-2 seconds followed by 10-12  seconds of background suppression  Interictal:Rare spikes and sharp waves were seen in right frontotemporal region more than left fronto-parietal region.   Ictal: No seizures were seen.  Impression: ThisEEGshowed evidence of generalized epileptogenicity as well as profound encephalopathy. No seizures were seen during this recording. Last seizure wason 08/22/2018 at [place] am.  Lora Havens

## 2018-08-28 NOTE — Progress Notes (Addendum)
CRITICAL VALUE ALERT  Critical Value: 7.07 pH, PaO2 58, SaO2 81%, HCO3 12.3  Date & Time Notied:  10-Sep-2018 0330  Provider Notified: Dr. Genevive Bi  Orders Received/Actions taken: Awaiting new orders

## 2018-08-28 NOTE — Progress Notes (Signed)
 Nutrition Follow-up  RD working remotely.  DOCUMENTATION CODES:   Not applicable  INTERVENTION:   Continue to hold TF; Recommend resuming TF when pt medically appropriate  Tube Feeding: Change to Vital AF 1.2 at 50 ml/hr Pro-Stat 30 mL daily Provides 105 g of protein, 1540 kcals, 972 mL of free water   NUTRITION DIAGNOSIS:   Inadequate oral intake related to inability to eat as evidenced by NPO status.  Being addressed via TF   GOAL:   Patient will meet greater than or equal to 90% of their needs  Not Met  MONITOR:   Vent status, TF tolerance, Labs, Skin  REASON FOR ASSESSMENT:   Ventilator, Consult Enteral/tube feeding initiation and management  ASSESSMENT:   73 yo female admitted with DKA, intubated on admission. CT head showed subacute chronic infarct. EEG showed status epilepticus. PMH includes seizure 2/2 hypoglycemia, HLD, osteoporosis, HTN, GERD.  Patient is currently intubated on ventilator support, currently on levophed, vasopressin and phenylephrine, MAP in 40s, HR in 50s MV: 9.1 L/min Temp (24hrs), Avg:97.6 F (36.4 C), Min:95.5 F (35.3 C), Max:100.8 F (38.2 C)  Propofol: OFF  Vital High Protein at 20 ml/hr, Pro-Stat 30 mL QID  TF held today due to vomiting, pt maxed out on pressors with MAP in 40s  Current wt 86.8 kg; admission weight 64.1 kg.   Labs: phosphorus 9.8 (H), Creatinine 3.30, BUN 77, ionized calcium 0.67 (L) Meds: reviewed  Diet Order:   Diet Order            Diet NPO time specified  Diet effective now              EDUCATION NEEDS:   No education needs have been identified at this time  Skin:  Skin Assessment: Skin Integrity Issues: Skin Integrity Issues:: DTI DTI: back and sacrum  Last BM:  PTA  Height:   Ht Readings from Last 1 Encounters:  08/08/2018 5' 1" (1.549 m)    Weight:   Wt Readings from Last 1 Encounters:  08/22/18 86.8 kg    Ideal Body Weight:  47.7 kg  BMI:  Body mass index is 36.16  kg/m.  Estimated Nutritional Needs:   Kcal:  1460  Protein:  95-110 gm  Fluid:  >/= 1.5 L    Melenda Bielak MS, RDN, LDN, CNSC [phone] Pager  [phone] Weekend/On-Call Pager

## 2018-08-28 DEATH — deceased

## 2018-09-03 DIAGNOSIS — G40901 Epilepsy, unspecified, not intractable, with status epilepticus: Secondary | ICD-10-CM

## 2018-09-28 NOTE — Death Summary Note (Signed)
DEATH SUMMARY   Patient Details  Name: Yvonne Reynolds MRN: [id number] DOB: [birth year]  Admission/Discharge Information   Admit Date:  21-Aug-2018  Date of Death: Date of Death: 08/25/18  Time of Death: Time of Death: 23-Apr-1243  Length of Stay: 4  Referring Physician: Marrian Salvage, FNP   Reason(s) for Hospitalization  Altered mental status and respiratory failure  Diagnoses  Preliminary cause of death:   Refractory shock Secondary Diagnoses (including complications and co-morbidities):  Active Problems:   Altered mental state   Diabetic ketoacidosis with coma associated with type 2 diabetes mellitus ([place])   DKA (diabetic ketoacidoses) ([facility])   Pressure injury of skin   AKI (acute kidney injury) ([place])   Status epilepticus ([place]) CVA Acute respiratory failure, hypoxemic  Brief Hospital Course (including significant findings, care, treatment, and services provided and events leading to death)  Yvonne Reynolds is a 73 y.o. female who presented to the ED via EMS and was placed on vent on arrival.  Patient was cared for in the ICU for four days but made no significant improvement.  On her day of death, the patient is severely metabolically acidotic and maxed out on 3 pressors.  I do not believe she will survive at this point.  Stopped versed but continues to drop her BP.  I called her grandson.  He is in agreement that increasing anything further treatment at this point is in her best interest.  Based on that, decision is made to not increase further and grandson on the way to see her knowing she is actively dying.  Communicated with RN.  Will not increase anything further at this point and allow patient to pass peacefully and grandson is aware.  Patient expired shortly thereafter on the ventilator.  Family notified by nursing staff.    Pertinent Labs and Studies  Significant Diagnostic Studies Ct Head Wo Contrast  Result Date: 08/21/18 CLINICAL DATA:  Altered mental status  EXAM: CT HEAD WITHOUT CONTRAST CT CERVICAL SPINE WITHOUT CONTRAST TECHNIQUE: Multidetector CT imaging of the head and cervical spine was performed following the standard protocol without intravenous contrast. Multiplanar CT image reconstructions of the cervical spine were also generated. COMPARISON:  06/18/2009 FINDINGS: CT HEAD FINDINGS Brain: Mild atrophic changes are noted. Chronic white matter ischemic changes are seen. There is a focal area of decreased attenuation in the distribution of the left MCA without significant mass effect likely representing subacute to chronic ischemia. No findings to suggest acute hemorrhage or space-occupying mass lesion are noted. Vascular: No hyperdense vessel or unexpected calcification. Skull: Normal. Negative for fracture or focal lesion. Sinuses/Orbits: No acute finding. Other: None. CT CERVICAL SPINE FINDINGS Alignment: Within normal limits. Skull base and vertebrae: 7 cervical segments are well visualized. Vertebral body height is well maintained. Multilevel osteophytic changes and facet hypertrophic changes are noted left greater than right. No acute fracture or acute facet abnormality is seen. The odontoid is within normal limits. Soft tissues and spinal canal: Surrounding soft tissue structures are within normal limits. Endotracheal tube and gastric catheter are seen. Upper chest: Visualized lung apices are within normal limits. Other: None IMPRESSION: CT of the head: Chronic atrophic and ischemic changes are noted. Changes in the distribution of the left MCA consistent with subacute to chronic ischemia with mild cephalization. No acute infarct is noted. CT of cervical spine: Multilevel degenerative changes without acute abnormality. Electronically Signed   By: Inez Catalina M.D.   On: Aug 21, 2018 16:11   Ct Cervical Spine Wo  Contrast  Result Date: 08/13/2018 CLINICAL DATA:  Altered mental status EXAM: CT HEAD WITHOUT CONTRAST CT CERVICAL SPINE WITHOUT CONTRAST  TECHNIQUE: Multidetector CT imaging of the head and cervical spine was performed following the standard protocol without intravenous contrast. Multiplanar CT image reconstructions of the cervical spine were also generated. COMPARISON:  06/18/2009 FINDINGS: CT HEAD FINDINGS Brain: Mild atrophic changes are noted. Chronic white matter ischemic changes are seen. There is a focal area of decreased attenuation in the distribution of the left MCA without significant mass effect likely representing subacute to chronic ischemia. No findings to suggest acute hemorrhage or space-occupying mass lesion are noted. Vascular: No hyperdense vessel or unexpected calcification. Skull: Normal. Negative for fracture or focal lesion. Sinuses/Orbits: No acute finding. Other: None. CT CERVICAL SPINE FINDINGS Alignment: Within normal limits. Skull base and vertebrae: 7 cervical segments are well visualized. Vertebral body height is well maintained. Multilevel osteophytic changes and facet hypertrophic changes are noted left greater than right. No acute fracture or acute facet abnormality is seen. The odontoid is within normal limits. Soft tissues and spinal canal: Surrounding soft tissue structures are within normal limits. Endotracheal tube and gastric catheter are seen. Upper chest: Visualized lung apices are within normal limits. Other: None IMPRESSION: CT of the head: Chronic atrophic and ischemic changes are noted. Changes in the distribution of the left MCA consistent with subacute to chronic ischemia with mild cephalization. No acute infarct is noted. CT of cervical spine: Multilevel degenerative changes without acute abnormality. Electronically Signed   By: Inez Catalina M.D.   On: 08/25/2018 16:11   US Abdomen Complete  Result Date: 08/20/2018 CLINICAL DATA:  Elevated liver function tests.  Acute renal failure. EXAM: ABDOMEN ULTRASOUND COMPLETE COMPARISON:  None. FINDINGS: Gallbladder: No gallstones or wall thickening  visualized. The patient is on a ventilator and unresponsive. Sonographic Murphy's sign therefore cannot be evaluated. Common bile duct: Diameter: 4.2 mm, normal. Liver: No focal lesion identified. Diffuse increased echogenicity of the liver parenchyma without a focal lesion, consistent with hepatic steatosis. Portal vein is patent on color Doppler imaging with normal direction of blood flow towards the liver. IVC: No abnormality visualized. The infrahepatic portion is obscured by bowel. Pancreas: Visualized portion unremarkable. Spleen: Not visualized. Right Kidney: Length: 11.0 cm. Echogenicity within normal limits. No mass or hydronephrosis visualized. Left Kidney: Length: 11.8 cm. Echogenicity within normal limits. No mass or hydronephrosis visualized. Abdominal aorta: No aneurysm visualized. Distal aorta is obscured by bowel gas. Other findings: Small amount of ascites. IMPRESSION: 1. Small amount of ascites. 2. Hepatic steatosis. 3. No other significant abnormality. Electronically Signed   By: Lorriane Shire M.D.   On: 08/20/2018 16:48   Dg Chest Port 1 View  Result Date: 2018-09-19 CLINICAL DATA:  Intubation. EXAM: PORTABLE CHEST 1 VIEW COMPARISON:  08/22/2018. FINDINGS: Endotracheal tube, NG tube, right IJ line in stable position. Heart size stable. Diffuse bilateral pulmonary infiltrates/edema and bilateral pleural effusions. No pneumothorax. IMPRESSION: 1.  Lines and tubes stable position. 2. Cardiomegaly with diffuse bilateral pulmonary infiltrates/edema bilateral pleural effusions. Findings have progressed from prior exam. Electronically Signed   By: [place]   On: 09/19/18 05:56   Dg Chest Port 1 View  Result Date: 08/22/2018 CLINICAL DATA:  73 year old female with history of endotracheal tube placement. EXAM: PORTABLE CHEST 1 VIEW COMPARISON:  Chest x-ray 08/12/2018. FINDINGS: There is a right-sided internal jugular central venous catheter with tip terminating in the distal superior  vena cava. An endotracheal tube is in place  with tip 2.2 cm above the carina. Nasogastric tube extending into the distal stomach. Lung volumes are normal. Small to moderate bilateral pleural effusions. Bibasilar opacities favored to reflect subsegmental atelectasis, although underlying airspace consolidation is difficult to exclude, particularly in the left lower lobe. No evidence of pulmonary edema. Heart size is borderline enlarged. Upper mediastinal contours are within normal limits. Aortic atherosclerosis. IMPRESSION: 1. Support apparatus, as above. 2. Increasing small to moderate bilateral pleural effusions lying dependently with some bibasilar subsegmental atelectasis. 3. Aortic atherosclerosis. Electronically Signed   By: Vinnie Langton M.D.   On: 08/22/2018 07:21   Dg Chest Port 1 View  Result Date: 08/06/2018 CLINICAL DATA:  Status post central line placement EXAM: PORTABLE CHEST 1 VIEW COMPARISON:  07/30/2018 FINDINGS: Endotracheal tube and gastric catheter are again seen and stable. New right jugular central line is noted in the mid superior vena cava. No pneumothorax is seen. Cardiac shadow is stable. Lungs are clear bilaterally. IMPRESSION: No pneumothorax following central line placement. Electronically Signed   By: Inez Catalina M.D.   On: 08/03/2018 18:49   Dg Chest Port 1 View  Result Date: 08/18/2018 CLINICAL DATA:  Respiratory failure EXAM: PORTABLE CHEST 1 VIEW COMPARISON:  02/23/2017 FINDINGS: Endotracheal tube terminates approximately 3.2 cm superior to the carina. Enteric tube courses below the diaphragm, distal tip beyond the inferior margin of the film. Cardiomediastinal silhouette is within normal limits. No focal airspace consolidation, pleural effusion, or pneumothorax. IMPRESSION: Satisfactory positioning of endotracheal and enteric tubes. No acute findings. Electronically Signed   By: Davina Poke M.D.   On: 08/03/2018 12:45    Microbiology No results found for this or  any previous visit (from the past 240 hour(s)).  Lab Basic Metabolic Panel: No results for input(s): NA, K, CL, CO2, GLUCOSE, BUN, CREATININE, CALCIUM, MG, PHOS in the last 168 hours. Liver Function Tests: No results for input(s): AST, ALT, ALKPHOS, BILITOT, PROT, ALBUMIN in the last 168 hours. No results for input(s): LIPASE, AMYLASE in the last 168 hours. No results for input(s): AMMONIA in the last 168 hours. CBC: No results for input(s): WBC, NEUTROABS, HGB, HCT, MCV, PLT in the last 168 hours. Cardiac Enzymes: No results for input(s): CKTOTAL, CKMB, CKMBINDEX, TROPONINI in the last 168 hours. Sepsis Labs: No results for input(s): PROCALCITON, WBC, LATICACIDVEN in the last 168 hours.  Procedures/Operations     Jennet Maduro 09/05/2018, 4:43 PM

## 2019-09-22 IMAGING — US ULTRASOUND ABDOMEN COMPLETE
1 series · 13 of 25 positions shown · non-contrast
Comparison: None.

CLINICAL DATA: Elevated liver function tests.  Acute renal failure.

EXAM:
ABDOMEN ULTRASOUND COMPLETE

[Series 1: ultrasound abdomen complete · 13 of 82 slices shown]
[im 1/82]
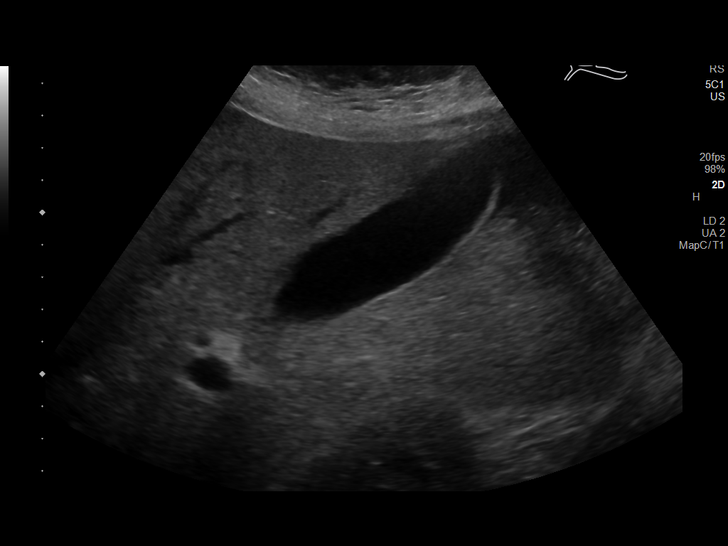
[im 7/82]
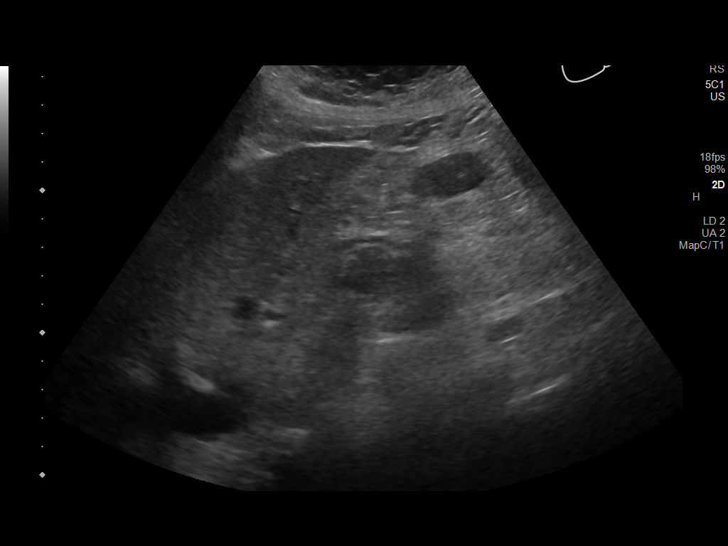
[im 14/82]
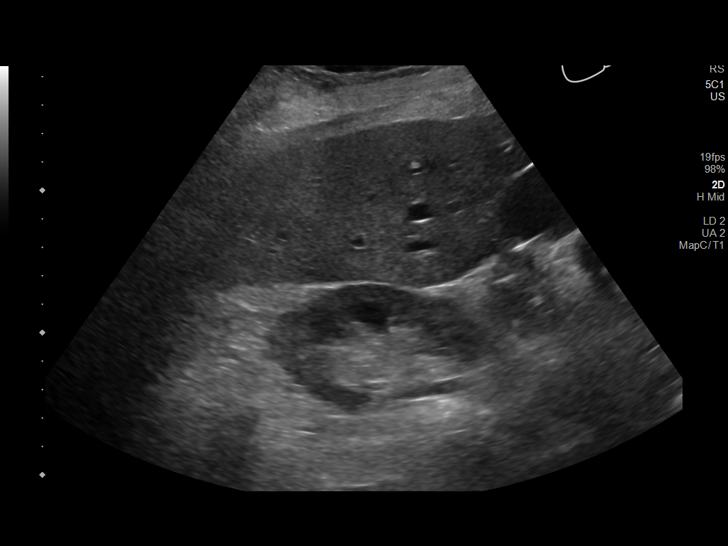
[im 21/82]
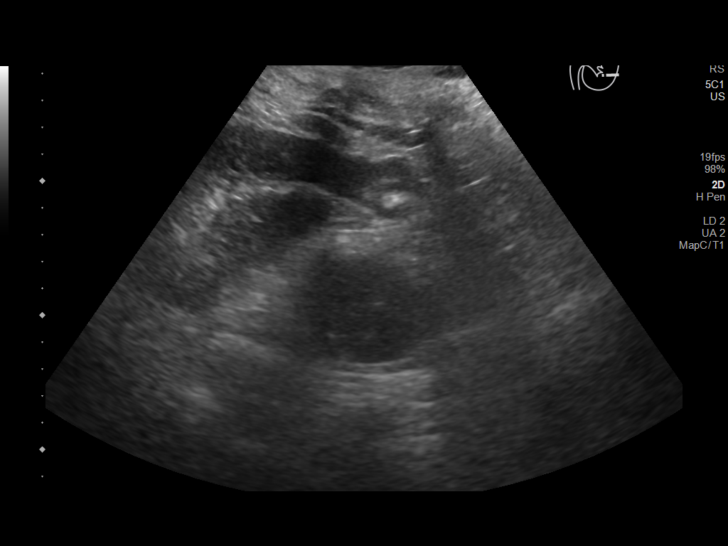
[im 28/82]
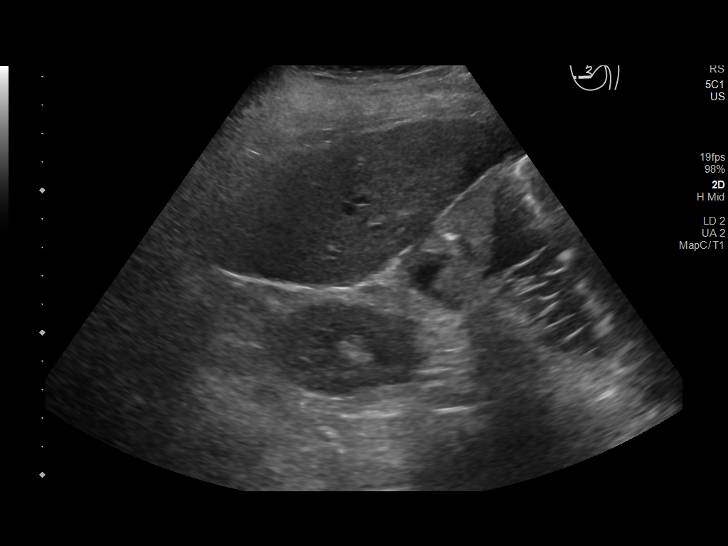
[im 34/82]
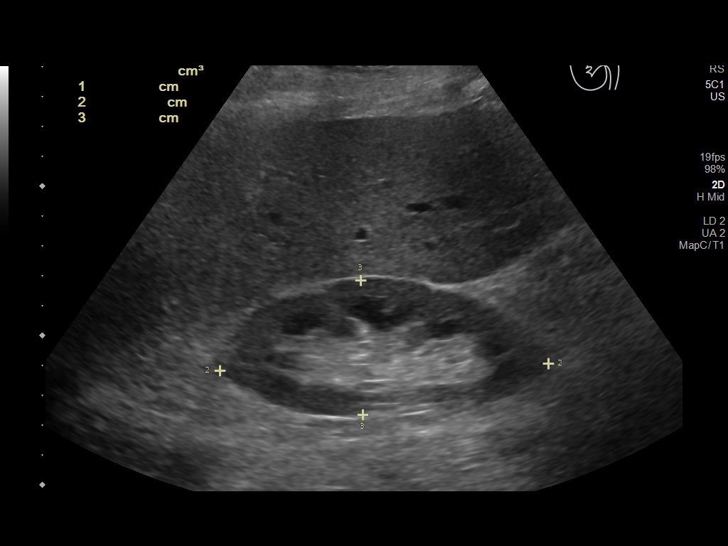
[im 41/82]
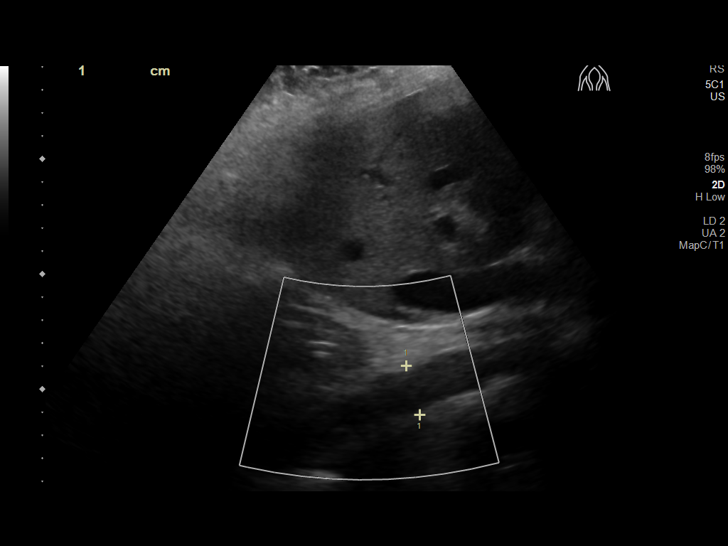
[im 48/82]
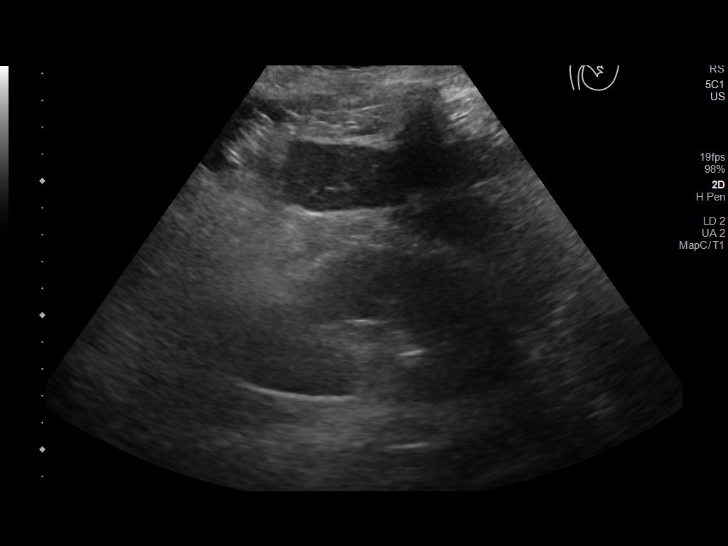
[im 55/82]
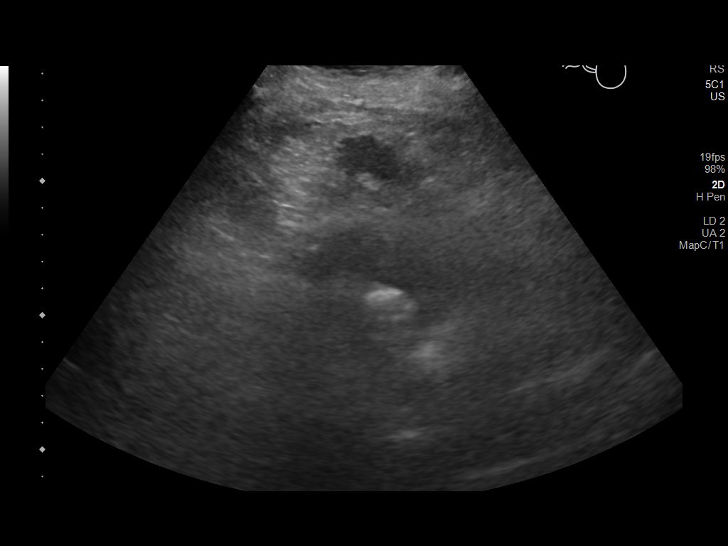
[im 61/82]
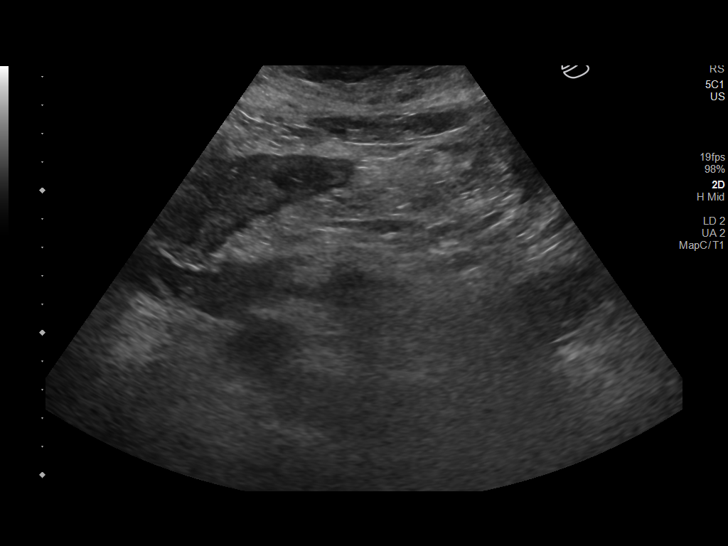
[im 68/82]
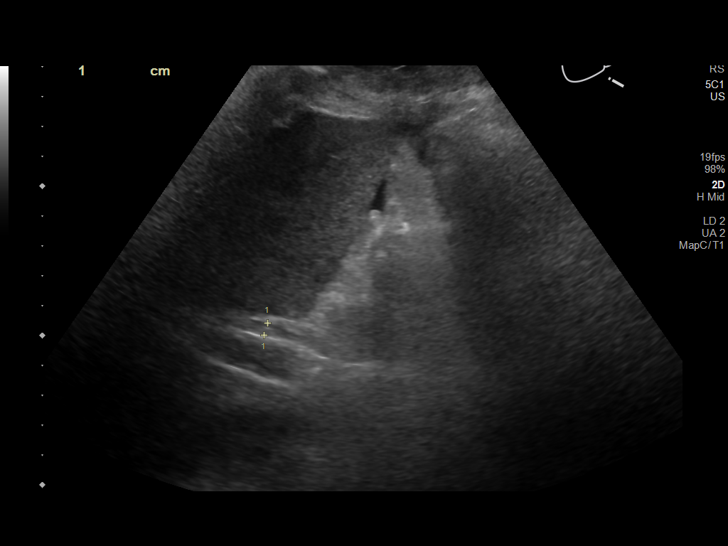
[im 75/82]
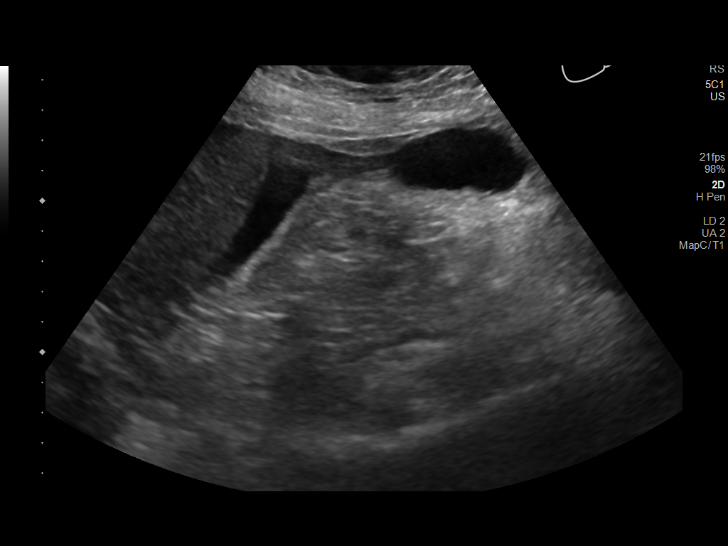
[im 82/82]
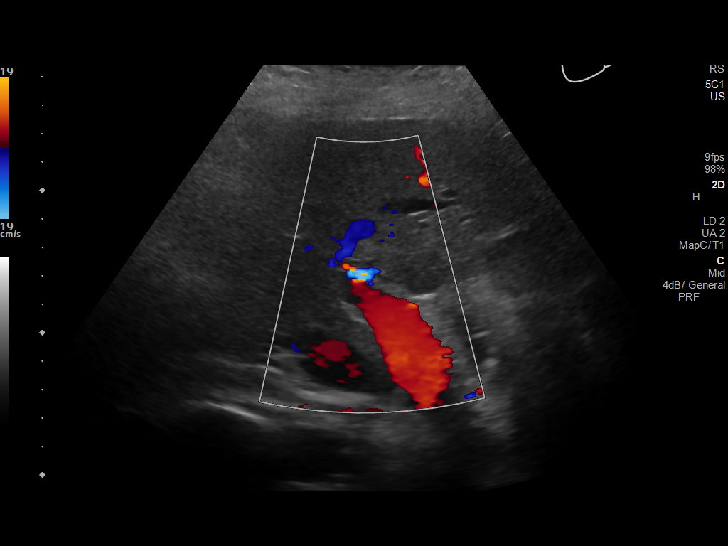

[13 of 25 positions shown; findings below may reference images not displayed]

FINDINGS: Gallbladder: No gallstones or wall thickening visualized. The
patient is on a ventilator and unresponsive. Sonographic Murphy's
sign therefore cannot be evaluated.

Common bile duct: Diameter: 4.2 mm, normal.

Liver: No focal lesion identified. Diffuse increased echogenicity of
the liver parenchyma without a focal lesion, consistent with hepatic
steatosis. Portal vein is patent on color Doppler imaging with
normal direction of blood flow towards the liver.

IVC: No abnormality visualized. The infrahepatic portion is obscured
by bowel.

Pancreas: Visualized portion unremarkable.

Spleen: Not visualized.

Right Kidney: Length: 11.0 cm. Echogenicity within normal limits. No
mass or hydronephrosis visualized.

Left Kidney: Length: 11.8 cm. Echogenicity within normal limits. No
mass or hydronephrosis visualized.

Abdominal aorta: No aneurysm visualized. Distal aorta is obscured by
bowel gas.

Other findings: Small amount of ascites.
IMPRESSION: 1. Small amount of ascites.
2. Hepatic steatosis.
3. No other significant abnormality.
